# Patient Record
Sex: Female | Born: 1995 | Race: White | Hispanic: No | Marital: Single | Smoking: Never smoker
Health system: Southern US, Community
[De-identification: ages and names within clinical notes are randomized; demographics above are authoritative.]

## PROBLEM LIST (undated history)

## (undated) DIAGNOSIS — M419 Scoliosis, unspecified: Secondary | ICD-10-CM

## (undated) DIAGNOSIS — R509 Fever, unspecified: Secondary | ICD-10-CM

## (undated) DIAGNOSIS — G40909 Epilepsy, unspecified, not intractable, without status epilepticus: Secondary | ICD-10-CM

## (undated) DIAGNOSIS — Z151 Genetic susceptibility to epilepsy and neurodevelopmental disorders: Secondary | ICD-10-CM

## (undated) DIAGNOSIS — Z931 Gastrostomy status: Secondary | ICD-10-CM

## (undated) DIAGNOSIS — S42309A Unspecified fracture of shaft of humerus, unspecified arm, initial encounter for closed fracture: Secondary | ICD-10-CM

## (undated) DIAGNOSIS — R569 Unspecified convulsions: Secondary | ICD-10-CM

## (undated) DIAGNOSIS — R634 Abnormal weight loss: Secondary | ICD-10-CM

## (undated) DIAGNOSIS — J189 Pneumonia, unspecified organism: Secondary | ICD-10-CM

## (undated) DIAGNOSIS — F842 Rett's syndrome: Secondary | ICD-10-CM

## (undated) HISTORY — DX: Rett's syndrome: F84.2

## (undated) HISTORY — DX: Unspecified fracture of shaft of humerus, unspecified arm, initial encounter for closed fracture: S42.309A

## (undated) HISTORY — DX: Unspecified convulsions: R56.9

## (undated) HISTORY — PX: SPINE SURGERY: SHX786

## (undated) HISTORY — DX: Genetic susceptibility to epilepsy and neurodevelopmental disorders: Z15.1

## (undated) HISTORY — DX: Gastrostomy status: Z93.1

## (undated) HISTORY — PX: BACK SURGERY: SHX140

## (undated) HISTORY — PX: GASTROSTOMY TUBE PLACEMENT: SHX655

## (undated) HISTORY — DX: Fever, unspecified: R50.9

## (undated) HISTORY — DX: Abnormal weight loss: R63.4

## (undated) HISTORY — DX: Epilepsy, unspecified, not intractable, without status epilepticus: G40.909

## (undated) HISTORY — DX: Pneumonia, unspecified organism: J18.9

## (undated) HISTORY — DX: Scoliosis, unspecified: M41.9

## (undated) HISTORY — PX: SPINAL GROWTH RODS: SHX2427

---
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
## 2015-02-15 ENCOUNTER — Ambulatory Visit (INDEPENDENT_AMBULATORY_CARE_PROVIDER_SITE_OTHER): Payer: Medicaid Other | Admitting: Pediatrics

## 2015-02-15 ENCOUNTER — Encounter: Payer: Self-pay | Admitting: Pediatrics

## 2015-02-15 VITALS — Wt 83.0 lb

## 2015-02-15 DIAGNOSIS — F842 Rett's syndrome: Secondary | ICD-10-CM | POA: Diagnosis not present

## 2015-02-15 DIAGNOSIS — Z79899 Other long term (current) drug therapy: Secondary | ICD-10-CM | POA: Diagnosis not present

## 2015-02-15 DIAGNOSIS — G479 Sleep disorder, unspecified: Secondary | ICD-10-CM | POA: Diagnosis not present

## 2015-02-15 DIAGNOSIS — Z72820 Sleep deprivation: Secondary | ICD-10-CM | POA: Diagnosis not present

## 2015-02-15 DIAGNOSIS — R569 Unspecified convulsions: Secondary | ICD-10-CM

## 2015-02-15 NOTE — Progress Notes (Addendum)
Subjective:     Patient ID: Jo Matthews, female   DOB: 1996-09-26, 19 y.o.   MRN: [id number]  HPI Clonazepam 0.25 mg tab TID Divalproex 3 caps AM, 4 caps Lunch, 4 caps at night Lacosamide 200 mg BID Levitiracetam 1500 mg BID Diazepam 12.5 mg rectal gel as needed  Like a sleep apnea patient Oxygen order at night Seems over-medicated Seizure quality/type has changed (jerk, slump, stare, seems to exhaust her) "Like an electrical storm in her brain, but less physical movement" Seems to be having more episode of seizure activity than before medication changes  Review of Systems  Constitutional: Positive for activity change. Negative for fever.  Respiratory: Negative.   Gastrointestinal: Negative.   Neurological: Positive for seizures.     Objective:   Physical Exam Deferred Though did make observation throughout encounter that when patient nodded off to sleep (repeatedly) she would slump with her head down and to her left.  After a short period of time, her pulse Ox reading would begin to fall into the mid to upper 80's.  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    Plan:     Referral to Dr. Vickey Hugerohmeier for home sleep study, possible sleep apnea     Our office will call once initial arrangements are made Referral to other Neurologist for second opinion on medication regiment and plan     Research your preference, I will also look into the options     When you have made a decision, we can make the official referral  Need more of a focus on Palliative care (introduced idea of [facility], need to think at tertiary level as well)  Increase in Vimpat on  02/14/15 (use the following to assess response to change in dose) 1. Frequency of seizures 2. Intensity of seizures 3. Duration of seizure 4. Baseline mental status in between 5. Pattern of pulse ox measurements, use of increased O2 flow  Statement regarding patient's mental status as pertains to her feeding needs and status: Due to her baseline altered mental status secondary to Rett's, poorly controlled seizure disorder, and fact that she is made even sleepier by ant-epileptic medications, it is NOT safe for Jo Burtonmily to eat by mouth.  To feed her by mouth places her at high risk of aspiration of food (secondary to baseline dysphagia secondary to Rett's and other factors listed above) with subsequent pneumonia.  Such events are potentially fatal for such patients.  As a result, it is strongly recommended that Jo Burtonmily continue to feed by G-tube only.  Total time = 38 minutes, >50% face to face

## 2015-02-15 NOTE — Patient Instructions (Signed)
Referral to Dr. Vickey Hugerohmeier for home sleep study, possible sleep apnea     Our office will call once initial arrangements are made Referral to other Neurologist for second opinion on medication regiment and plan     Research your preference, I will also look into the options     When you have made a decision, we can make the official referral  Need more of a focus on Palliative care  Increase in Vimpat on 02/14/15: 1. Frequency of seizures 2. Intensity 3. Duration 4. Baseline in between 5. Pattern of pulse ox measurements, use of increased O2 flow

## 2015-02-16 DIAGNOSIS — Z79899 Other long term (current) drug therapy: Secondary | ICD-10-CM | POA: Insufficient documentation

## 2015-02-17 NOTE — Addendum Note (Signed)
Addended by: Saul FordyceLOWE, Kylen Ismael M on: 02/17/2015 12:17 PM   Modules accepted: Orders

## 2015-02-19 ENCOUNTER — Emergency Department (HOSPITAL_COMMUNITY)
Admission: EM | Admit: 2015-02-19 | Discharge: 2015-02-19 | Disposition: A | Discharge status: Home / Self Care | Payer: Medicaid Other | Attending: Emergency Medicine | Admitting: Emergency Medicine

## 2015-02-19 ENCOUNTER — Emergency Department (HOSPITAL_COMMUNITY): Payer: Medicaid Other

## 2015-02-19 ENCOUNTER — Encounter (HOSPITAL_COMMUNITY): Payer: Self-pay | Admitting: Emergency Medicine

## 2015-02-19 DIAGNOSIS — Z8739 Personal history of other diseases of the musculoskeletal system and connective tissue: Secondary | ICD-10-CM | POA: Diagnosis not present

## 2015-02-19 DIAGNOSIS — G40909 Epilepsy, unspecified, not intractable, without status epilepticus: Secondary | ICD-10-CM | POA: Insufficient documentation

## 2015-02-19 DIAGNOSIS — Z8659 Personal history of other mental and behavioral disorders: Secondary | ICD-10-CM | POA: Diagnosis not present

## 2015-02-19 DIAGNOSIS — Z79899 Other long term (current) drug therapy: Secondary | ICD-10-CM | POA: Diagnosis not present

## 2015-02-19 DIAGNOSIS — Z8701 Personal history of pneumonia (recurrent): Secondary | ICD-10-CM | POA: Diagnosis not present

## 2015-02-19 DIAGNOSIS — Z880 Allergy status to penicillin: Secondary | ICD-10-CM | POA: Diagnosis not present

## 2015-02-19 MED ORDER — LORAZEPAM 1 MG PO TABS: 0.5000 mg | ORAL_TABLET | Freq: Two times a day (BID) | ORAL | Status: DC

## 2015-02-19 NOTE — ED Provider Notes (Signed)
CSN: [id number]     Arrival date & time 02/19/15  1044 History   First MD Initiated Contact with Patient 02/19/15 1103     Chief Complaint  Patient presents with  . Seizures     (Consider location/radiation/quality/duration/timing/severity/associated sxs/prior Treatment) HPI The patient has a severe, chronic seizure disorder. She was admitted to [facility] for observation 4 days ago. This morning she was noted to be moving her hand in a somewhat rhythmic pattern and not as alert as her baseline. The quality of her seizures have changed since being started on Vimpat per her mother and nurse provider. She used to have generalized tonic clonic seizures however now they've become much more subtle due to the medications. She has not had a fever as of the past several days. Her mother reports however she does have periodic fever and association with her underlying neurologic dysfunction. This has been occurring sporadically since 2013. They deny history of recurrent pneumonia or UTI. She did just have diagnostic studies done during her admission and reportedly this was negative. Her mother reports her baseline function is approximately that of a 28-month-old infant. She is nonverbal and nonambulatory. Past Medical History  Diagnosis Date  . Rett's syndrome   . Seizure disorder   . G tube feedings   . Scoliosis   . Seizures   . Weight loss   . Fracture, humerus closed   . Recurrent fever of unknown cause 08/31/2012    Fever to 104-105 lasting 24 hrs, occuring once a week for the past 5 weeks (onset Sept 2013)  . Pneumonia    Past Surgical History  Procedure Laterality Date  . Gastrostomy tube placement    . Spinal growth rods    . Back surgery     No family history on file. History  Substance Use Topics  . Smoking status: Never Smoker   . Smokeless tobacco: Never Used  . Alcohol Use: Not on file   OB History    No data available     Review of Systems   Patient cannot provide  review of systems. Per the mother and the care provider there have not been significant changes in terms of coughing, vomiting, diarrhea or skin rash. Allergies  Onfi and Penicillins  Home Medications   Prior to Admission medications   Medication Sig Start Date End Date Taking? Authorizing Provider  acetaminophen (TYLENOL) 160 MG/5ML suspension Take 560 mg by mouth every 6 (six) hours as needed for moderate pain.   Yes Historical Provider, MD  aluminum-magnesium hydroxide-simethicone (MAALOX) 200-200-20 MG/5ML SUSP Take 20 mLs by mouth every other day as needed.   Yes Historical Provider, MD  cetirizine (ZYRTEC) 1 MG/ML syrup Take 10 mLs by mouth daily. 02/08/15  Yes Historical Provider, MD  clonazePAM (KLONOPIN) 0.25 MG disintegrating tablet Take 1 tablet by mouth 3 (three) times daily. 02/01/15  Yes Historical Provider, MD  diazepam (DIASAT) 20 MG GEL Place 12.5 mg rectally daily as needed (seizure). For seizures greater than 5 minutes 02/14/15  Yes Historical Provider, MD  divalproex (DEPAKOTE SPRINKLE) 125 MG capsule Take 3 caps in the morning, 3 capsules at lunch, and 4 capsules in the evening Patient taking differently: Take 375 mg by mouth every morning. Take 3 caps in the morning at 8 am. 01/09/14  Yes Preston Fleeting, MD  divalproex (DEPAKOTE) 500 MG DR tablet Take 2,000 mg by mouth 2 (two) times daily. Take 4 capsules (2000 mg) at 1 pm and Take 4 capsules (  2000 mg) at 8pm.   Yes Historical Provider, MD  guaifenesin (ROBITUSSIN) 100 MG/5ML syrup Take 300 mg by mouth 3 (three) times daily as needed for cough.   Yes Historical Provider, MD  lacosamide (VIMPAT) 200 MG TABS tablet Place 200 mg into feeding tube 2 (two) times daily. 02/14/15  Yes Historical Provider, MD  levETIRAcetam (KEPPRA) 100 MG/ML solution Place 1,500 mg into feeding tube 2 (two) times daily.  10/24/13  Yes Historical Provider, MD  mupirocin cream (BACTROBAN) 2 % Apply 1 application topically daily as needed (irritation).     Yes Historical Provider, MD  nystatin cream (MYCOSTATIN) Apply topically 2 (two) times daily. Apply thin layer each diaper change as needed for diaper rash until rash gone 02/15/14  Yes Preston Fleeting, MD  Pediatric Multivit-Minerals-C (CHEWABLES MULTIVITAMIN) CHEW Chew 1 tablet by mouth daily.   Yes Historical Provider, MD  polyethylene glycol powder (GLYCOLAX/MIRALAX) powder Take 17 g by mouth daily. Patient taking differently: Take 8.5 g by mouth daily.  02/24/14  Yes Preston Fleeting, MD  sodium chloride (OCEAN) 0.65 % SOLN nasal spray Place 1 spray into both nostrils as needed for congestion.   Yes Historical Provider, MD  LORazepam (ATIVAN) 1 MG tablet Take 0.5 tablets (0.5 mg total) by mouth 2 (two) times daily. 02/19/15   Arby Barrette, MD  Multiple Vitamins-Minerals (MULTIVITAL) CHEW Chew 1 tablet by mouth every morning. 09/09/13 12/04/14  Georgiann Hahn, MD   BP 132/88 mmHg  Pulse 100  Resp 14  SpO2 98%  LMP  Physical Exam  Constitutional:  The patient has the appearance of developmental anomaly. Her extremities are atrophic and have some extension contracture of the lower extremity and flexion contracture of the hands. She is not in any respiratory distress. Her color is good.  HENT:  Head: Normocephalic and atraumatic.  Nose: Nose normal.  Mouth/Throat: No oropharyngeal exudate.  Eyes: EOM are normal. Pupils are equal, round, and reactive to light.  Neck: Neck supple.  Cardiovascular: Normal rate, regular rhythm, normal heart sounds and intact distal pulses.   Pulmonary/Chest: Effort normal and breath sounds normal.  Abdominal: Soft. Bowel sounds are normal. She exhibits no distension. There is no tenderness.  Musculoskeletal: She exhibits no edema or tenderness.  Neurological:  The patient has a profoundly abnormal neurologic examination. She does not appear to be having an active seizure during examination. She is slightly sleepy. She occasionally makes vocalizations. He cannot  follow any commands. Extremities indicate limited purposeful control with some ability to move the upper extremities about however the hands are contracted. The lower extremities are shortened and have extension contractures of the feet.  Skin: Skin is warm and dry.    ED Course  Procedures (including critical care time) Labs Review Labs Reviewed  COMPREHENSIVE METABOLIC PANEL  CBC WITH DIFFERENTIAL/PLATELET  URINALYSIS, ROUTINE W REFLEX MICROSCOPIC  VALPROIC ACID LEVEL  LEVETIRACETAM LEVEL    Imaging Review Dg Chest 1 View  02/19/2015   CLINICAL DATA:  Hypoxia.  Rett syndrome  EXAM: CHEST  1 VIEW  COMPARISON:  December 07, 2014  FINDINGS: There is no edema or consolidation. Heart size and pulmonary vascularity are normal. No adenopathy. There is thoracolumbar dextroscoliosis with fixation rod placement present.  IMPRESSION: No edema or consolidation.   Electronically Signed   By: Bretta Bang III M.D.   On: 02/19/2015 12:27     EKG Interpretation None       Recheck 14:55 the patient is at her baseline. The  parents are feeding her and interacting. She is awake and smiles and gazes about at family members. Consultation: Patient's case was reviewed with [facility] Shelda [facility]tesPopely at [facility]. He is familiar with the patient and has been involved with her care. At this point with the patient not having any active seizure and back to baseline, he suggested addition of Ativan 0.5 mg twice daily for 3 days. The office will be in contact with the parents regarding ongoing diagnostic studies and coordination of care with a neurologist specializing in the patient's condition who is in [place]. 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 She does have extensive neurology consultation and management. [facility] was [facility]. Today the seizure activity sounds  fairly subtle by description. Due to this fact the neurologist that the family is working with in [place] suggests a continuous outpatient monitoring EEG. I have discussed this with Dr. Shelda AltesPopely and the plan will be for [facility] to establish direct contact with the [facility] specialist and moved towards this plan of continuous EEG monitoring. At this time however, per discussion with Dr. Shelda AltesPopely, the patient is appropriate for discharge as she is back at baseline and ongoing outpatient evaluation and diagnostic testing.   Arby BarretteMarcy Gionni Vaca, MD 02/19/15 1515

## 2015-02-19 NOTE — ED Notes (Addendum)
Per EMS: pt had 45 min seizure per home health nurse, post-ictal one EMS arrival. CBG 96. Gave Diastat at 0854.

## 2015-02-19 NOTE — Discharge Instructions (Signed)

## 2015-02-19 NOTE — ED Notes (Signed)
Nurse is going to get labs.

## 2015-02-19 NOTE — ED Notes (Signed)
Bed: [id number] Expected date:  Expected time:  Means of arrival:  Comments: Seizure x "45 mins"

## 2015-02-19 NOTE — ED Notes (Signed)
Unable to get labs through IV, CN made aware and ultrasound will be attempted.

## 2015-02-19 NOTE — ED Notes (Signed)
Mother states urinalysis was just done on Tuesday and was negative. Does not want child to be cath again.

## 2015-02-27 ENCOUNTER — Telehealth: Payer: Self-pay | Admitting: Pediatrics

## 2015-02-27 NOTE — Telephone Encounter (Signed)
Mom needs to talk to you about a report she is looking for.

## 2015-03-09 ENCOUNTER — Other Ambulatory Visit: Payer: Self-pay | Admitting: Pediatrics

## 2015-03-19 ENCOUNTER — Institutional Professional Consult (permissible substitution): Payer: Medicaid Other | Admitting: Neurology

## 2015-03-22 ENCOUNTER — Telehealth: Payer: Self-pay

## 2015-03-22 NOTE — Telephone Encounter (Signed)
Mother called stating that patient is having an odd eye movement. Mother denied any other symptoms. Per Home Nurse patients vitals were good. Per dr .Ane Paymenthooker informed mother , its concerning for seizures but if vitals are not dipping it may just be an odd behavior. Per dr.hooker keep an eye on patient and call neurologist to make sure theres nothing else that we have to do.

## 2015-03-22 NOTE — Telephone Encounter (Signed)
Agree with advice as given.

## 2015-04-03 ENCOUNTER — Telehealth: Payer: Self-pay | Admitting: Pediatrics

## 2015-04-03 NOTE — Telephone Encounter (Signed)
[facility] needs documents for need of supplies for G tube to continue Fax to [phone]

## 2015-04-04 NOTE — Telephone Encounter (Signed)
Crystal, can you ask Dr. Ardyth Manam or Larita FifeLynn to take care of this request for me?  I am not going to be in the office today because Valentina GuLucy will be having a bronchoscopy [facility] in [place] to see if an aspirated foreign body has been the cause of her prolonged respiratory problems.  I am also not going to be in Thursday or Friday due to [employer]her's stepfather[place].  I am really sorry to inconvenience everyone, but I don't think this request can wait until I get back in the office.  Thanks.

## 2015-04-09 ENCOUNTER — Encounter: Payer: Self-pay | Admitting: Pediatrics

## 2015-04-10 ENCOUNTER — Institutional Professional Consult (permissible substitution): Payer: Medicaid Other | Admitting: Neurology

## 2015-04-17 ENCOUNTER — Telehealth: Payer: Self-pay | Admitting: Pediatrics

## 2015-04-17 NOTE — Telephone Encounter (Signed)
Needs notes to support continue need of supplies faxed to [phone]

## 2015-04-24 ENCOUNTER — Institutional Professional Consult (permissible substitution): Payer: Self-pay | Admitting: Neurology

## 2015-04-30 ENCOUNTER — Other Ambulatory Visit: Payer: Self-pay | Admitting: Pediatrics

## 2015-05-08 ENCOUNTER — Other Ambulatory Visit: Payer: Self-pay | Admitting: Pediatrics

## 2015-05-09 ENCOUNTER — Other Ambulatory Visit: Payer: Self-pay | Admitting: Pediatrics

## 2015-05-09 MED ORDER — POLYETHYLENE GLYCOL 3350 17 GM/SCOOP PO POWD: 17.0000 g | Freq: Every day | ORAL | Status: DC

## 2015-07-11 ENCOUNTER — Telehealth: Payer: Self-pay | Admitting: Pediatrics

## 2015-07-11 NOTE — Telephone Encounter (Signed)
Form filled

## 2015-09-30 DIAGNOSIS — K5909 Other constipation: Secondary | ICD-10-CM | POA: Insufficient documentation

## 2015-09-30 DIAGNOSIS — F79 Unspecified intellectual disabilities: Secondary | ICD-10-CM | POA: Insufficient documentation

## 2015-09-30 DIAGNOSIS — K219 Gastro-esophageal reflux disease without esophagitis: Secondary | ICD-10-CM | POA: Insufficient documentation

## 2015-11-21 DIAGNOSIS — R4701 Aphasia: Secondary | ICD-10-CM | POA: Insufficient documentation

## 2015-11-27 ENCOUNTER — Other Ambulatory Visit: Payer: Self-pay | Admitting: Pediatrics

## 2015-12-17 ENCOUNTER — Ambulatory Visit (INDEPENDENT_AMBULATORY_CARE_PROVIDER_SITE_OTHER): Payer: Medicaid Other | Admitting: Pediatrics

## 2015-12-17 VITALS — BP 110/66 | Ht <= 58 in | Wt 86.0 lb

## 2015-12-17 DIAGNOSIS — F842 Rett's syndrome: Secondary | ICD-10-CM | POA: Diagnosis not present

## 2015-12-17 DIAGNOSIS — Z00129 Encounter for routine child health examination without abnormal findings: Secondary | ICD-10-CM

## 2015-12-17 NOTE — Patient Instructions (Signed)

## 2015-12-18 ENCOUNTER — Encounter: Payer: Self-pay | Admitting: Pediatrics

## 2015-12-18 DIAGNOSIS — Z00129 Encounter for routine child health examination without abnormal findings: Secondary | ICD-10-CM | POA: Insufficient documentation

## 2015-12-18 NOTE — Progress Notes (Signed)
Now being followed by a [place] specialist on Rett's syndrome. Therapy Clonidine was discontinued Depakote TID Keppra BID Diazepam PRN  Seizures ---not well controlled Pneumonia a couple years ago G tube for meds only Feeds orally  Wheelchair bound Sleeps a lot  Needs _--PT/OT. Wants to start on speech Uses diapers Needs an EKG ordered.  Subjective:    Jo Matthews is a 20 y.o. female who presents for annual check up.  She has a GMFCS level of II.  History of Present Illness Main concerns today are routine check.  Birth History  Vitals  . Birth    Weight: 7 lb 2 oz (3.232 kg)    Developmental History Head/Trunk control: delayed (poor) Speech: delayed (moderate-severe) Swallowing issues: appropriate for age Hand function: delayed (poor ) Mobility: wheelchair  Function: Mobility: manual wheelchair Pain concerns: no Hand function:  Right: reduced dexterity  Left: reduced dexterity Spine curvature: has rod in place Swallowing: normal Toileting: dependent Community activities: very little  Equipment: AFOs, bath chair, hand/wrist splint(s) and wheelchair  Review of Systems: Vision: impaired Hearing: impaired Seizures: yes - not well controlled Constipation: no GE reflux: no Fractures: no  The following portions of the patient's history were reviewed and updated as appropriate: allergies, current medications, past family history, past medical history, past social history, past surgical history and problem list.  Family/Social History Living arrangements: with family     Objective:    Physical Exam BP 110/66 mmHg  Ht 4' 7.5" (1.41 m)  Wt 86 lb (39.009 kg)  BMI 19.62 kg/m2 Cognition: non-interactive Respiratory: normal, no increased effort Upper extremity function:  Right: decreased  Left: decreased    Abdomen: soft, non tender, no masses--g tube in situ Spine scoliosis: has rods in place  Sitting Ability: assisted Gait: none     Assessment:      20 year old female with developmental delay/ Mental retardation  Retts syndrome      Plan:    1. Gross motor: continue wheelhair 2. Fine motor/ADL: continue PT/OT 3. Educational/vocational: will start on speech 4. Transition skills: none 5. Speech/swallowing: normal 6. Orthopedics/bracing: yes 7. Other equipment: special bed 8: Social: personal care

## 2016-01-01 ENCOUNTER — Telehealth: Payer: Self-pay | Admitting: Pediatrics

## 2016-01-01 NOTE — Telephone Encounter (Signed)
Mom wants to know if you can call in a RX for Diastat called in to [facility] please

## 2016-01-01 NOTE — Telephone Encounter (Signed)
Refilled 01/01/16 x 4

## 2016-03-06 ENCOUNTER — Ambulatory Visit (INDEPENDENT_AMBULATORY_CARE_PROVIDER_SITE_OTHER): Payer: Medicaid Other | Admitting: Pediatrics

## 2016-03-06 VITALS — Wt 88.5 lb

## 2016-03-06 DIAGNOSIS — L210 Seborrhea capitis: Secondary | ICD-10-CM

## 2016-03-06 DIAGNOSIS — L659 Nonscarring hair loss, unspecified: Secondary | ICD-10-CM | POA: Diagnosis not present

## 2016-03-06 DIAGNOSIS — L630 Alopecia (capitis) totalis: Secondary | ICD-10-CM | POA: Diagnosis not present

## 2016-03-06 MED ORDER — KETOCONAZOLE 2 % EX SHAM: 1.0000 "application " | MEDICATED_SHAMPOO | CUTANEOUS | Status: AC

## 2016-03-06 NOTE — Patient Instructions (Signed)
Seborrheic Dermatitis Seborrheic dermatitis involves pink or red skin with greasy, flaky scales. It usually occurs on the scalp, and it is often called dandruff. This condition may also affect the eyebrows, nose, ears, chest, and the bearded area of men's faces. It often occurs where skin has more oil (sebaceous) glands. It may come and go for no known reason, and it is often long-lasting (chronic). CAUSES The cause is not known. RISK FACTORS This condition is more like to develop in:  People who are stressed or tired.  People who have skin conditions, such as acne.  People who have certain conditions, such as:  HIV (human immunodeficiency virus).  AIDS (acquired immunodeficiency syndrome).  Parkinson disease.  An eating disorder.  Stroke.  Depression.  Epilepsy.  Alcoholism.  People who live in places that have extreme weather.  People who have a family history of seborrheic dermatitis.  People who use skin creams that are made with alcohol.  People who are 30-60 years old.  People who take certain medicines. SYMPTOMS Symptoms of this condition include:  Thick scales on the scalp.  Redness on the face or in the armpits.  Skin that is flaky. The flakes may be white or yellow.  Skin that seems oily or dry but is not helped with moisturizers.  Itching or burning in the affected areas. DIAGNOSIS This condition is diagnosed with a medical history and physical exam. A sample of your skin may be tested (skin biopsy). You may need to see a skin specialist (dermatologist). TREATMENT There is no cure for this condition, but treatment can help to manage the symptoms. Treatment may include:  Cortisone (steroid) ointments, creams, and lotions.  Over-the-counter or prescription shampoos. HOME CARE INSTRUCTIONS  Apply over-the-counter and prescription medicines only as told by your health care provider.  Keep all follow-up visits as told by your health care provider.  This is important.  Try to reduce your stress, such as with yoga or mediation. If you need help to reduce stress, ask your health care provider.  Shower or bathe as told by your health care provider.  Use any medicated shampoos as told by your health care provider. SEEK MEDICAL CARE IF:  Your symptoms do not improve with treatment.  Your symptoms get worse.  You have new symptoms.   This information is not intended to replace advice given to you by your health care provider. Make sure you discuss any questions you have with your health care provider.   Document Released: 11/03/2005 Document Revised: 07/25/2015 Document Reviewed: 03/21/2015 Elsevier Interactive Patient Education 2016 Elsevier Inc.  

## 2016-03-07 ENCOUNTER — Encounter: Payer: Self-pay | Admitting: Pediatrics

## 2016-03-07 DIAGNOSIS — L210 Seborrhea capitis: Secondary | ICD-10-CM | POA: Insufficient documentation

## 2016-03-07 NOTE — Progress Notes (Signed)
20 year old with developmental delay and Seizures presents with scaly rash to scalp for the past few weeks nowtassociated with hair loss.. Started as one to two lesions but began spreading and became multiple lesions to scalp. No fever, no discharge, no swelling and no lymph nodes swelling.  Review of Systems  Constitutional: Negative. Negative for fever, activity change and appetite change.  HENT: Negative. Negative for ear pain, congestion and rhinorrhea.  Eyes: Negative.  Respiratory: Negative. Negative for cough and wheezing.  Cardiovascular: Negative.  Gastrointestinal: Negative.  Musculoskeletal: Negative. Negative for myalgias, joint swelling and gait problem.  Neurological: Negative for numbness.  Hematological: Negative for adenopathy. Does not bruise/bleed easily.    Objective:    Physical Exam  Constitutional: Baseline mental status.  Active and in no distress.  HENT:  Right Ear: Tympanic membrane normal.  Left Ear: Tympanic membrane normal.  Nose: No nasal discharge.  Mouth/Throat: Mucous membranes are moist. No tonsillar exudate. Oropharynx is clear. Pharynx is normal.  Eyes: Pupils are equal, round, and reactive to light.  Cardiovascular: Regular rhythm. No murmur heard.  Pulmonary/Chest: Effort normal. No respiratory distress. She exhibits no retraction.  Abdominal: Soft. Bowel sounds are normal. She exhibits no distension. .  Neurological: baseline mental status Skin: Skin is warm. Scaly dry rash to scalp without any hair loss.. No swelling, no erythema and no discharge.   Assessment:    Seborrhea capitis  Plan:    Will treat with topical nizoral shampoo and follow as needed.  Follow up in 4 weeks.

## 2016-04-04 ENCOUNTER — Telehealth: Payer: Self-pay

## 2016-04-04 DIAGNOSIS — B350 Tinea barbae and tinea capitis: Secondary | ICD-10-CM

## 2016-04-04 MED ORDER — GRISEOFULVIN MICROSIZE 500 MG PO TABS: 500.0000 mg | ORAL_TABLET | Freq: Every day | ORAL | Status: DC

## 2016-04-04 MED ORDER — SELENIUM SULFIDE 2.5 % EX LOTN: 1.0000 "application " | TOPICAL_LOTION | CUTANEOUS | Status: DC

## 2016-04-04 NOTE — Telephone Encounter (Signed)
Jo Matthews's home health nurse called and stated that the medication that was prescribed for Jo Matthews's cradle cap is not working and would like to talk to you about another treatment.  The phone number is the house number and the home health nurse will answer.

## 2016-04-04 NOTE — Addendum Note (Signed)
Addended by: Saul FordyceLOWE, CRYSTAL M on: 04/04/2016 03:20 PM   Modules accepted: Orders

## 2016-04-04 NOTE — Telephone Encounter (Signed)
No response to Nizoral shampoo and seems to be getting worse---will change to selenium sulfide shampoo and also oral griseofulvin for possible tinea capitis superimposed on seborrhea.  Will also refer to dermatology for further care in case this treatment does not work.

## 2016-04-17 ENCOUNTER — Encounter: Payer: Self-pay | Admitting: Pediatrics

## 2016-04-17 ENCOUNTER — Ambulatory Visit (INDEPENDENT_AMBULATORY_CARE_PROVIDER_SITE_OTHER): Payer: Medicaid Other | Admitting: Pediatrics

## 2016-04-17 VITALS — Wt 89.0 lb

## 2016-04-17 DIAGNOSIS — L03119 Cellulitis of unspecified part of limb: Secondary | ICD-10-CM

## 2016-04-17 MED ORDER — MUPIROCIN 2 % EX OINT
1.0000 | TOPICAL_OINTMENT | Freq: Two times a day (BID) | CUTANEOUS | Status: AC
Start: 2016-04-17 — End: 2016-04-24

## 2016-04-17 MED ORDER — CLINDAMYCIN HCL 300 MG PO CAPS: 300.0000 mg | ORAL_CAPSULE | Freq: Three times a day (TID) | ORAL | Status: AC

## 2016-04-17 NOTE — Progress Notes (Signed)
Subjective:    Jo Matthews is a 20 y.o. female who presents for evaluation of a possible skin infection located in the right axilla. Symptoms include 2 firm erythematous papules without drianage in the right axilla. Patient denies chills and fever greater than 100. Precipitating event: probably shaving. Treatment to date has included none with no relief.  The following portions of the patient's history were reviewed and updated as appropriate: allergies, current medications, past family history, past medical history, past social history, past surgical history and problem list.  Review of Systems Pertinent items are noted in HPI.     Objective:    Skin: 2 erythematous, firm papules with the right axilla, no drainage/discharge     Assessment:    Cellulitis of the right axilla.    Plan:    Bactroban ointment and Clindamycin PO prescribed. Agricultural engineerducational material distributed. Follow up if no improvement after 4 days of antibiotic

## 2016-04-17 NOTE — Patient Instructions (Addendum)
Bactroban ointment- two times a day for 2 weeks Clindamycin- 1 capsul (via g-tube or PO) three times a day for 10 days Antibiotic may cause diarrhea- add probiotic to daily routine if develops diarrhea Fiber gummy daily in place of daily Miralax  Cellulitis Cellulitis is an infection of the skin and the tissue beneath it. The infected area is usually red and tender. Cellulitis occurs most often in the arms and lower legs.  CAUSES  Cellulitis is caused by bacteria that enter the skin through cracks or cuts in the skin. The most common types of bacteria that cause cellulitis are staphylococci and streptococci. SIGNS AND SYMPTOMS   Redness and warmth.  Swelling.  Tenderness or pain.  Fever. DIAGNOSIS  Your health care provider can usually determine what is wrong based on a physical exam. Blood tests may also be done. TREATMENT  Treatment usually involves taking an antibiotic medicine. HOME CARE INSTRUCTIONS   Take your antibiotic medicine as directed by your health care provider. Finish the antibiotic even if you start to feel better.  Keep the infected arm or leg elevated to reduce swelling.  Apply a warm cloth to the affected area up to 4 times per day to relieve pain.  Take medicines only as directed by your health care provider.  Keep all follow-up visits as directed by your health care provider. SEEK MEDICAL CARE IF:   You notice red streaks coming from the infected area.  Your red area gets larger or turns dark in color.  Your bone or joint underneath the infected area becomes painful after the skin has healed.  Your infection returns in the same area or another area.  You notice a swollen bump in the infected area.  You develop new symptoms.  You have a fever. SEEK IMMEDIATE MEDICAL CARE IF:   You feel very sleepy.  You develop vomiting or diarrhea.  You have a general ill feeling (malaise) with muscle aches and pains.   This information is not intended to  replace advice given to you by your health care provider. Make sure you discuss any questions you have with your health care provider.   Document Released: 08/13/2005 Document Revised: 07/25/2015 Document Reviewed: 01/19/2012 Elsevier Interactive Patient Education [employer]2016 Elsevier Inc.

## 2016-05-04 ENCOUNTER — Other Ambulatory Visit: Payer: Self-pay | Admitting: Pediatrics

## 2016-06-26 ENCOUNTER — Other Ambulatory Visit: Payer: Self-pay | Admitting: Pediatrics

## 2016-09-09 ENCOUNTER — Telehealth: Payer: Self-pay | Admitting: Pediatrics

## 2016-09-09 DIAGNOSIS — R634 Abnormal weight loss: Secondary | ICD-10-CM

## 2016-09-09 NOTE — Telephone Encounter (Signed)
Okay to refer to nutritionist?  

## 2016-09-09 NOTE — Telephone Encounter (Signed)
Mother states patient was [place] specialist and has lost some weight. Doctor is concerned and would like her seen by a nutritionist.

## 2016-09-11 ENCOUNTER — Other Ambulatory Visit: Payer: Self-pay | Admitting: Pediatrics

## 2016-10-02 ENCOUNTER — Telehealth: Payer: Self-pay | Admitting: Pediatrics

## 2016-10-02 NOTE — Telephone Encounter (Signed)
Form filled

## 2016-10-03 ENCOUNTER — Encounter: Payer: Medicaid Other | Attending: Pediatrics | Admitting: Skilled Nursing Facility1

## 2016-10-03 ENCOUNTER — Encounter: Payer: Self-pay | Admitting: Skilled Nursing Facility1

## 2016-10-03 DIAGNOSIS — Z681 Body mass index (BMI) 19 or less, adult: Secondary | ICD-10-CM | POA: Insufficient documentation

## 2016-10-03 DIAGNOSIS — Z713 Dietary counseling and surveillance: Secondary | ICD-10-CM | POA: Insufficient documentation

## 2016-10-03 DIAGNOSIS — R634 Abnormal weight loss: Secondary | ICD-10-CM | POA: Diagnosis not present

## 2016-10-03 NOTE — Progress Notes (Signed)
  Medical Nutrition Therapy:  Appt start time: 10:00 end time: 10:30   Assessment:  Primary concerns today: referred for wt loss. Pts mother states the pt has Lost 6 pounds in the last couple months. Pts mother states it is a Struggle to get her to eat now when she used to love to eat, pts mother states Medication changes may have resulted in appetite change. Pts mother and nurse states No functional issues no dysphagia, No asparation. Pts mother states they Use the G-Tube for medications and fluids not food.Pts mother states the pt was down to 40 pounds so the tube was placed. Pts mother states the pt Does not like cold stuff. room temperature for liquids, ate breakfast but inconsistant, fluids 8 ounces of water bolus 3 times a day, 120 ml for breakfast. Pts mother states they are working on getting a  SLP/OT.   Estimated energy needs: 2000 calories 225 g carbohydrates 150 g protein 56 g fat  Progress Towards Goal(s):  In progress.   Nutritional Diagnosis:  [place]-3.2 Unintentional weight loss As related to lack of appetite .  As evidenced by wt loss of 6 pounds and mothers report.    Intervention:  Nutrition counseling for wt loss. Dietitian educated the pts mother on starting the appropriate process to begin tube feeding and using oral feeds as pleasure/gut function and using the G-Tube for calories. Dietitian also educated the pt on the need for a OT and SLP.  Goals: Talk to your doctor about G-Tube formulary   Getting a Dietitian through your supply company  [facility] could work too  Kefir in the yogurt aisle or a probiotic with different cultures in it  Over the counter Vitamin D is fine  Teaching Method Utilized:  Visual Auditory Hands on  Handouts given during visit include:  Many on High calorie/high protein diets as well as proper feeding  Demonstrated degree of understanding via:  Teach Back   Monitoring/Evaluation:  Dietary intake, exercise, and body weight  prn.

## 2016-10-03 NOTE — Patient Instructions (Addendum)
Talk to your doctor about G-Tube formulary   Getting a Dietitian through your supply company  [facility] could work too  Kefir in the yogurt aisle or a probiotic with different cultures in it  Over the counter Vitamin D is fine

## 2016-12-12 ENCOUNTER — Ambulatory Visit (INDEPENDENT_AMBULATORY_CARE_PROVIDER_SITE_OTHER): Payer: Medicaid Other | Admitting: Pediatrics

## 2016-12-12 VITALS — Temp 99.0°F | Wt 80.0 lb

## 2016-12-12 DIAGNOSIS — B9789 Other viral agents as the cause of diseases classified elsewhere: Secondary | ICD-10-CM | POA: Diagnosis not present

## 2016-12-12 DIAGNOSIS — J069 Acute upper respiratory infection, unspecified: Secondary | ICD-10-CM | POA: Diagnosis not present

## 2016-12-12 NOTE — Progress Notes (Signed)
Subjective:    Jo Matthews is a 21 y.o. old female here with her mother for Fever and Nasal Congestion .    HPI: Jo Matthews presents with history of fever yesterday of 99.7.  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  Cough also started yesterday afternoon.  This morning temp 100.7.  She took motrin after and brought down temp.  Increased congestion during this time.  She seems to be sleeping more.  She is nonverbal nad she is not complaining about it.  Denies any rashes, V/D, SOB, wheezing.     Review of Systems Pertinent items are noted in HPI.   Allergies: Allergies  Allergen Reactions  . Onfi [Clobazam]     Severe side effects  . Penicillins Shortness Of Breath     Current Outpatient Prescriptions on File Prior to Visit  Medication Sig Dispense Refill  . acetaminophen (TYLENOL) 160 MG/5ML suspension Take 560 mg by mouth every 6 (six) hours as needed for moderate pain.    Marland Kitchen aluminum-magnesium hydroxide-simethicone (MAALOX) 200-200-20 MG/5ML SUSP Take 20 mLs by mouth every other day as needed.    . cetirizine (ZYRTEC) 1 MG/ML syrup TAKE BY MOUTH EVERY DAY 300 mL 5  . clonazePAM (KLONOPIN) 0.25 MG disintegrating tablet Take 1 tablet by mouth 3 (three) times daily.  5  . diazepam (DIASAT) 20 MG GEL Place 12.5 mg rectally daily as needed (seizure). For seizures greater than 5 minutes    . divalproex (DEPAKOTE SPRINKLE) 125 MG capsule Take 3 caps in the morning, 3 capsules at lunch, and 4 capsules in the evening (Patient taking differently: Take 375 mg by mouth every morning. Take 3 caps in the morning at 8 am.) 300 capsule 3  . divalproex (DEPAKOTE) 500 MG DR tablet Take 2,000 mg by mouth 2 (two) times daily. Take 4 capsules (2000 mg) at 1 pm and Take 4 capsules (2000 mg) at 8pm.    . griseofulvin (GRIFULVIN V) 500 MG tablet TAKE 1 TABLET BY MOUTH EVERY DAY 30 tablet 1  . guaifenesin (ROBITUSSIN) 100 MG/5ML syrup Take 300 mg by mouth 3 (three) times daily as needed for cough.    .  lacosamide (VIMPAT) 200 MG TABS tablet Place 200 mg into feeding tube 2 (two) times daily.    Marland Kitchen levETIRAcetam (KEPPRA) 100 MG/ML solution Place 1,500 mg into feeding tube 2 (two) times daily.     Marland Kitchen LORazepam (ATIVAN) 1 MG tablet Take 0.5 tablets (0.5 mg total) by mouth 2 (two) times daily. 3 tablet 0  . Multiple Vitamins-Minerals (MULTIVITAL) CHEW Chew 1 tablet by mouth every morning. 30 tablet 12  . mupirocin cream (BACTROBAN) 2 % Apply 1 application topically daily as needed (irritation).     . nystatin cream (MYCOSTATIN) Apply topically 2 (two) times daily. Apply thin layer each diaper change as needed for diaper rash until rash gone 30 g 10  . Pediatric Multivit-Minerals-C (CHEWABLES MULTIVITAMIN) CHEW Chew 1 tablet by mouth daily.    . polyethylene glycol powder (GLYCOLAX/MIRALAX) powder TAKE 8.5 G BY MOUTH DAILY 527 g 12  . polyethylene glycol powder (GLYCOLAX/MIRALAX) powder TAKE 17 GRAMS BY MOUTH DAILY. 527 g 4  . selenium sulfide (SELSUN) 2.5 % shampoo Apply 1 application topically 2 (two) times a week. 118 mL 12  . sodium chloride (OCEAN) 0.65 % SOLN nasal spray Place 1 spray into both nostrils as needed for congestion.     No current facility-administered medications on file prior to visit.  History and Problem List: Past Medical History:  Diagnosis Date  . Fracture, humerus closed   . G tube feedings (HCC)   . Pneumonia   . Recurrent fever of unknown cause 08/31/2012   Fever to 104-105 lasting 24 hrs, occuring once a week for the past 5 weeks (onset Sept 2013)  . Rett's syndrome   . Scoliosis   . Seizure disorder (HCC)   . Seizures (HCC)   . Weight loss     Patient Active Problem List   Diagnosis Date Noted  . Seborrhea capitis 03/07/2016  . Well child check 12/18/2015  . Polypharmacy 02/16/2015  . Required emergent intubation   . Acute respiratory failure, unspecified whether with hypoxia or hypercapnia (HCC)   . 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 Altered growth and development 02/23/2014  . Muscle spasticity 12/27/2013  . Personal history of other specified conditions presenting hazards to health 09/28/2013  . Lack of adequate sleep 09/21/2013  . Dyssomnia 09/21/2013  . Pre-menstrual syndrome 07/27/2013  . 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 Seizures (HCC) 09/19/2011        Objective:    Temp 99 F (37.2 C)   Wt 80 lb (36.3 kg)   BMI 18.26 kg/m   General: alert, active, cooperative, non toxic, wheelchair bound, non verbal ENT: oropharynx moist, no lesions, nares no discharge Eye:  PERRL, EOMI, conjunctivae clear, no discharge Ears: TM clear/intact bilateral, no discharge Neck: supple, no sig LAD Lungs: clear to auscultation, no wheeze, crackles or retractions Heart: RRR, Nl S1, S2, no murmurs Abd: soft, non tender, non distended, normal BS, no organomegaly, no masses appreciated Skin: no rashes Neuro: normal mental status, No focal deficits  No results found for this or any previous visit (from the past 2160 hour(s)).     Assessment:   [name] is a 21 y.o. old female with  1. Viral upper respiratory tract infection     Plan:   1.  Discuss supportive care for viral illness.  Viral colds can last 7-10 days, smoke exposure can exacerbate and lengthen symptoms. Monitor for worsening symptoms or continued fever.  If no imporvement or worsening return in 2-3 days.     2.  Discussed to return for worsening symptoms or further concerns.    Patient's Medications  New Prescriptions   No medications on file  Previous Medications   ACETAMINOPHEN (TYLENOL) 160 MG/5ML SUSPENSION    Take 560 mg by mouth every 6 (six) hours as needed for moderate pain.   ALUMINUM-MAGNESIUM HYDROXIDE-SIMETHICONE (MAALOX) 200-200-20 MG/5ML SUSP    Take 20 mLs by mouth every other day as needed.   CETIRIZINE (ZYRTEC) 1 MG/ML SYRUP     TAKE BY MOUTH EVERY DAY   CLONAZEPAM (KLONOPIN) 0.25 MG DISINTEGRATING TABLET    Take 1 tablet by mouth 3 (three) times daily.   DIAZEPAM (DIASAT) 20 MG GEL    Place 12.5 mg rectally daily as needed (seizure). For seizures greater than 5 minutes   DIVALPROEX (DEPAKOTE SPRINKLE) 125 MG CAPSULE    Take 3 caps in the morning, 3 capsules at lunch, and 4 capsules in the evening   DIVALPROEX (DEPAKOTE) 500 MG DR TABLET    Take 2,000 mg by mouth 2 (two) times daily. Take 4 capsules (2000 mg) at 1 pm and Take 4 capsules (2000 mg) at 8pm.   GRISEOFULVIN (GRIFULVIN V) 500  MG TABLET    TAKE 1 TABLET BY MOUTH EVERY DAY   GUAIFENESIN (ROBITUSSIN) 100 MG/5ML SYRUP    Take 300 mg by mouth 3 (three) times daily as needed for cough.   LACOSAMIDE (VIMPAT) 200 MG TABS TABLET    Place 200 mg into feeding tube 2 (two) times daily.   LEVETIRACETAM (KEPPRA) 100 MG/ML SOLUTION    Place 1,500 mg into feeding tube 2 (two) times daily.    LORAZEPAM (ATIVAN) 1 MG TABLET    Take 0.5 tablets (0.5 mg total) by mouth 2 (two) times daily.   MULTIPLE VITAMINS-MINERALS (MULTIVITAL) CHEW    Chew 1 tablet by mouth every morning.   MUPIROCIN CREAM (BACTROBAN) 2 %    Apply 1 application topically daily as needed (irritation).    NYSTATIN CREAM (MYCOSTATIN)    Apply topically 2 (two) times daily. Apply thin layer each diaper change as needed for diaper rash until rash gone   PEDIATRIC MULTIVIT-MINERALS-C (CHEWABLES MULTIVITAMIN) CHEW    Chew 1 tablet by mouth daily.   POLYETHYLENE GLYCOL POWDER (GLYCOLAX/MIRALAX) POWDER    TAKE 8.5 G BY MOUTH DAILY   POLYETHYLENE GLYCOL POWDER (GLYCOLAX/MIRALAX) POWDER    TAKE 17 GRAMS BY MOUTH DAILY.   SELENIUM SULFIDE (SELSUN) 2.5 % SHAMPOO    Apply 1 application topically 2 (two) times a week.   SODIUM CHLORIDE (OCEAN) 0.65 % SOLN NASAL SPRAY    Place 1 spray into both nostrils as needed for congestion.  Modified Medications   No medications on file  Discontinued Medications   No  medications on file     No Follow-up on file. in 2-3 days  Myles GipPerry Scott Shannen Vernon, DO

## 2016-12-12 NOTE — Patient Instructions (Signed)
Upper Respiratory Infection, Adult Most upper respiratory infections (URIs) are caused by a virus. A URI affects the nose, throat, and upper air passages. The most common type of URI is often called "the common cold." Follow these instructions at home:  Take medicines only as told by your doctor.  Gargle warm saltwater or take cough drops to comfort your throat as told by your doctor.  Use a warm mist humidifier or inhale steam from a shower to increase air moisture. This may make it easier to breathe.  Drink enough fluid to keep your pee (urine) clear or pale yellow.  Eat soups and other clear broths.  Have a healthy diet.  Rest as needed.  Go back to work when your fever is gone or your doctor says it is okay.  You may need to stay home longer to avoid giving your URI to others.  You can also wear a face mask and wash your hands often to prevent spread of the virus.  Use your inhaler more if you have asthma.  Do not use any tobacco products, including cigarettes, chewing tobacco, or electronic cigarettes. If you need help quitting, ask your doctor. Contact a doctor if:  You are getting worse, not better.  Your symptoms are not helped by medicine.  You have chills.  You are getting more short of breath.  You have brown or red mucus.  You have yellow or brown discharge from your nose.  You have pain in your face, especially when you bend forward.  You have a fever.  You have puffy (swollen) neck glands.  You have pain while swallowing.  You have white areas in the back of your throat. Get help right away if:  You have very bad or constant:  Headache.  Ear pain.  Pain in your forehead, behind your eyes, and over your cheekbones (sinus pain).  Chest pain.  You have long-lasting (chronic) lung disease and any of the following:  Wheezing.  Long-lasting cough.  Coughing up blood.  A change in your usual mucus.  You have a stiff neck.  You have  changes in your:  Vision.  Hearing.  Thinking.  Mood. This information is not intended to replace advice given to you by your health care provider. Make sure you discuss any questions you have with your health care provider. Document Released: 04/21/2008 Document Revised: 07/06/2016 Document Reviewed: 02/08/2014 Elsevier Interactive Patient Education  2017 Elsevier Inc.  

## 2016-12-15 ENCOUNTER — Encounter: Payer: Self-pay | Admitting: Pediatrics

## 2017-01-06 ENCOUNTER — Ambulatory Visit (INDEPENDENT_AMBULATORY_CARE_PROVIDER_SITE_OTHER): Payer: Medicaid Other | Admitting: Pediatrics

## 2017-01-06 DIAGNOSIS — Z0000 Encounter for general adult medical examination without abnormal findings: Secondary | ICD-10-CM | POA: Diagnosis not present

## 2017-01-06 DIAGNOSIS — Z931 Gastrostomy status: Secondary | ICD-10-CM

## 2017-01-06 DIAGNOSIS — F842 Rett's syndrome: Secondary | ICD-10-CM | POA: Diagnosis not present

## 2017-01-06 DIAGNOSIS — G40109 Localization-related (focal) (partial) symptomatic epilepsy and epileptic syndromes with simple partial seizures, not intractable, without status epilepticus: Secondary | ICD-10-CM | POA: Diagnosis not present

## 2017-01-06 NOTE — Progress Notes (Signed)
  Subjective:     Jo Matthews is a 21 y.o. female and is here for a comprehensive physical exam. The patient reports no problems.  Social History   Social History  . Marital status: Single    Spouse name: N/A  . Number of children: N/A  . Years of education: N/A   Occupational History  . Not on file.   Social History Main Topics  . Smoking status: Never Smoker  . Smokeless tobacco: Never Used  . Alcohol use Not on file  . Drug use: Unknown  . Sexual activity: Not on file   Other Topics Concern  . Not on file   Social History Narrative  . No narrative on file   Health Maintenance  Topic Date Due  . INFLUENZA VACCINE  02/14/2017 (Originally 06/17/2016)  . HIV Screening  01/09/2018 (Originally 08/10/2011)  . TETANUS/TDAP  01/14/2018 (Originally 08/31/2017)    The following portions of the patient's history were reviewed and updated as appropriate: allergies, current medications, past family history, past medical history, past social history, past surgical history and problem list.  Review of Systems Pertinent items are noted in HPI.   Objective:    There were no vitals taken for this visit. General appearance: no distress Head: Normocephalic, without obvious abnormality Eyes: negative Ears: normal TM's and external ear canals both ears Nose: no discharge Neck: no adenopathy and supple, symmetrical, trachea midline Lungs: clear to auscultation bilaterally Heart: normal apical impulse Pelvic: deferred Extremities: full cast to both legs due to fractures Pulses: 2+ and symmetric Skin: Skin color, texture, turgor normal. No rashes or lesions Lymph nodes: Cervical, supraclavicular, and axillary nodes normal. Neurologic: Baseline mental status   Assessment:     Female with significant neurological delay   Plan:     Offered MCV and VZV #2 --mom refused  Orthopedics--[facility]  [place] longer seeing doctor in [place]  Therapy--PT/OT and Speech  Medications--done by  yneurology--Dr Popoli--[facility]  G--Tubes---not followed by GI at present  [facility]ist [facility]  Nutritionist---none  Just medication through G tube and some nutritional shake  Seizures at baseline--diastat to control See After Visit Summary for Counseling Recommendations

## 2017-01-06 NOTE — Patient Instructions (Signed)
Seizure, Adult °When you have a seizure: °· Parts of your body may move. °· How aware or awake (conscious) you are may change. °· You may shake (convulse). ° °Some people have symptoms right before a seizure happens. These symptoms may include: °· Fear. °· Worry (anxiety). °· Feeling like you are going to throw up (nausea). °· Feeling like the room is spinning (vertigo). °· Feeling like you saw or heard something before (deja vu). °· Odd tastes or smells. °· Changes in vision, such as seeing flashing lights or spots. ° °Seizures usually last from 30 seconds to 2 minutes. Usually, they are not harmful unless they last a long time. °Follow these instructions at home: °Medicines °· Take over-the-counter and prescription medicines only as told by your doctor. °· Avoid anything that may keep your medicine from working, such as alcohol. °Activity °· Do not do any activities that would be dangerous if you had another seizure, like driving or swimming. Wait until your doctor approves. °· If you live in the [place] ask your local DMV (department of motor vehicles) when you can drive. °· Rest. °Teaching others °· Teach friends and family what to do when you have a seizure. They should: °? Lay you on the ground. °? Protect your head and body. °? Loosen any tight clothing around your neck. °? Turn you on your side. °? Stay with you until you are better. °? Not hold you down. °? Not put anything in your mouth. °? Know whether or not you need emergency care. °General instructions °· Contact your doctor each time you have a seizure. °· Avoid anything that gives you seizures. °· Keep a seizure diary. Write down: °? What you think caused each seizure. °? What you remember about each seizure. °· Keep all follow-up visits as told by your doctor. This is important. °Contact a doctor if: °· You have another seizure. °· You have seizures more often. °· There is any change in what happens during your seizures. °· You continue to have  seizures with treatment. °· You have symptoms of being sick or having an infection. °Get help right away if: °· You have a seizure: °? That lasts longer than 5 minutes. °? That is different than seizures you had before. °? That makes it harder to breathe. °? After you hurt your head. °· After a seizure, you cannot speak or use a part of your body. °· After a seizure, you are confused or have a bad headache. °· You have two or more seizures in a row. °· You are having seizures more often. °· You do not wake up right after a seizure. °· You get hurt during a seizure. °In an emergency: °· These symptoms may be an emergency. Do not wait to see if the symptoms will go away. Get medical help right away. Call your local emergency services (911 in the [place] Do not drive yourself to the hospital. °This information is not intended to replace advice given to you by your health care provider. Make sure you discuss any questions you have with your health care provider. °Document Released: 04/21/2008 Document Revised: 07/16/2016 Document Reviewed: 07/16/2016 °Elsevier Interactive Patient Education © 2017 Elsevier Inc. ° °

## 2017-01-08 ENCOUNTER — Encounter: Payer: Self-pay | Admitting: Pediatrics

## 2017-01-08 DIAGNOSIS — Z0000 Encounter for general adult medical examination without abnormal findings: Secondary | ICD-10-CM | POA: Insufficient documentation

## 2017-02-16 ENCOUNTER — Other Ambulatory Visit: Payer: Self-pay | Admitting: Pediatrics

## 2017-05-25 ENCOUNTER — Telehealth: Payer: Self-pay | Admitting: Pediatrics

## 2017-05-25 NOTE — Telephone Encounter (Signed)
Form from [facility] [employer] placed on desk

## 2017-06-13 ENCOUNTER — Ambulatory Visit: Payer: Medicaid Other | Admitting: Pediatrics

## 2017-07-10 ENCOUNTER — Ambulatory Visit (INDEPENDENT_AMBULATORY_CARE_PROVIDER_SITE_OTHER): Payer: Medicaid Other | Admitting: Pediatrics

## 2017-07-10 DIAGNOSIS — S60522A Blister (nonthermal) of left hand, initial encounter: Secondary | ICD-10-CM | POA: Diagnosis not present

## 2017-07-10 MED ORDER — SILVER SULFADIAZINE 1 % EX CREA: 1.0000 "application " | TOPICAL_CREAM | Freq: Every day | CUTANEOUS | 2 refills | Status: DC

## 2017-07-10 NOTE — Patient Instructions (Signed)
Bullous Pemphigoid  Bullous pemphigoid is a skin disease that causes blisters to form. It ranges in severity and can last for a long time. The disease can come back months or years after it goes away.  What are the causes?  The cause of this condition is not known. Certain medicines and conditions, such as diabetes and multiple sclerosis, may be causes. Bullous pemphigoid is an autoimmune disease. This means that the body's own defense system (immune system) attacks the body.  What increases the risk?  This condition is more likely to develop in people over the age of 50.  What are the signs or symptoms?  This condition causes blisters to form on the skin. In mild cases, only a few small blisters form. In severe cases, many large blisters form in several areas of the body. The most common places blisters form are the groin, armpits, trunk, thighs, and forearms. About one third of people with this condition develop blisters in the mouth. The blisters may break open, forming ulcers.  Other symptoms of this condition include:   Redness.   Irritation.   Itching.   Bleeding gums.   Difficulty eating.   Cough.   Pain with swallowing.   Nosebleeds.    In most people, the symptoms of bullous pemphigoid go away within 5 years.  How is this diagnosed?  This condition may be diagnosed with a physical exam and blood tests. A procedure in which a skin sample is taken for testing (skin biopsy) may be done to confirm the diagnosis.  How is this treated?  This condition may be managed with medicines, such as:   Antibiotic medicines.   Steroid medicines. These may be applied to the skin, taken by mouth, or given as injections.   Medicines that suppress the immune system.    If the mouth or lips are affected, a change in diet may also be recommended. People with severe symptoms may need to be treated at a hospital, where they may receive:   Care for their ulcers.   Medicines given through an IV tube.   Feedings given  through an IV tube. This may be done if the mouth or lips are affected.    Follow these instructions at home:   Take medicines only as directed by your health care provider.   Keep your skin clean.   Do not scratch, break, or drain your blisters. Doing so can cause them to become infected.   Follow your health care provider's instructions about bandage (dressing) changes and removal.   If your mouth or lips are affected:  ? Eat a diet made up of soft foods and liquids only.  ? Avoid drinking very hot liquids.  Contact a health care provider if:   Your itching or pain is not helped by medicine.   You develop redness, swelling, or pain that extends beyond your blisters or ulcers.   There is pus coming from a blister or ulcer.   You have a fever.   You have confusion.   You feel unusually tired or weak.  Get help right away if:   Your pain becomes severe.   You cannot eat or drink because of blisters, ulcers, or pain in your lips or mouth.   You cannot care for yourself because of blisters, ulcers, or pain in your hands or in the soles of your feet.  This information is not intended to replace advice given to you by your health care provider. Make sure   you discuss any questions you have with your health care provider.  Document Released: 08/31/2007 Document Revised: 04/10/2016 Document Reviewed: 10/30/2014  Elsevier Interactive Patient Education  2018 Elsevier Inc.

## 2017-07-11 ENCOUNTER — Encounter: Payer: Self-pay | Admitting: Pediatrics

## 2017-07-11 DIAGNOSIS — S60522D Blister (nonthermal) of left hand, subsequent encounter: Secondary | ICD-10-CM | POA: Insufficient documentation

## 2017-07-11 NOTE — Progress Notes (Signed)
Subjective:    Jo Matthews is a 21 y.o. female with Special needs--Cerebral palsy/delayed development/spastic quadriplegia who presents for evaluation of a possible blisters located left hand and fingers. Dad found her this morning with her left hand trapped under her back with her fingers in her diapers and trapped in the plastic of the diaper. Dad noticed blisters to the fingers and hand that was trapped inside the diaper.  The following portions of the patient's history were reviewed and updated as appropriate: allergies, current medications, past family history, past medical history, past social history, past surgical history and problem list.  Review of Systems Pertinent items are noted in HPI.     Objective:    There were no vitals taken for this visit. General appearance: cooperative and no distress Ears: normal TM's and external ear canals both ears Nose: Nares normal. Septum midline. Mucosa normal. No drainage or sinus tenderness. Lungs: clear to auscultation bilaterally Heart: regular rate and rhythm, S1, S2 normal, no murmur, click, rub or gallop Skin: erythema - hand(s) left and blisters to thumb/index/ring and middle fingers and palm Neurologic: Mental status: baseline     Assessment:   Blisters to left hand and fingers secondary to pressure sores   Plan:    silverdene prescribed. Agricultural engineer distributed. Wound cleansed. Wound debrided. Wound dressing applied. Blisters were drained with a sterile needle with each blister dressed with silverdene and sterile dressing    Twice daily silverdene and dry dressing Monitor for signs of infection and follow closely if any erythema or swelling.

## 2017-07-12 ENCOUNTER — Emergency Department (HOSPITAL_COMMUNITY)
Admission: EM | Admit: 2017-07-12 | Discharge: 2017-07-12 | Disposition: A | Discharge status: Home / Self Care | Payer: Medicaid Other | Attending: Emergency Medicine | Admitting: Emergency Medicine

## 2017-07-12 ENCOUNTER — Encounter (HOSPITAL_COMMUNITY): Payer: Self-pay

## 2017-07-12 ENCOUNTER — Telehealth: Payer: Self-pay | Admitting: Pediatrics

## 2017-07-12 DIAGNOSIS — X58XXXA Exposure to other specified factors, initial encounter: Secondary | ICD-10-CM | POA: Diagnosis not present

## 2017-07-12 DIAGNOSIS — S60522D Blister (nonthermal) of left hand, subsequent encounter: Secondary | ICD-10-CM

## 2017-07-12 DIAGNOSIS — G809 Cerebral palsy, unspecified: Secondary | ICD-10-CM | POA: Diagnosis not present

## 2017-07-12 DIAGNOSIS — Z79899 Other long term (current) drug therapy: Secondary | ICD-10-CM | POA: Diagnosis not present

## 2017-07-12 NOTE — ED Provider Notes (Signed)
[facility]-EMERGENCY DEPT Provider Note   CSN: [id number] Arrival date & time: 07/12/17  1919     History   Chief Complaint Chief Complaint  Patient presents with  . Blister    HPI Jo Matthews is a 21 y.o. female.  HPI  21 year old female with a history of cerebral palsy who was evaluated by her primary care provider 2 days ago for left hand blisters was referred today for reevaluation of the hand blisters. Mom reported seeing the blisters for the first time today as she was out of town when they first presented. She reports asking her husband about the blisters, and he was unsure if the blisters on the palm had been there previously. The mother called the primary care provider, who discussed the case with Dr. Onalee Hua from [facility]. She recommended having the patient be evaluated for signs of superimposed infection and need for possible debridement. Mother denies any fevers, associated redness, discharge. There have been placed in Silvadene on the hand and currently says that the patient cannot put her hand in her mouth.   Remainder of history, ROS, and physical exam limited due to patient's condition (CP). Additional information was obtained from family.   Level V Caveat.   Past Medical History:  Diagnosis Date  . Fracture, humerus closed   . G tube feedings (HCC)   . Pneumonia   . Recurrent fever of unknown cause 08/31/2012   Fever to 104-105 lasting 24 hrs, occuring once a week for the past 5 weeks (onset Sept 2013)  . Rett's syndrome   . Scoliosis   . Seizure disorder (HCC)   . Seizures (HCC)   . Weight loss     Patient Active Problem List   Diagnosis Date Noted  . Hand blister, left, initial encounter 07/11/2017  . Well adult on routine health check 01/08/2017  . Seborrhea capitis 03/07/2016  . Well child check 12/18/2015  . Polypharmacy 02/16/2015  . Required emergent intubation   . Acute respiratory failure, unspecified whether with hypoxia or hypercapnia (HCC)   . 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 Gastrostomy in place [facility]) 02/23/2014  . Altered growth and development 02/23/2014  . Muscle spasticity 12/27/2013  . Personal history of other specified conditions presenting hazards to health 09/28/2013  . Lack of adequate sleep 09/21/2013  . Dyssomnia 09/21/2013  . Pre-menstrual syndrome 07/27/2013  . 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 Seizures (HCC) 09/19/2011    Past Surgical History:  Procedure Laterality Date  . BACK SURGERY    . GASTROSTOMY TUBE PLACEMENT    . SPINAL GROWTH RODS      OB History    No data available       Home Medications    Prior to Admission medications   Medication Sig Start Date End Date Taking? Authorizing Provider  acetaminophen (TYLENOL) 160 MG/5ML suspension Take 560 mg by mouth every 6 (six) hours as needed for moderate pain.    [provider]  aluminum-magnesium hydroxide-simethicone (MAALOX) 200-200-20 MG/5ML SUSP Take 20 mLs by mouth every other day as needed.    [provider]  cetirizine (ZYRTEC) 1 MG/ML syrup TAKE BY MOUTH EVERY DAY 02/16/17   Georgiann Hahn, MD  clonazePAM (KLONOPIN) 0.25 MG disintegrating tablet Take 1 tablet by mouth 3 (three) times daily. 02/01/15   [provider]  diazepam (DIASAT) 20 MG GEL Place 12.5 mg rectally daily as needed (seizure). For seizures  greater than 5 minutes 02/14/15   [provider]  divalproex (DEPAKOTE SPRINKLE) 125 MG capsule Take 3 caps in the morning, 3 capsules at lunch, and 4 capsules in the evening Patient taking differently: Take 375 mg by mouth every morning. Take 3 caps in the morning at 8 am. 01/09/14   Preston Fleeting, MD  divalproex (DEPAKOTE) 500 MG DR tablet Take 2,000 mg by mouth 2 (two) times daily. Take 4 capsules (2000 mg) at 1 pm and Take 4 capsules (2000 mg) at 8pm.    [provider]  griseofulvin (GRIFULVIN  V) 500 MG tablet TAKE 1 TABLET BY MOUTH EVERY DAY 05/05/16   Georgiann Hahn, MD  guaifenesin (ROBITUSSIN) 100 MG/5ML syrup Take 300 mg by mouth 3 (three) times daily as needed for cough.    [provider]  lacosamide (VIMPAT) 200 MG TABS tablet Place 200 mg into feeding tube 2 (two) times daily. 02/14/15   [provider]  levETIRAcetam (KEPPRA) 100 MG/ML solution Place 1,500 mg into feeding tube 2 (two) times daily.  10/24/13   [provider]  LORazepam (ATIVAN) 1 MG tablet Take 0.5 tablets (0.5 mg total) by mouth 2 (two) times daily. 02/19/15   Arby Barrette, MD  Multiple Vitamins-Minerals (MULTIVITAL) CHEW Chew 1 tablet by mouth every morning. 09/09/13 12/04/14  Georgiann Hahn, MD  mupirocin cream (BACTROBAN) 2 % Apply 1 application topically daily as needed (irritation).     [provider]  nystatin cream (MYCOSTATIN) Apply topically 2 (two) times daily. Apply thin layer each diaper change as needed for diaper rash until rash gone 02/15/14   Preston Fleeting, MD  Pediatric Multivit-Minerals-C (CHEWABLES MULTIVITAMIN) CHEW Chew 1 tablet by mouth daily.    [provider]  polyethylene glycol powder (GLYCOLAX/MIRALAX) powder TAKE 8.5 G BY MOUTH DAILY 05/08/15   Preston Fleeting, MD  polyethylene glycol powder (GLYCOLAX/MIRALAX) powder TAKE 17 GRAMS BY MOUTH DAILY. 09/11/16   Georgiann Hahn, MD  selenium sulfide (SELSUN) 2.5 % shampoo Apply 1 application topically 2 (two) times a week. 04/04/16   Georgiann Hahn, MD  silver sulfADIAZINE (SILVADENE) 1 % cream Apply 1 application topically daily. 07/10/17   Georgiann Hahn, MD  sodium chloride (OCEAN) 0.65 % SOLN nasal spray Place 1 spray into both nostrils as needed for congestion.    [provider]    Family History Family History  Problem Relation Age of Onset  . Heart disease Other   . Hyperlipidemia Other   . Diabetes Other     Social History Social History  Substance Use  Topics  . Smoking status: Never Smoker  . Smokeless tobacco: Never Used  . Alcohol use Not on file     Allergies   Onfi [clobazam] and Penicillins   Review of Systems Review of Systems  Unable to perform ROS: Patient nonverbal     Physical Exam Updated Vital Signs BP 107/75   Pulse 79   Temp (!) 97.2 F (36.2 C) (Axillary)   Resp 16   SpO2 95%   Physical Exam  Constitutional: She appears well-developed and well-nourished. No distress.  HENT:  Head: Normocephalic and atraumatic.  Nose: Nose normal.  Eyes: Pupils are equal, round, and reactive to light. Conjunctivae and EOM are normal. Right eye exhibits no discharge. Left eye exhibits no discharge. No scleral icterus.  Neck: Normal range of motion. Neck supple.  Cardiovascular: Normal rate and regular rhythm.  Exam reveals no gallop and no friction rub.   No  murmur heard. Pulmonary/Chest: Effort normal and breath sounds normal. No stridor. No respiratory distress. She has no rales.  Abdominal: Soft. She exhibits no distension. There is no tenderness.  Musculoskeletal: She exhibits no edema or tenderness.       Hands: Multiple blisters to the left digits and palm of the hand. No surrounding erythema, purulent discharge. Do not seem to be tender to palpation.   Neurological: She is alert.  Nonverbal. Baseline contractions of bilateral hands, upper extremity, lower extremities.  Skin: Skin is warm and dry. No rash noted. She is not diaphoretic. No erythema.  Psychiatric: She has a normal mood and affect.  Vitals reviewed.    ED Treatments / Results  Labs (all labs ordered are listed, but only abnormal results are displayed) Labs Reviewed - No data to display  EKG  EKG Interpretation None       Radiology No results found.  Procedures Procedures (including critical care time)  Medications Ordered in ED Medications - No data to display   Initial Impression / Assessment and Plan / ED Course  I have  reviewed the triage vital signs and the nursing notes.  Pertinent labs & imaging results that were available during my care of the patient were reviewed by me and considered in my medical decision making (see chart for details).     Well-appearing blisters with no evidence of superimposed infection. Discussed possible debridement with the mother. We did show decision-making we chose not to pursue debridement at this time. Recommended continued Silvadene use. Close PCP follow-up in 1-3 days for reexamination. The patient is safe for discharge with strict return precautions.   Final Clinical Impressions(s) / ED Diagnoses   Final diagnoses:  Blister (nonthermal) of left hand, subsequent encounter   Disposition: Discharge  Condition: Good  I have discussed the results, Dx and Tx plan with the patient's mother who expressed understanding and agree(s) with the plan. Discharge instructions discussed at great length. The patient's mother was given strict return precautions who verbalized understanding of the instructions. No further questions at time of discharge.    New Prescriptions   No medications on file    Follow Up: Georgiann Hahn, MD [address] [place] [address] [phone]  Schedule an appointment as soon as possible for a visit  in 1-3 days for close follow up and reexamination.      Nira Conn, MD 07/12/17 2029

## 2017-07-12 NOTE — ED Notes (Signed)
ED Provider at bedside. 

## 2017-07-12 NOTE — ED Notes (Signed)
Patient needs to be seen in peds

## 2017-07-12 NOTE — Telephone Encounter (Signed)
Mom called and said that the blisters that were drained on Friday has refilled and more has popped up. She has been using the Silvadene cream as prescribed and applying sterile dressing. No fever but mom says that the hand itself is much more swollen such that she is unable to see her knuckles now.  Spoke to Peds surgery on call at [facility] who directed me to discuss the case with Dr Onalee Hua --Peds Hand surgeon and burn team on call and she advised that the lesions be de-roofed so that the fluid drains out and then apply the silvadene cream with the dressing. She said this can only wait until tomorrow if there is no signs of cellulitis or compartment syndrome.   After speaking to mom--she did not have fever but the hand is more swollen that before but still pink and she was not sure if circulation was compromised but she was not sure. Mom also said that Tynecia was much more irritable than usual.   Called the attending at the [facility] ER at [facility] and he agreed to take a look at her and debride the blisters and evaluate for cellulitis or compartment syndrome. He said although she was 21 years old he cleared it with the Charge Nurse and agreed to see her. He did state that if she needs admission he would need to admit to the adult service however.  Will follow as per ER instructions.

## 2017-07-12 NOTE — ED Triage Notes (Signed)
Pt here to have blisters on left hand debrided.  Per Dr. Barney Drain @ [facility], Peds ER agreed to see pt.

## 2017-07-13 ENCOUNTER — Encounter: Payer: Self-pay | Admitting: Pediatrics

## 2017-07-13 ENCOUNTER — Telehealth: Payer: Self-pay | Admitting: Pediatrics

## 2017-07-13 DIAGNOSIS — G8250 Quadriplegia, unspecified: Secondary | ICD-10-CM | POA: Insufficient documentation

## 2017-07-13 DIAGNOSIS — G808 Other cerebral palsy: Secondary | ICD-10-CM

## 2017-07-13 NOTE — Telephone Encounter (Signed)
Jo Matthews was seen in the ED for blisters on her hands. The care giver wants to know how she needs to take care of them please.

## 2017-07-13 NOTE — Telephone Encounter (Signed)
Spoke to nurse and advised on dry sterile dressing with silverdene twice a day. Nurse expressed understanding.

## 2017-07-16 ENCOUNTER — Ambulatory Visit (INDEPENDENT_AMBULATORY_CARE_PROVIDER_SITE_OTHER): Payer: Medicaid Other | Admitting: Pediatrics

## 2017-07-16 VITALS — HR 85

## 2017-07-16 DIAGNOSIS — R509 Fever, unspecified: Secondary | ICD-10-CM | POA: Diagnosis not present

## 2017-07-16 DIAGNOSIS — B349 Viral infection, unspecified: Secondary | ICD-10-CM | POA: Diagnosis not present

## 2017-07-16 DIAGNOSIS — S60522D Blister (nonthermal) of left hand, subsequent encounter: Secondary | ICD-10-CM | POA: Diagnosis not present

## 2017-07-16 LAB — CBC WITH DIFFERENTIAL/PLATELET
Basophils Absolute: 0 cells/uL (ref 0–200)
Basophils Relative: 0 %
EOS ABS: 258 {cells}/uL (ref 15–500)
Eosinophils Relative: 2 %
HEMATOCRIT: 35.3 % (ref 35.0–45.0)
HEMOGLOBIN: 11.6 g/dL — AB (ref 11.7–15.5)
LYMPHS ABS: 4386 {cells}/uL — AB (ref 850–3900)
Lymphocytes Relative: 34 %
MCH: 34.9 pg — ABNORMAL HIGH (ref 27.0–33.0)
MCHC: 32.9 g/dL (ref 32.0–36.0)
MCV: 106.3 fL — AB (ref 80.0–100.0)
MONO ABS: 1161 {cells}/uL — AB (ref 200–950)
MPV: 9.8 fL (ref 7.5–12.5)
Monocytes Relative: 9 %
NEUTROS ABS: 7095 {cells}/uL (ref 1500–7800)
Neutrophils Relative %: 55 %
Platelets: 165 10*3/uL (ref 140–400)
RBC: 3.32 MIL/uL — ABNORMAL LOW (ref 3.80–5.10)
RDW: 14.6 % (ref 11.0–15.0)
WBC: 12.9 10*3/uL — ABNORMAL HIGH (ref 3.8–10.8)

## 2017-07-16 MED ORDER — LORATADINE 5 MG/5ML PO SYRP: 5.0000 mg | ORAL_SOLUTION | Freq: Two times a day (BID) | ORAL | 12 refills | Status: DC

## 2017-07-16 NOTE — Patient Instructions (Signed)

## 2017-07-16 NOTE — Progress Notes (Signed)
Jo Matthews--home nurse   Subjective:     Jo Matthews is a 21 y.o. 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 Symptoms include congestion, cough described as nonproductive, low grade fever, nasal congestion and post nasal drip. Onset of symptoms was 2 days ago, and has been gradually worsening since that time. Treatment to date: decongestants.  The following portions of the patient's history were reviewed and updated as appropriate: allergies, current medications, past family history, past medical history, past social history, past surgical history and problem list.  Review of Systems Pertinent items are noted in HPI.   Objective:    Pulse 85   SpO2 96%  General appearance: cooperative and no distress Ears: normal TM's and external ear canals both ears Nose: mild congestion, turbinates pink, swollen Lungs: clear to auscultation bilaterally Heart: regular rate and rhythm, S1, S2 normal, no murmur, click, rub or gallop Extremities: left hand blistres healing well Skin: blisters to left hand healing well Neurologic: baseline mental status  Assessment:    viral upper respiratory illness   Plan:    Discussed diagnosis and treatment of URI. Discussed the importance of avoiding unnecessary antibiotic therapy. Suggested symptomatic OTC remedies. Nasal saline spray for congestion. Follow up as needed. Call in 1 days if symptoms aren't resolving. LABS--CBC, CMP and CRP now   If trouble breathing/desats tonight or worsening will order chest X ray in am Claritin 5 mg PO BID X 5 days then daily---discontinue ZYRTEC

## 2017-07-17 ENCOUNTER — Ambulatory Visit
Admission: RE | Admit: 2017-07-17 | Discharge: 2017-07-17 | Disposition: A | Discharge status: Home / Self Care | Payer: Medicaid Other | Source: Ambulatory Visit | Attending: Pediatrics | Admitting: Pediatrics

## 2017-07-17 ENCOUNTER — Telehealth: Payer: Self-pay | Admitting: Pediatrics

## 2017-07-17 ENCOUNTER — Other Ambulatory Visit: Payer: Self-pay | Admitting: Pediatrics

## 2017-07-17 ENCOUNTER — Encounter: Payer: Self-pay | Admitting: Pediatrics

## 2017-07-17 DIAGNOSIS — R059 Cough, unspecified: Secondary | ICD-10-CM

## 2017-07-17 DIAGNOSIS — R05 Cough: Secondary | ICD-10-CM

## 2017-07-17 LAB — COMPLETE METABOLIC PANEL WITH GFR
ALBUMIN: 3.3 g/dL — AB (ref 3.6–5.1)
ALT: 10 U/L (ref 6–29)
AST: 20 U/L (ref 10–30)
Alkaline Phosphatase: 57 U/L (ref 33–115)
BILIRUBIN TOTAL: 0.3 mg/dL (ref 0.2–1.2)
BUN: 11 mg/dL (ref 7–25)
CO2: 28 mmol/L (ref 20–32)
CREATININE: 0.38 mg/dL — AB (ref 0.50–1.10)
Calcium: 9 mg/dL (ref 8.6–10.2)
Chloride: 99 mmol/L (ref 98–110)
Glucose, Bld: 98 mg/dL (ref 65–99)
Potassium: 4.5 mmol/L (ref 3.5–5.3)
Sodium: 141 mmol/L (ref 135–146)
TOTAL PROTEIN: 5.8 g/dL — AB (ref 6.1–8.1)

## 2017-07-17 LAB — C-REACTIVE PROTEIN: CRP: 30 mg/L — ABNORMAL HIGH (ref <8.0)

## 2017-07-17 MED ORDER — CEFDINIR 250 MG/5ML PO SUSR: 250.0000 mg | Freq: Two times a day (BID) | ORAL | 0 refills | Status: DC

## 2017-07-17 MED ORDER — AZITHROMYCIN 200 MG/5ML PO SUSR: ORAL | 0 refills | Status: DC

## 2017-07-17 NOTE — Telephone Encounter (Signed)
Chest X ray positive for pneumonia --will call in antibiotics and follow closely. Will call and discuss with mom

## 2017-07-21 NOTE — Telephone Encounter (Signed)
Doing ok on antibiotics --will follow as needed

## 2017-07-23 ENCOUNTER — Telehealth: Payer: Self-pay | Admitting: Pediatrics

## 2017-07-23 NOTE — Telephone Encounter (Signed)
Forms filled for disability

## 2017-08-04 ENCOUNTER — Ambulatory Visit
Admission: RE | Admit: 2017-08-04 | Discharge: 2017-08-04 | Disposition: A | Discharge status: Home / Self Care | Payer: Medicaid Other | Source: Ambulatory Visit | Attending: Pediatrics | Admitting: Pediatrics

## 2017-08-04 ENCOUNTER — Telehealth: Payer: Self-pay | Admitting: Pediatrics

## 2017-08-04 DIAGNOSIS — J189 Pneumonia, unspecified organism: Secondary | ICD-10-CM

## 2017-08-04 NOTE — Telephone Encounter (Signed)
Jo Matthews mom wants to know if you can order another chest xray. She is not sure the pneumonia is gone.

## 2017-08-04 NOTE — Telephone Encounter (Signed)
Spoke to mom --ordered another chest x ray.

## 2017-08-04 NOTE — Telephone Encounter (Signed)
Called and discussed with mom--we repeated the chest X ray since she started requiring again had similar symptoms as when she had the pneumonia. X ray showed that the left lower lobe pneumonia with minimal improvement. Decision made that she would need inpatient therapy and work up. Mom to go to ER after talking with dad about helping with her to take her in. Mom expressed understanding about the need for inpatient care now that outpatient therapy--omnicef and zithromax-- failed.

## 2017-08-05 ENCOUNTER — Emergency Department (HOSPITAL_COMMUNITY)
Admission: EM | Admit: 2017-08-05 | Discharge: 2017-08-05 | Disposition: A | Discharge status: Home / Self Care | Payer: Medicaid Other | Attending: Emergency Medicine | Admitting: Emergency Medicine

## 2017-08-05 ENCOUNTER — Encounter (HOSPITAL_COMMUNITY): Payer: Self-pay

## 2017-08-05 ENCOUNTER — Emergency Department (HOSPITAL_COMMUNITY): Payer: Medicaid Other

## 2017-08-05 DIAGNOSIS — R0602 Shortness of breath: Secondary | ICD-10-CM | POA: Diagnosis present

## 2017-08-05 DIAGNOSIS — J9811 Atelectasis: Secondary | ICD-10-CM

## 2017-08-05 DIAGNOSIS — Z79899 Other long term (current) drug therapy: Secondary | ICD-10-CM | POA: Insufficient documentation

## 2017-08-05 LAB — COMPREHENSIVE METABOLIC PANEL
ALT: 14 U/L (ref 14–54)
AST: 37 U/L (ref 15–41)
Albumin: 3.2 g/dL — ABNORMAL LOW (ref 3.5–5.0)
Alkaline Phosphatase: 52 U/L (ref 38–126)
Anion gap: 13 (ref 5–15)
BUN: 8 mg/dL (ref 6–20)
CO2: 27 mmol/L (ref 22–32)
Calcium: 9.5 mg/dL (ref 8.9–10.3)
Chloride: 100 mmol/L — ABNORMAL LOW (ref 101–111)
Creatinine, Ser: 0.32 mg/dL — ABNORMAL LOW (ref 0.44–1.00)
GFR calc Af Amer: >60 mL/min (ref >60)
GFR calc non Af Amer: >60 mL/min (ref >60)
Glucose, Bld: 93 mg/dL (ref 65–99)
Potassium: 4 mmol/L (ref 3.5–5.1)
Sodium: 140 mmol/L (ref 135–145)
Total Bilirubin: 0.4 mg/dL (ref 0.3–1.2)
Total Protein: 6.3 g/dL — ABNORMAL LOW (ref 6.5–8.1)

## 2017-08-05 LAB — CBC WITH DIFFERENTIAL/PLATELET
Basophils Absolute: 0 10*3/uL (ref 0.0–0.1)
Basophils Relative: 0 %
Eosinophils Absolute: 0.1 10*3/uL (ref 0.0–0.7)
Eosinophils Relative: 1 %
HCT: 35.1 % — ABNORMAL LOW (ref 36.0–46.0)
Hemoglobin: 11.5 g/dL — ABNORMAL LOW (ref 12.0–15.0)
Lymphocytes Relative: 30 %
Lymphs Abs: 2.2 10*3/uL (ref 0.7–4.0)
MCH: 34.5 pg — ABNORMAL HIGH (ref 26.0–34.0)
MCHC: 32.8 g/dL (ref 30.0–36.0)
MCV: 105.4 fL — ABNORMAL HIGH (ref 78.0–100.0)
Monocytes Absolute: 0.5 10*3/uL (ref 0.1–1.0)
Monocytes Relative: 7 %
Neutro Abs: 4.5 10*3/uL (ref 1.7–7.7)
Neutrophils Relative %: 61 %
Platelets: 117 10*3/uL — ABNORMAL LOW (ref 150–400)
RBC: 3.33 MIL/uL — ABNORMAL LOW (ref 3.87–5.11)
RDW: 13.5 % (ref 11.5–15.5)
WBC: 7.3 10*3/uL (ref 4.0–10.5)

## 2017-08-05 LAB — D-DIMER, QUANTITATIVE: D-Dimer, Quant: 10.14 ug/mL-FEU — ABNORMAL HIGH (ref 0.00–0.50)

## 2017-08-05 MED ORDER — IOPAMIDOL (ISOVUE-370) INJECTION 76%
INTRAVENOUS | Status: AC
  Administered 2017-08-05: 60 mL
  Filled 2017-08-05: qty 100

## 2017-08-05 MED ORDER — SODIUM CHLORIDE 0.9 % IV SOLN
Freq: Once | INTRAVENOUS | Status: AC
  Administered 2017-08-05: 11:00:00 via INTRAVENOUS

## 2017-08-05 MED ORDER — SODIUM CHLORIDE 0.9 % IV BOLUS (SEPSIS)
500.0000 mL | Freq: Once | INTRAVENOUS | Status: AC
  Administered 2017-08-05: 500 mL via INTRAVENOUS

## 2017-08-05 NOTE — ED Notes (Signed)
Dr. Deis at bedside.  

## 2017-08-05 NOTE — ED Provider Notes (Signed)
[facility]ENCY DEPT Provider Note   CSN: [id number] Arrival date & time: 08/05/17  4098     History   Chief Complaint Chief Complaint  Patient presents with  . Shortness of Breath    HPI Jo Matthews is a 21 y.o. female.  Jo Matthews is a 21 year old female with a history of Rett syndrome, CP, seizure, and recent pneumonia who was referred to ED for failure of outpatient treatment of pneumonia and further work up.  Jo Matthews presented to her PCP on August 30th with URI like symptoms, chest xray was obtained and showed a left lower lobe opacity. She was diagnosed with pneumonia on Aug 31 and started on a 10 day course of omnicef and 5 days of azithromax. During that time, Jo Matthews needed 1 L oxygen on and off and seemed to need less oxygen towards the end of her course. However, for the past 3 days she has required more oxygen and yesterday she needed 1 L O2 for the entire day. She has been desatting to 87-91, mostly at night while asleep. Repeat chest xray yesterday (9/18) showed no improvement of left lower lobe pneumonia and PCP recommended she go to the ED for evaluation of inpatient therapy and further workup.  Per mom, she does not have a trach or vent. She has a G-tube and has been taking almost all of her feeds via G-tube for the past few days since she has not been wanting to eat anything orally. She has been getting Ensure. At baseline, she is nonambulatory and nonverbal and takes shallow breaths. She has had a cough but has not been able to cough with enough force to produce sputum. She has been having nonbloody diarrhea 2-3x per day. Mom notes slightly increased seizure frequency, she normally has about one seizure per day for 4-5 minutes. She is on keppra and depakote, has not missed any doses. Mom denies fever and vomiting, no rash.   Jo Matthews does not have a history of asthma or wheezing. The only medications she is currently on now are depakote, keppra, and allergy  medication.       Past Medical History:  Diagnosis Date  . Fracture, humerus closed   . G tube feedings (HCC)   . Pneumonia   . Recurrent fever of unknown cause 08/31/2012   Fever to 104-105 lasting 24 hrs, occuring once a week for the past 5 weeks (onset Sept 2013)  . Rett's syndrome   . Scoliosis   . Seizure disorder (HCC)   . Seizures (HCC)   . Weight loss     Patient Active Problem List   Diagnosis Date Noted  . 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 Hand blister, left, subsequent encounter 07/11/2017  . Well adult on routine health check 01/08/2017  . Seborrhea capitis 03/07/2016  . Well child check 12/18/2015  . Nonverbal 11/21/2015  . Chronic constipation 09/30/2015  . Gastroesophageal reflux disease without esophagitis 09/30/2015  . Mental retardation 09/30/2015  . Polypharmacy 02/16/2015  . Required emergent intubation   . Acute respiratory failure, unspecified whether with hypoxia or hypercapnia (HCC)   . 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 Gastrostomy in place [facility]) 02/23/2014  . Altered growth and development 02/23/2014  . 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 Muscle spasticity 12/27/2013  . Personal history of other specified conditions presenting hazards to health 09/28/2013  . Disturbance in sleep behavior 09/21/2013  . Dyssomnia 09/21/2013  . Acute viral syndrome 07/27/2013  . Pre-menstrual syndrome 07/27/2013  .  Fever in pediatric patient 09/01/2012  . 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 Seizures (HCC) 09/19/2011    Past Surgical History:  Procedure Laterality Date  . BACK SURGERY    . GASTROSTOMY TUBE PLACEMENT    . SPINAL GROWTH RODS      OB History    No data available       Home Medications    Prior to Admission medications   Medication Sig Start Date End Date Taking? Authorizing Provider   acetaminophen (TYLENOL) 160 MG/5ML suspension Take 560 mg by mouth every 6 (six) hours as needed for moderate pain.    [provider]  aluminum-magnesium hydroxide-simethicone (MAALOX) 200-200-20 MG/5ML SUSP Take 20 mLs by mouth every other day as needed.    [provider]  cefdinir (OMNICEF) 250 MG/5ML suspension Take 5 mLs (250 mg total) by mouth 2 (two) times daily. 07/17/17   Georgiann Hahn, MD  cetirizine (ZYRTEC) 1 MG/ML syrup TAKE BY MOUTH EVERY DAY 02/16/17   Georgiann Hahn, MD  clonazePAM (KLONOPIN) 0.25 MG disintegrating tablet Take 1 tablet by mouth 3 (three) times daily. 02/01/15   [provider]  diazepam (DIASAT) 20 MG GEL Place 12.5 mg rectally daily as needed (seizure). For seizures greater than 5 minutes 02/14/15   [provider]  divalproex (DEPAKOTE SPRINKLE) 125 MG capsule Take 3 caps in the morning, 3 capsules at lunch, and 4 capsules in the evening Patient taking differently: Take 375 mg by mouth every morning. Take 3 caps in the morning at 8 am. 01/09/14   Preston Fleeting, MD  divalproex (DEPAKOTE) 500 MG DR tablet Take 2,000 mg by mouth 2 (two) times daily. Take 4 capsules (2000 mg) at 1 pm and Take 4 capsules (2000 mg) at 8pm.    [provider]  griseofulvin (GRIFULVIN V) 500 MG tablet TAKE 1 TABLET BY MOUTH EVERY DAY 05/05/16   Georgiann Hahn, MD  guaifenesin (ROBITUSSIN) 100 MG/5ML syrup Take 300 mg by mouth 3 (three) times daily as needed for cough.    [provider]  lacosamide (VIMPAT) 200 MG TABS tablet Place 200 mg into feeding tube 2 (two) times daily. 02/14/15   [provider]  levETIRAcetam (KEPPRA) 100 MG/ML solution Place 1,500 mg into feeding tube 2 (two) times daily.  10/24/13   [provider]  loratadine (CLARITIN) 5 MG/5ML syrup Place 5 mLs (5 mg total) into feeding tube 2 (two) times daily. 07/16/17 08/16/17  Georgiann Hahn, MD  LORazepam (ATIVAN) 1 MG tablet Take 0.5  tablets (0.5 mg total) by mouth 2 (two) times daily. 02/19/15   Arby Barrette, MD  Multiple Vitamins-Minerals (MULTIVITAL) CHEW Chew 1 tablet by mouth every morning. 09/09/13 12/04/14  Georgiann Hahn, MD  mupirocin cream (BACTROBAN) 2 % Apply 1 application topically daily as needed (irritation).     [provider]  nystatin cream (MYCOSTATIN) Apply topically 2 (two) times daily. Apply thin layer each diaper change as needed for diaper rash until rash gone 02/15/14   Preston Fleeting, MD  Pediatric Multivit-Minerals-C (CHEWABLES MULTIVITAMIN) CHEW Chew 1 tablet by mouth daily.    [provider]  polyethylene glycol powder (GLYCOLAX/MIRALAX) powder TAKE 8.5 G BY MOUTH DAILY 05/08/15   Preston Fleeting, MD  polyethylene glycol powder (GLYCOLAX/MIRALAX) powder TAKE 17 GRAMS BY MOUTH DAILY. 09/11/16   Georgiann Hahn, MD  selenium sulfide (SELSUN) 2.5 % shampoo Apply 1  application topically 2 (two) times a week. 04/04/16   Georgiann Hahn, MD  silver sulfADIAZINE (SILVADENE) 1 % cream Apply 1 application topically daily. 07/10/17   Georgiann Hahn, MD  sodium chloride (OCEAN) 0.65 % SOLN nasal spray Place 1 spray into both nostrils as needed for congestion.    [provider]    Family History Family History  Problem Relation Age of Onset  . Heart disease Other   . Hyperlipidemia Other   . Diabetes Other     Social History Social History  Substance Use Topics  . Smoking status: Never Smoker  . Smokeless tobacco: Never Used  . Alcohol use Not on file     Allergies   Onfi [clobazam] and Penicillins   Review of Systems Review of Systems  Constitutional: Positive for appetite change. Negative for fever.  Respiratory: Positive for cough.   Gastrointestinal: Positive for diarrhea. Negative for vomiting.  Neurological: Positive for seizures.  All other systems reviewed and are negative.    Physical Exam Updated Vital Signs BP 124/84   Pulse 85   Temp  98.1 F (36.7 C) (Temporal)   Resp 18   SpO2 94%   Physical Exam  Constitutional: She appears well-developed and well-nourished. No distress.  Resting in bed. Nonambulatory and nonverbal with severe developmental delay. Smiled and laughed. Moving hands and spastic muscle movements throughout body  HENT:  Head: Normocephalic.  Right Ear: External ear normal.  Left Ear: External ear normal.  Nose: Nose normal.  Unable to visualize right TM due to cerumen. Left TM normal  Eyes: Conjunctivae and EOM are normal. Right eye exhibits no discharge. Left eye exhibits no discharge.  Cardiovascular: Normal rate, regular rhythm, normal heart sounds and intact distal pulses.  Exam reveals no gallop and no friction rub.   No murmur heard. Pulmonary/Chest: Effort normal. No stridor. No respiratory distress. She has no wheezes. She has no rales.  Shallow breaths at baseline, equal breath sounds throughout  Abdominal: Soft. Bowel sounds are normal. She exhibits no distension. There is no tenderness. There is no guarding.  G-tube in place  Musculoskeletal: She exhibits no edema, tenderness or deformity.  Neurological: She is alert. She exhibits abnormal muscle tone.  Skin: Skin is warm and dry. No rash noted. She is not diaphoretic. No erythema.     ED Treatments / Results  Labs (all labs ordered are listed, but only abnormal results are displayed) Labs Reviewed  CBC WITH DIFFERENTIAL/PLATELET - Abnormal; Notable for the following:       Result Value   RBC 3.33 (*)    Hemoglobin 11.5 (*)    HCT 35.1 (*)    MCV 105.4 (*)    MCH 34.5 (*)    Platelets 117 (*)    All other components within normal limits  COMPREHENSIVE METABOLIC PANEL - Abnormal; Notable for the following:    Chloride 100 (*)    Creatinine, Ser 0.32 (*)    Total Protein 6.3 (*)    Albumin 3.2 (*)    All other components within normal limits  D-DIMER, QUANTITATIVE (NOT AT [facility]) - Abnormal; Notable for the following:     D-Dimer, Quant 10.14 (*)    All other components within normal limits    EKG  EKG Interpretation None       Radiology Dg Chest 2 View  Result Date: 08/04/2017 CLINICAL DATA:  Pneumonia EXAM: CHEST  2 VIEW COMPARISON:  07/17/2017 FINDINGS: Spinal fusion hardware noted. Minimal improvement in the left  lower lobe retrocardiac opacity with central air bronchograms, compatible with resolving pneumonia. Right lung is clear. No other airspace process developing or edema. No effusion or pneumothorax. Normal bowel gas pattern. IMPRESSION: Minimal interval improvement in the left lower lobe bronchopneumonia. Electronically Signed   By: Judie Petit.  Shick M.D.   On: 08/04/2017 16:39   Ct Angio Chest Pe W/cm &/or Wo Cm  Result Date: 08/05/2017 CLINICAL DATA:  Shortness of breath. EXAM: CT ANGIOGRAPHY CHEST WITH CONTRAST TECHNIQUE: Multidetector CT imaging of the chest was performed using the standard protocol during bolus administration of intravenous contrast. Multiplanar CT image reconstructions and MIPs were obtained to evaluate the vascular anatomy. CONTRAST:  60 cc of Isovue 370 COMPARISON:  08/04/2017 FINDINGS: Cardiovascular: Normal heart size. No pericardial effusion. The main pulmonary artery is patent. No saddle embolus. No lobar or segmental pulmonary artery filling defects identified. Mediastinum/Nodes: No enlarged mediastinal lymph nodes. Borderline enlarged left hilar nodes measure up to 9 mm. Thyroid gland, trachea, and esophagus demonstrate no significant findings. Lungs/Pleura: No pleural effusions. Subsegmental atelectasis and airspace densities within the left lower lobe. Upper Abdomen: No acute abnormality. Musculoskeletal: Scoliosis rods are identified. Review of the MIP images confirms the above findings. IMPRESSION: 1. No evidence for acute pulmonary embolus. 2. The left lower lobe appears hypo ventilated and there are areas of subsegmental atelectasis and airspace consolidation which may reflect  pneumonia or resolving aspiration. 3. Borderline enlarged left hilar lymph nodes are favored to represent reactive adenopathy. Electronically Signed   By: [name] Kell M.D.   On: 08/05/2017 12:19    Procedures Procedures (including critical care time)  Medications Ordered in ED Medications  sodium chloride 0.9 % bolus 500 mL (0 mLs Intravenous Stopped 08/05/17 1211)  0.9 %  sodium chloride infusion ( Intravenous Stopped 08/05/17 1331)  iopamidol (ISOVUE-370) 76 % injection (60 mLs  Contrast Given 08/05/17 1154)     Initial Impression / Assessment and Plan / ED Course  I have reviewed the triage vital signs and the nursing notes.  Pertinent labs & imaging results that were available during my care of the patient were reviewed by me and considered in my medical decision making (see chart for details).   Kambrey is a 21 year old with severe developmental delay, Rett's syndrome, CP, and seizure who presented to the ED with pneumonia that failed outpatient treatment. She has had a cough with increased O2 requirement (1L), decreased oral intake, diarrhea, and slight increase in seizure frequency. She was diagnosed with pneumonia of left lower lobe on August 31 via chest xray and started on 10 days of omnicef and 5 days a zithromax. Repeat chest xray on 9/18 showed no improvement of left lower lobe, PCP recommended further evaluation and possible inpatient treatment. Upon arriving in ED, she was in no distress and well appearing with oxygen saturations in the high 90s. Per mom, her desats typically occur while she is asleep. Differential includes pneumonia, chronic atelectasis, lung mass, or PE. Symptoms and chest xray are consistent with a pneumonia that has failed outpatient management, however she has not had any fever. She may have chronic atelectasis since she takes shallow breaths at baseline. Due to persistent opacity, there is concern for a lung mass. She is also nonambulatory, so PE is a  possibility as well (however she has no erythema, swelling, or tenderness of calves to suggest DVT).   We will obtain CBC, BMP to evaluate for signs of infection and kidney function. CTA to evaluate for lung  mass and rule out possible PE. Will also obtain D-dimer for possible PE.  Update: Demeka remained afebrile with normal oxygen saturations, normal WBC. BMP was normal. D-dimer was elevated to 10.14. CTA showed no pulmonary embolus, and she had mucus plugging and atelectasis. 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 Mom was counseled about chest physiotherapy. No antibiotics needed at this time since she has been afebrile with normal WBC.   Kimberlynn is stable for discharge home. It was recommended she follow up with her PCP and have a repeat chest xray next week. Return precautions were discussed with mom.  Final Clinical Impressions(s) / ED Diagnoses   Final diagnoses:  Atelectasis of left lung    New Prescriptions Discharge Medication List as of 08/05/2017  1:03 PM       Aikam Vinje, Joni Reining, MD 08/05/17 1451    Ree Shay, MD 08/05/17 2202

## 2017-08-05 NOTE — ED Triage Notes (Signed)
Pt presents with mother from PCP for further evaluation of possible PNA with decreased O2 sats intermittently. Mother reports mild, dry cough. Denies fevers at home. Taking abx from PCP

## 2017-08-05 NOTE — Discharge Instructions (Signed)
CT of the chest shows areas of atelectasis in her left lower lung. See handout provided. This is called by mucous plugs and small areas of local lung collapse. It is very important that she received chest physiotherapy, chest percussion in the left lower lung at least 3 times daily. Also use her vest as well 2-3 times per day. Will hold off on further antibiotics at this time given normal blood work and absence of fever. Call your doctor in the next 1-2 days for a phone update. He plans to order a repeat chest x-ray in 1 week. Return sooner for new fever over 101, worsening condition, heavy labored breathing or new concerns.

## 2017-08-05 NOTE — ED Provider Notes (Signed)
I saw and evaluated the patient, reviewed the resident's note and I agree with the findings and plan.  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 3 weeks ago patient had new O2 requirement. Uses home oxygen only on an as-needed basis for illness. Had chest x-ray on August 31 which showed left lower lobe opacity worrisome for pneumonia. Never had fever. Cough mild and intermittent the mother reports her cough is weak and she has shallow breathing at baseline. Treated with 10 days of Omnicef and 5 days of azithromycin. Had improvement and weaned off her home oxygen but then 3 days ago again began having intermittent desaturations to the upper 80s. Mother has been using oxygen intermittently at home again. On 1 L nasal cannula since yesterday. Appetite also decreased from baseline. No vomiting. No new fever. Saw PCP again yesterday he repeated chest x-ray which showed minimal improvement in the left lower lobe opacity noted on August 31. Referred here for further evaluation and CT scan of the chest to ensure no other pulmonary process.  On exam here afebrile with normal vitals. Oxygen saturations 97% on room air on continuous pulse oximetry. TMs clear, lungs with normal work of breathing, no retractions but shallow breaths. No obvious wheezing or crackles. Abdomen benign. G-tube in place. Lower extremities without swelling or tenderness, lateral calves symmetric without swelling or tenderness.  Will place saline lock and obtain screening CBC and CMP. We'll also obtain D dimer. She has no prior history of DVT or PE but does have chronic immobility so can be considered at potential risk for this. I discussed CT chest imaging with radiology. They advise CTA of the chest which can  evaluate for PE but also give clarification on the left lower lobe process to determine if this is chronic atelectasis versus pneumonia. We'll give gentle bolus 500 ML's and continue saline infusion pending workup. No need for oxygen currently but will give oxygen by nasal cannula if she has oxygen saturations less than 92%.  D dimer elevated at 10.14. Normal WBC 7.3, normal BUN and Cr. Will proceed with CTA of the chest.  CT of the chest shows no evidence of pulmonary embolism. No evidence of pulmonary mass. She has areas of subsegmental atelectasis in the left lower lobe as well as airspace consolidation which may reflect pneumonia or resolving aspiration. She was monitored in the ED for 3 hours and has had normal oxygen saturations during that time without need for any supplemental oxygen. Work of breathing remains normal as well. Mother feels comfortable with plan for discharge as she has home health nurse at home and also pulse oximetry and supplement oxygen if needed. I spoke with patient's pediatrician, Dr. Ardyth Man, who is agreeable with this plan as well. For now, we'll focus on chest physiotherapy, chest percussion as well as use of cough last which patient already has. He will order follow-up chest x-ray in 1 week. Mother knows to bring her back sooner for any new fever over 101, labored breathing, worsening condition or new concerns.   EKG Interpretation None         Ree Shay, MD 08/05/17 1301

## 2017-08-19 ENCOUNTER — Telehealth: Payer: Self-pay | Admitting: Pediatrics

## 2017-08-28 ENCOUNTER — Encounter: Payer: Self-pay | Admitting: Pediatrics

## 2017-08-31 ENCOUNTER — Encounter: Payer: Self-pay | Admitting: Pediatrics

## 2017-09-07 NOTE — Telephone Encounter (Signed)
Letter written

## 2017-09-17 ENCOUNTER — Telehealth: Payer: Self-pay | Admitting: Pediatrics

## 2017-09-17 DIAGNOSIS — R635 Abnormal weight gain: Secondary | ICD-10-CM

## 2017-09-17 DIAGNOSIS — R1310 Dysphagia, unspecified: Secondary | ICD-10-CM

## 2017-09-17 DIAGNOSIS — R1319 Other dysphagia: Secondary | ICD-10-CM

## 2017-09-17 NOTE — Telephone Encounter (Signed)
Mom called and would like to talk to Dr Barney Drainamgoolam concerning Irving Burtonmily and her not eating.

## 2017-09-17 NOTE — Telephone Encounter (Signed)
Referrals put in epic and faxed

## 2017-09-17 NOTE — Telephone Encounter (Signed)
Mom says that she is gagging and having trouble swallowing for some time now---she was seen by the dentist who says her mouth is fine.   Will refer to ENT and Dietitian and follow as needed.

## 2017-09-21 ENCOUNTER — Ambulatory Visit
Admission: RE | Admit: 2017-09-21 | Discharge: 2017-09-21 | Disposition: A | Discharge status: Home / Self Care | Payer: Medicaid Other | Source: Ambulatory Visit | Attending: Pediatrics | Admitting: Pediatrics

## 2017-09-21 ENCOUNTER — Telehealth: Payer: Self-pay | Admitting: Pediatrics

## 2017-09-21 DIAGNOSIS — R0989 Other specified symptoms and signs involving the circulatory and respiratory systems: Secondary | ICD-10-CM

## 2017-09-21 NOTE — Telephone Encounter (Signed)
Decreased sats when doing chest vest therapy. Would order chest X ray to rule out pneumonia and follow as needed.

## 2017-09-21 NOTE — Telephone Encounter (Signed)
Per our conversation when Irving Burtonmily is having vest treatments her oxygen goes down to the 60's

## 2017-09-30 DIAGNOSIS — R1314 Dysphagia, pharyngoesophageal phase: Secondary | ICD-10-CM | POA: Insufficient documentation

## 2017-10-01 ENCOUNTER — Telehealth: Payer: Self-pay | Admitting: Pediatrics

## 2017-10-01 NOTE — Telephone Encounter (Signed)
[facility] called and [facility]. Mom said the [facility] of her feeding tube. Also Mom said she needs a GI referral.

## 2017-10-01 NOTE — Telephone Encounter (Signed)
Left message with mom to call and schedule a face to face [name]

## 2017-10-05 ENCOUNTER — Ambulatory Visit (INDEPENDENT_AMBULATORY_CARE_PROVIDER_SITE_OTHER): Payer: Medicaid Other | Admitting: Pediatrics

## 2017-10-05 VITALS — Ht <= 58 in | Wt <= 1120 oz

## 2017-10-05 DIAGNOSIS — G8250 Quadriplegia, unspecified: Secondary | ICD-10-CM | POA: Diagnosis not present

## 2017-10-05 DIAGNOSIS — J9600 Acute respiratory failure, unspecified whether with hypoxia or hypercapnia: Secondary | ICD-10-CM

## 2017-10-05 DIAGNOSIS — M62838 Other muscle spasm: Secondary | ICD-10-CM

## 2017-10-05 DIAGNOSIS — F809 Developmental disorder of speech and language, unspecified: Secondary | ICD-10-CM

## 2017-10-05 DIAGNOSIS — Z931 Gastrostomy status: Secondary | ICD-10-CM

## 2017-10-05 NOTE — Progress Notes (Signed)
History of Present Illness Main concerns today are:  1. Need to contact [name] for management of G- tube  2.Not eating all the time, nutrition shakes, need to know how much to give and when to give since when about to feed she still HAS RESIDUALS.---no GI is involved in her care--will refer to [facility] and while there will get Diettian involved with them to manage G-Tube feeds.  3. Will need to continue her physical and occupational therapy---will thus Refer to PT and OT  4. Refer to Speech ---for Evaluation for and provision of EYE GAZE equipment  5. Pulse ox dropping to 80's off and on--will send for pulmonary evaluation and apnea studies---[facility]  Discussed eye gaze equipment for home use  Developmental History Developmental delay  Function: Mobility: manual wheelchair Pain concerns: no Hand function:  Right: reduced dexterity  Left: reduced dexterity Spine curvature: mild Swallowing: normal and modified diet (pureed) Toileting: dependent   Equipment discussed and continued need agreed: AFOs, bath chair, hand/wrist splint(s), life system, G tube, Ventilator with trache, suction machine, pulse ox and oxygen, skilled nursing care,  wheelchair and RAMP for house.   Review of Systems: Vision: impaired Hearing: impaired Seizures: yes - controlled Constipation: no GE reflux: yes - to be followed by GI Fractures: no  The following portions of the patient's history were reviewed and updated as appropriate: allergies, current medications, past family history, past medical history, past social history, past surgical history and problem list.  Referrals needed-- Peds GI Peds Pulmonary PT OT Speech Dietitian   Objective:    Physical Exam Wt  70 lbs Cognition: non-interactive Respiratory: normal, no increased effort Lower extremity function:  Right: has on AFO to ankle  Left: AFO to ankle Actively wearing AFO at visit  today Abdomen: normal-- G tube present Spine scoliosis: mild  Sitting Ability: assisted Gait: wheelchair    Assessment:     Increased tone to lower extremities--needs AFO's to control foot and ankle position.     Plan:    1. Gross motor: delayed 2. Fine motor/ADL: delayd 3. Educational/vocational: home 4. Transition skills: n/a 5. Speech/swallowing: no speech, GERD 6. Orthopedics/bracing: bilateral AFO's --present today 7. Other equipment:  bath chair, hand/wrist splint(s), life system, wheelchair and RAMP for house.

## 2017-10-06 ENCOUNTER — Encounter: Payer: Self-pay | Admitting: Pediatrics

## 2017-10-06 NOTE — Patient Instructions (Signed)
Ventilator A ventilator is a machine that helps move air in and out of a person's lungs. It may be used to help a person breathe or to completely control a person's breathing. A ventilator is needed when a person is severely injured or is too weak or too tired to breathe without help. A ventilator keeps a person alive, since they would not be able breathe on their own. Ventilators have alarms that may go off, which is normal. The alarms help health care providers make sure that all connections are secure and that the ventilator is working properly. How does a ventilator work?  When a person is on a ventilator, a tube from the ventilator connects to a second tube passed into the person's mouth, nose, or throat (tracheostomy). This second tube goes down into the person's lungs. The tube delivers oxygen-rich air to the lungs and also carries away waste gases, such as carbon dioxide.  Deep in the lungs, there are groups of small air sacs (alveoli) that are covered in tiny blood vessels. This is where oxygen and carbon dioxide are exchanged with the blood stream. The oxygen and carbon dioxide are carried to and from the alveoli by the ventilator.  Ventilators are powered by electricity, and have a back-up power source in case a power outage occurs.  The air delivered by a ventilator is warm and moist. How do we know if the ventilator is delivering enough air?  Ventilators have settings that control the size of each breath and how often breaths occur. The settings are based on how a person normally breathes.  There are pressure monitors that help determine the proper size of the breath, and show that the machine is properly connected.  A display screen on the ventilator allows the health care team to frequently check that the settings are correct for the person on the ventilator.  A finger tip monitor called a pulse oximeter may be used to constantly track the person's blood oxygen level.  Blood tests  called arterial blood gases (ABGs) may be performed regularly to check the balance of gases in the person's blood. What are the risks of being on a ventilator? Depending on how long a person's breathing is assisted by a ventilator, there may be an increased risk for:  Pneumonia.  Sinus infection.  Air leakage into the space between the lungs and the chest wall (pneumothorax).  Blood clots and skin infections. These may occur due to lying in bed for a long period of time.  Damage to vocal cords.  What happens when a ventilator is no longer needed? When the person on the ventilator becomes able to breathe on their own again, they are given a breathing trial. While still attached to the ventilator, the machine is temporarily turned off. If the person can successfully breathe on their own, the breathing tube can soon be removed. This is called weaning. The decision to wean the person from the ventilator is made carefully to make sure the breathing tube is not removed too soon. When the breathing tube is removed, coughing and throat irritation may occur. The throat may be sore, and a hoarse voice may remain for a few days afterward. This information is not intended to replace advice given to you by your health care provider. Make sure you discuss any questions you have with your health care provider. Document Released: 05/10/2003 Document Revised: 04/16/2016 Document Reviewed: 09/29/2014 Elsevier Interactive Patient Education  [employer]2018 Elsevier Inc.

## 2017-10-13 NOTE — Addendum Note (Signed)
Addended by: Saul FordyceLOWE, CRYSTAL M on: 10/13/2017 01:02 PM   Modules accepted: Orders

## 2017-10-13 NOTE — Progress Notes (Signed)
Spoke with mother and is aware of referral appointment. Mother is going to contact home health that was treating patient for speech, OT and PT before and see if they would be willing to do home visits for therapy. Mother will call me back if other place is able to do therapy so I can cancel referral in epic for [facility] if needed.

## 2017-10-13 NOTE — Progress Notes (Addendum)
[facility] 01/30/2018 at 2:30 pm with Dr. Francesca OmanAmy Honor. Office phone number is [phone]. Office located at [facility] [address]th floor [address] [place]. Office fax is [phone].  [facility] [phone] will be faxing over a referral form and we will fax form back after Dr. Ardyth Manam signs form. It typically takes about a week for them to contact family for an appointment.   Put in Referral for Speech evaluation, PT and OT.

## 2017-11-20 ENCOUNTER — Encounter: Payer: Self-pay | Admitting: Pediatrics

## 2017-11-30 ENCOUNTER — Encounter: Payer: Self-pay | Admitting: Pediatrics

## 2017-12-01 ENCOUNTER — Encounter: Payer: Self-pay | Admitting: Pediatrics

## 2017-12-03 ENCOUNTER — Encounter (HOSPITAL_BASED_OUTPATIENT_CLINIC_OR_DEPARTMENT_OTHER): Payer: Self-pay | Admitting: *Deleted

## 2017-12-03 ENCOUNTER — Other Ambulatory Visit: Payer: Self-pay

## 2017-12-03 ENCOUNTER — Emergency Department (HOSPITAL_BASED_OUTPATIENT_CLINIC_OR_DEPARTMENT_OTHER): Payer: Medicaid Other

## 2017-12-03 ENCOUNTER — Emergency Department (HOSPITAL_BASED_OUTPATIENT_CLINIC_OR_DEPARTMENT_OTHER)
Admission: EM | Admit: 2017-12-03 | Discharge: 2017-12-03 | Disposition: A | Discharge status: Home / Self Care | Payer: Medicaid Other | Attending: Emergency Medicine | Admitting: Emergency Medicine

## 2017-12-03 DIAGNOSIS — Z79899 Other long term (current) drug therapy: Secondary | ICD-10-CM | POA: Insufficient documentation

## 2017-12-03 DIAGNOSIS — F842 Rett's syndrome: Secondary | ICD-10-CM | POA: Diagnosis not present

## 2017-12-03 DIAGNOSIS — R0981 Nasal congestion: Secondary | ICD-10-CM | POA: Insufficient documentation

## 2017-12-03 DIAGNOSIS — R509 Fever, unspecified: Secondary | ICD-10-CM | POA: Diagnosis present

## 2017-12-03 LAB — INFLUENZA PANEL BY PCR (TYPE A & B)
Influenza A By PCR: NEGATIVE
Influenza B By PCR: NEGATIVE

## 2017-12-03 NOTE — ED Triage Notes (Signed)
Pt to room 1 in w/c, mom reports pt with fever up to 101 yesterday, this am mom reports axillary temp of 104, motrin given at 0620.  Per mom and caregiver at bedside, Child is at her baseline nonverbal ms per mom and does not seem to be in any pain or discomfort.

## 2017-12-03 NOTE — ED Provider Notes (Signed)
[facility] EMERGENCY DEPARTMENT Provider Note   CSN: [id number] Arrival date & time: 12/03/17  0749     History   Chief Complaint Chief Complaint  Patient presents with  . Fever    HPI Dela Sweeny is a 22 y.o. female.  The history is provided by a parent. No language interpreter was used.  Fever     Ziana Heyliger is a 22 y.o. female who presents to the Emergency Department complaining of fever.  Level V caveat due to developmental delay.  Hx is provided by the mother.  Mother reports a fever to 101 since yesterday and 1 week of increased nasal congestion.  This morning her temperature was 105 axillary and Dana was given Motrin.  Over the last 3 days she has been on supplemental oxygen for oxygen sats that have been going down to 86%.  She does have as needed oxygen available at home.  Her mother also notes that when her temperature was up to 105 her heart rate was 170.  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  Mother is unaware of any pain and she has eating and drinking well.  No vomiting or diarrhea.  Her immunizations are up-to-date.  No known sick contacts. Past Medical History:  Diagnosis Date  . Fracture, humerus closed   . G tube feedings (HCC)   . Pneumonia   . Recurrent fever of unknown cause 08/31/2012   Fever to 104-105 lasting 24 hrs, occuring once a week for the past 5 weeks (onset Sept 2013)  . Rett's syndrome   . Scoliosis   . Seizure disorder (HCC)   . Seizures (HCC)   . Weight loss     Patient Active Problem List   Diagnosis Date Noted  . 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 Hand blister, left, subsequent encounter 07/11/2017  . Well adult on routine health check 01/08/2017  . Seborrhea capitis 03/07/2016  . Well child check 12/18/2015  . Nonverbal 11/21/2015  . Chronic constipation 09/30/2015  . Gastroesophageal reflux disease without esophagitis 09/30/2015  . Mental retardation 09/30/2015    . Polypharmacy 02/16/2015  . Required emergent intubation   . Acute respiratory failure, unspecified whether with hypoxia or hypercapnia (HCC)   . 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 Gastrostomy in place [facility]) 02/23/2014  . Altered growth and development 02/23/2014  . 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 Muscle spasticity 12/27/2013  . Personal history of other specified conditions presenting hazards to health 09/28/2013  . Disturbance in sleep behavior 09/21/2013  . Dyssomnia 09/21/2013  . Acute viral syndrome 07/27/2013  . Pre-menstrual syndrome 07/27/2013  . Fever in pediatric patient 09/01/2012  . 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 Seizures (HCC) 09/19/2011    Past Surgical History:  Procedure Laterality Date  . BACK SURGERY    . GASTROSTOMY TUBE PLACEMENT    . SPINAL GROWTH RODS      OB History    No data available       Home Medications    Prior to Admission medications   Medication Sig Start Date End Date Taking? Authorizing Provider  acetaminophen (TYLENOL) 160 MG/5ML suspension Take 560 mg by mouth every 6 (six) hours as needed for moderate pain.    [provider]  aluminum-magnesium hydroxide-simethicone (MAALOX) 200-200-20 MG/5ML SUSP Take 20 mLs by mouth every other day  as needed.    [provider]  cefdinir (OMNICEF) 250 MG/5ML suspension Take 5 mLs (250 mg total) by mouth 2 (two) times daily. 07/17/17   Georgiann Hahnamgoolam, Andres, MD  cetirizine (ZYRTEC) 1 MG/ML syrup TAKE 10MLS BY MOUTH EVERY DAY 02/16/17   Georgiann Hahnamgoolam, Andres, MD  clonazePAM (KLONOPIN) 0.25 MG disintegrating tablet Take 1 tablet by mouth 3 (three) times daily. 02/01/15   [provider]  diazepam (DIASAT) 20 MG GEL Place 12.5 mg rectally daily as needed (seizure). For seizures greater than 5 minutes 02/14/15   [provider]   divalproex (DEPAKOTE SPRINKLE) 125 MG capsule Take 3 caps in the morning, 3 capsules at lunch, and 4 capsules in the evening Patient taking differently: Take 375 mg by mouth every morning. Take 3 caps in the morning at 8 am. 01/09/14   Preston FleetingHooker, James B, MD  divalproex (DEPAKOTE) 500 MG DR tablet Take 2,000 mg by mouth 2 (two) times daily. Take 4 capsules (2000 mg) at 1 pm and Take 4 capsules (2000 mg) at 8pm.    [provider]  griseofulvin (GRIFULVIN V) 500 MG tablet TAKE 1 TABLET BY MOUTH EVERY DAY 05/05/16   Georgiann Hahnamgoolam, Andres, MD  guaifenesin (ROBITUSSIN) 100 MG/5ML syrup Take 300 mg by mouth 3 (three) times daily as needed for cough.    [provider]  lacosamide (VIMPAT) 200 MG TABS tablet Place 200 mg into feeding tube 2 (two) times daily. 02/14/15   [provider]  levETIRAcetam (KEPPRA) 100 MG/ML solution Place 1,500 mg into feeding tube 2 (two) times daily.  10/24/13   [provider]  loratadine (CLARITIN) 5 MG/5ML syrup Place 5 mLs (5 mg total) into feeding tube 2 (two) times daily. 07/16/17 08/16/17  Georgiann Hahnamgoolam, Andres, MD  LORazepam (ATIVAN) 1 MG tablet Take 0.5 tablets (0.5 mg total) by mouth 2 (two) times daily. 02/19/15   Arby BarrettePfeiffer, Marcy, MD  Multiple Vitamins-Minerals (MULTIVITAL) CHEW Chew 1 tablet by mouth every morning. 09/09/13 12/04/14  Georgiann Hahnamgoolam, Andres, MD  mupirocin cream (BACTROBAN) 2 % Apply 1 application topically daily as needed (irritation).     [provider]  nystatin cream (MYCOSTATIN) Apply topically 2 (two) times daily. Apply thin layer each diaper change as needed for diaper rash until rash gone 02/15/14   Preston FleetingHooker, James B, MD  Pediatric Multivit-Minerals-C (CHEWABLES MULTIVITAMIN) CHEW Chew 1 tablet by mouth daily.    [provider]  polyethylene glycol powder (GLYCOLAX/MIRALAX) powder TAKE 8.5 G BY MOUTH DAILY 05/08/15   Preston FleetingHooker, James B, MD  polyethylene glycol powder (GLYCOLAX/MIRALAX) powder TAKE 17 GRAMS BY MOUTH  DAILY. 09/11/16   Georgiann Hahnamgoolam, Andres, MD  selenium sulfide (SELSUN) 2.5 % shampoo Apply 1 application topically 2 (two) times a week. 04/04/16   Georgiann Hahnamgoolam, Andres, MD  silver sulfADIAZINE (SILVADENE) 1 % cream Apply 1 application topically daily. 07/10/17   Georgiann Hahnamgoolam, Andres, MD  sodium chloride (OCEAN) 0.65 % SOLN nasal spray Place 1 spray into both nostrils as needed for congestion.    [provider]    Family History Family History  Problem Relation Age of Onset  . Heart disease Other   . Hyperlipidemia Other   . Diabetes Other     Social History Social History   Tobacco Use  . Smoking status: Never Smoker  . Smokeless tobacco: Never Used  Substance Use Topics  . Alcohol use: Not on file  . Drug use: Not on file     Allergies   Onfi [clobazam] and Penicillins  Review of Systems Review of Systems  Constitutional: Positive for fever.  All other systems reviewed and are negative.    Physical Exam Updated Vital Signs BP (!) 101/59   Pulse 89   Temp 100.2 F (37.9 C) (Rectal)   Resp 16   Wt 31.3 kg (69 lb 0.1 oz)   SpO2 99%   BMI 16.04 kg/m   Physical Exam  Constitutional: She appears well-nourished.  HENT:  Head: Atraumatic.  TMs obscured by cerumen bilaterally.  Moist mucous membranes.  Minimal erythema in the posterior oropharynx.  Cardiovascular:  Tachycardic, no murmur.  Pulmonary/Chest: Effort normal.  Decreased air movement in all lung fields  Abdominal: Soft. There is no tenderness. There is no rebound and no guarding.  Gastrostomy tube in the upper abdomen with no surrounding erythema or tenderness  Musculoskeletal:  Contractures of bilateral lower extremities  Neurological:  Nonverbal.  Sleeping but arouses to examination.  Skin: Skin is warm and dry. Capillary refill takes less than 2 seconds.  Psychiatric:  Unable to assess  Nursing note and vitals reviewed.    ED Treatments / Results  Labs (all labs ordered are listed, but  only abnormal results are displayed) Labs Reviewed  INFLUENZA PANEL BY PCR (TYPE A & B)    EKG  EKG Interpretation None       Radiology Dg Chest 2 View  Result Date: 12/03/2017 CLINICAL DATA:  Fever and congestion EXAM: CHEST  2 VIEW COMPARISON:  September 21, 2017 FINDINGS: Lungs are clear. The heart size and pulmonary vascularity are normal. No adenopathy. There is scoliosis with multilevel thoracic and lumbar fusion. IMPRESSION: No edema or consolidation.  Stable cardiac silhouette. Electronically Signed   By: Bretta Bang III M.D.   On: 12/03/2017 09:03    Procedures Procedures (including critical care time)  Medications Ordered in ED Medications - No data to display   Initial Impression / Assessment and Plan / ED Course  I have reviewed the triage vital signs and the nursing notes.  Pertinent labs & imaging results that were available during my care of the patient were reviewed by me and considered in my medical decision making (see chart for details).     Viha presents for evaluation of fever since yesterday with congestion for the last week.  She is chronically ill on examination but in no acute distress.  She is well-hydrated with no respiratory distress.  There is no evidence of pneumonia.  Counseled mother on home care for fever as well as close return precautions for any new or worsening symptoms.  Discussed outpatient follow-up. Final Clinical Impressions(s) / ED Diagnoses   Final diagnoses:  Febrile illness, acute    ED Discharge Orders    None       Tilden Fossa, MD 12/03/17 1515

## 2017-12-24 ENCOUNTER — Other Ambulatory Visit: Payer: Self-pay | Admitting: Pediatrics

## 2017-12-24 ENCOUNTER — Ambulatory Visit (INDEPENDENT_AMBULATORY_CARE_PROVIDER_SITE_OTHER): Payer: Medicaid Other | Admitting: Pediatrics

## 2017-12-24 ENCOUNTER — Encounter: Payer: Self-pay | Admitting: Pediatrics

## 2017-12-24 VITALS — Wt <= 1120 oz

## 2017-12-24 DIAGNOSIS — J029 Acute pharyngitis, unspecified: Secondary | ICD-10-CM | POA: Diagnosis not present

## 2017-12-24 LAB — POCT INFLUENZA A: RAPID INFLUENZA A AGN: NEGATIVE

## 2017-12-24 LAB — POCT INFLUENZA B: Rapid Influenza B Ag: NEGATIVE

## 2017-12-24 MED ORDER — CLOTRIMAZOLE 1 % EX CREA: 1.0000 "application " | TOPICAL_CREAM | Freq: Two times a day (BID) | CUTANEOUS | 6 refills | Status: AC

## 2017-12-24 MED ORDER — ONDANSETRON HCL 4 MG/5ML PO SOLN: 4.0000 mg | Freq: Three times a day (TID) | ORAL | 2 refills | Status: AC | PRN

## 2017-12-24 MED ORDER — NYSTATIN 100000 UNIT/GM EX CREA: TOPICAL_CREAM | Freq: Two times a day (BID) | CUTANEOUS | 12 refills | Status: AC

## 2017-12-24 MED ORDER — POLYETHYLENE GLYCOL 3350 17 GM/SCOOP PO POWD: ORAL | 2 refills | Status: DC

## 2017-12-24 NOTE — Progress Notes (Signed)
History of Present Illness  22 year old female with history of developmental delay here for evaluation of congestion, cough, post tussive vomiting  and fever. Symptoms began 2-4 days ago, with little improvement since that time. Associated symptoms include nonproductive cough. Accompanied by NURSE and mom who denies dyspnea and productive cough.   The following portions of the patient's history were reviewed and updated as appropriate: allergies, current medications, past family history, past medical history, past social history, past surgical history and problem list.  Developmental History Developmental delay  Function: Mobility: manual wheelchair Pain concerns: no Hand function:  Right: reduced dexterity  Left: reduced dexterity Spine curvature: mild Swallowing: normal and modified diet (pureed) Toileting: dependent  1. Need to contact [name] for management of G- tube 2.Not eating all the time, nutrition shakes, need to know how much to give and when to give since when about to feed she still HAS RESIDUALS.---no GI is involved in her care--will refer to [facility] and while there will get Diettian involved with them to manage G-Tube feeds. 3. Will need to continue her physical and occupational therapy---will thus Refer to PT and OT 4. Refer to Speech ---for Evaluation for and provision of EYE GAZE equipment 5. Pulse ox dropping to 80's off and on--will send for pulmonary evaluation and apnea studies---[facility]  Discussed eye gaze equipment for home use Equipment discussed and continued need agreed: AFOs, bath chair, hand/wrist splint(s), life system, G tube, Ventilator with trache, suction machine, pulse ox and oxygen, skilled nursing care,  wheelchair and RAMP for house.  Review of Systems  Vision: impaired Hearing: impaired Seizures: yes - controlled Constipation: no GE reflux: yes - to be followed by GI Fractures: no  Objective:    Physical Exam Wt  70 lbs Cognition: non-interactive HEENT--mild nasal congestion --otherwise normal Respiratory: normal, no increased effort Lower extremity function:  Right: has on AFO to ankle  Left: AFO to ankle Actively wearing AFO at visit today Abdomen: normal-- G tube present Spine scoliosis: mild  Sitting Ability: assisted Gait: wheelchair    Assessment:    Non-specific viral syndrome.   Plan:    Normal progression of disease discussed. All questions answered. Explained the rationale for symptomatic treatment rather than use of an antibiotic. Instruction provided in the use of fluids, vaporizer, acetaminophen, and other OTC medication for symptom control. Extra fluids--zofran as needed Analgesics as needed, dose reviewed. Follow up as needed should symptoms fail to improve. FLU A and B negative

## 2017-12-24 NOTE — Patient Instructions (Signed)
Viral Illness, Adult Viruses are tiny germs that can get into a person's body and cause illness. There are many different types of viruses, and they cause many types of illness. Viral illnesses can range from mild to severe. They can affect various parts of the body. Common illnesses that are caused by a virus include colds and the flu. Viral illnesses also include serious conditions such as HIV/AIDS (human immunodeficiency virus/acquired immunodeficiency syndrome). A few viruses have been linked to certain cancers. What are the causes? Many types of viruses can cause illness. Viruses invade cells in your body, multiply, and cause the infected cells to malfunction or die. When the cell dies, it releases more of the virus. When this happens, you develop symptoms of the illness, and the virus continues to spread to other cells. If the virus takes over the function of the cell, it can cause the cell to divide and grow out of control, as is the case when a virus causes cancer. Different viruses get into the body in different ways. You can get a virus by:  Swallowing food or water that is contaminated with the virus.  Breathing in droplets that have been coughed or sneezed into the air by an infected person.  Touching a surface that has been contaminated with the virus and then touching your eyes, nose, or mouth.  Being bitten by an insect or animal that carries the virus.  Having sexual contact with a person who is infected with the virus.  Being exposed to blood or fluids that contain the virus, either through an open cut or during a transfusion.  If a virus enters your body, your body's defense system (immune system) will try to fight the virus. You may be at higher risk for a viral illness if your immune system is weak. What are the signs or symptoms? Symptoms vary depending on the type of virus and the location of the cells that it invades. Common symptoms of the main types of viral illnesses  include: Cold and flu viruses  Fever.  Headache.  Sore throat.  Muscle aches.  Nasal congestion.  Cough. Digestive system (gastrointestinal) viruses  Fever.  Abdominal pain.  Nausea.  Diarrhea. Liver viruses (hepatitis)  Loss of appetite.  Tiredness.  Yellowing of the skin (jaundice). Brain and spinal cord viruses  Fever.  Headache.  Stiff neck.  Nausea and vomiting.  Confusion or sleepiness. Skin viruses  Warts.  Itching.  Rash. Sexually transmitted viruses  Discharge.  Swelling.  Redness.  Rash. How is this treated? Viruses can be difficult to treat because they live within cells. Antibiotic medicines do not treat viruses because these drugs do not get inside cells. Treatment for a viral illness may include:  Resting and drinking plenty of fluids.  Medicines to relieve symptoms. These can include over-the-counter medicine for pain and fever, medicines for cough or congestion, and medicines to relieve diarrhea.  Antiviral medicines. These drugs are available only for certain types of viruses. They may help reduce flu symptoms if taken early. There are also many antiviral medicines for hepatitis and HIV/AIDS.  Some viral illnesses can be prevented with vaccinations. A common example is the flu shot. Follow these instructions at home: Medicines   Take over-the-counter and prescription medicines only as told by your health care provider.  If you were prescribed an antiviral medicine, take it as told by your health care provider. Do not stop taking the medicine even if you start to feel better.  Be aware   of when antibiotics are needed and when they are not needed. Antibiotics do not treat viruses. If your health care provider thinks that you may have a bacterial infection as well as a viral infection, you may get an antibiotic. ? Do not ask for an antibiotic prescription if you have been diagnosed with a viral illness. That will not make your  illness go away faster. ? Frequently taking antibiotics when they are not needed can lead to antibiotic resistance. When this develops, the medicine no longer works against the bacteria that it normally fights. General instructions  Drink enough fluids to keep your urine clear or pale yellow.  Rest as much as possible.  Return to your normal activities as told by your health care provider. Ask your health care provider what activities are safe for you.  Keep all follow-up visits as told by your health care provider. This is important. How is this prevented? Take these actions to reduce your risk of viral infection:  Eat a healthy diet and get enough rest.  Wash your hands often with soap and water. This is especially important when you are in public places. If soap and water are not available, use hand sanitizer.  Avoid close contact with friends and family who have a viral illness.  If you travel to areas where viral gastrointestinal infection is common, avoid drinking water or eating raw food.  Keep your immunizations up to date. Get a flu shot every year as told by your health care provider.  Do not share toothbrushes, nail clippers, razors, or needles with other people.  Always practice safe sex.  Contact a health care provider if:  You have symptoms of a viral illness that do not go away.  Your symptoms come back after going away.  Your symptoms get worse. Get help right away if:  You have trouble breathing.  You have a severe headache or a stiff neck.  You have severe vomiting or abdominal pain. This information is not intended to replace advice given to you by your health care provider. Make sure you discuss any questions you have with your health care provider. Document Released: 03/14/2016 Document Revised: 04/16/2016 Document Reviewed: 03/14/2016 Elsevier Interactive Patient Education  2018 [employer]  

## 2017-12-25 ENCOUNTER — Encounter: Payer: Self-pay | Admitting: Pediatrics

## 2017-12-28 ENCOUNTER — Encounter: Payer: Self-pay | Admitting: Pediatrics

## 2018-01-15 ENCOUNTER — Encounter: Payer: Self-pay | Admitting: Pediatrics

## 2018-02-09 ENCOUNTER — Encounter: Payer: Self-pay | Admitting: Pediatrics

## 2018-05-11 ENCOUNTER — Other Ambulatory Visit: Payer: Self-pay | Admitting: Pediatrics

## 2018-05-11 ENCOUNTER — Ambulatory Visit (INDEPENDENT_AMBULATORY_CARE_PROVIDER_SITE_OTHER): Payer: Medicaid Other | Admitting: Pediatrics

## 2018-05-11 ENCOUNTER — Ambulatory Visit
Admission: RE | Admit: 2018-05-11 | Discharge: 2018-05-11 | Disposition: A | Discharge status: Home / Self Care | Payer: Medicaid Other | Source: Ambulatory Visit | Attending: Pediatrics | Admitting: Pediatrics

## 2018-05-11 VITALS — Temp 97.1°F | Wt <= 1120 oz

## 2018-05-11 DIAGNOSIS — J181 Lobar pneumonia, unspecified organism: Secondary | ICD-10-CM | POA: Diagnosis not present

## 2018-05-11 DIAGNOSIS — R059 Cough, unspecified: Secondary | ICD-10-CM

## 2018-05-11 DIAGNOSIS — R05 Cough: Secondary | ICD-10-CM

## 2018-05-11 DIAGNOSIS — J189 Pneumonia, unspecified organism: Secondary | ICD-10-CM

## 2018-05-11 MED ORDER — CEFTRIAXONE SODIUM 500 MG IJ SOLR
500.0000 mg | Freq: Once | INTRAMUSCULAR | Status: AC
Start: 2018-05-11 — End: 2018-05-11
  Administered 2018-05-11: 500 mg via INTRAMUSCULAR

## 2018-05-11 MED ORDER — CEFDINIR 250 MG/5ML PO SUSR: 300.0000 mg | Freq: Two times a day (BID) | ORAL | 0 refills | Status: AC

## 2018-05-11 MED ORDER — MUPIROCIN 2 % EX OINT: TOPICAL_OINTMENT | CUTANEOUS | 2 refills | Status: AC

## 2018-05-11 NOTE — Patient Instructions (Signed)

## 2018-05-12 ENCOUNTER — Encounter: Payer: Self-pay | Admitting: Pediatrics

## 2018-05-12 DIAGNOSIS — J189 Pneumonia, unspecified organism: Secondary | ICD-10-CM | POA: Insufficient documentation

## 2018-05-12 DIAGNOSIS — J181 Lobar pneumonia, unspecified organism: Principal | ICD-10-CM

## 2018-05-12 NOTE — Progress Notes (Signed)
Subjective:    Jo Matthews is a 22 y.o. female with developmental delay and seizures/spatic quadriplegia who presents for  evaluation of cough, possible pneumonia. Symptoms include: anorexia, cough and sputum production. Symptoms began 2 days ago, and have been gradually worsening since that time. Patient denies weight loss. Treatment thus far includes: none. Past pulmonary history is significant for pneumonia.  The following portions of the patient's history were reviewed and updated as appropriate: allergies, current medications, past family history, past medical history, past social history, past surgical history and problem list.  Review of Systems Pertinent items are noted in HPI.   Objective:    Oxygen saturation 99% on 2 liters/min via Patient Spontanous Breathing Temp (!) 97.1 F (36.2 C) (Temporal)   Wt 69 lb (31.3 kg)   BMI 16.04 kg/m  General appearance: slowed mentation and global developmental delay Ears: normal TM's and external ear canals both ears Nose: clear discharge, mild congestion Lungs: diminished breath sounds base - left Heart: regular rate and rhythm, S1, S2 normal, no murmur, click, rub or gallop Skin: Skin color, texture, turgor normal. No rashes or lesions Neurologic:global developmental delay  Chest X ray---Left Lower Lobe pneumonia  Assessment:    Community acquired pneumonia   Plan:    The diagnosis was discussed along with the usual course and treatment. Started antibiotics per medication orders. Follow up with PCP in a few days.

## 2018-06-10 DIAGNOSIS — G808 Other cerebral palsy: Secondary | ICD-10-CM | POA: Insufficient documentation

## 2018-06-10 DIAGNOSIS — J206 Acute bronchitis due to rhinovirus: Secondary | ICD-10-CM | POA: Insufficient documentation

## 2018-06-11 DIAGNOSIS — K7682 Hepatic encephalopathy: Secondary | ICD-10-CM | POA: Insufficient documentation

## 2018-06-11 DIAGNOSIS — K7290 Hepatic failure, unspecified without coma: Secondary | ICD-10-CM | POA: Insufficient documentation

## 2018-06-14 DIAGNOSIS — D539 Nutritional anemia, unspecified: Secondary | ICD-10-CM | POA: Insufficient documentation

## 2018-06-17 MED ORDER — CLONAZEPAM 0.125 MG PO TBDP
0.25 | ORAL_TABLET | ORAL | Status: DC
Start: 2018-06-17 — End: 2018-06-17

## 2018-06-17 MED ORDER — DIVALPROEX SODIUM 125 MG PO CSDR
500.00 | DELAYED_RELEASE_CAPSULE | ORAL | Status: DC
Start: 2018-06-17 — End: 2018-06-17

## 2018-06-17 MED ORDER — DEXTROSE 10 % IV SOLN
125.00 | INTRAVENOUS | Status: DC
Start: ? — End: 2018-06-17

## 2018-06-17 MED ORDER — LEVETIRACETAM 100 MG/ML PO SOLN
2000.00 | ORAL | Status: DC
Start: 2018-06-18 — End: 2018-06-17

## 2018-06-17 MED ORDER — LEVETIRACETAM 100 MG/ML PO SOLN
2250.00 | ORAL | Status: DC
Start: 2018-06-17 — End: 2018-06-17

## 2018-06-17 MED ORDER — HEPARIN SODIUM (PORCINE) 5000 UNIT/ML IJ SOLN
5000.00 | INTRAMUSCULAR | Status: DC
Start: 2018-06-17 — End: 2018-06-17

## 2018-06-17 MED ORDER — ACETAMINOPHEN 500 MG PO TABS
1000.00 | ORAL_TABLET | ORAL | Status: DC
Start: ? — End: 2018-06-17

## 2018-06-17 MED ORDER — LACOSAMIDE 50 MG PO TABS
50.00 | ORAL_TABLET | ORAL | Status: DC
Start: 2018-06-17 — End: 2018-06-17

## 2018-06-17 MED ORDER — GENERIC EXTERNAL MEDICATION
Status: DC
Start: 2018-06-17 — End: 2018-06-17

## 2018-06-17 MED ORDER — DIVALPROEX SODIUM 125 MG PO CSDR
375.00 | DELAYED_RELEASE_CAPSULE | ORAL | Status: DC
Start: 2018-06-18 — End: 2018-06-17

## 2018-06-17 MED ORDER — IPRATROPIUM-ALBUTEROL 0.5-2.5 (3) MG/3ML IN SOLN
3.00 | RESPIRATORY_TRACT | Status: DC
Start: 2018-06-17 — End: 2018-06-17

## 2018-06-21 ENCOUNTER — Telehealth: Payer: Self-pay | Admitting: Pediatrics

## 2018-06-21 DIAGNOSIS — R0902 Hypoxemia: Secondary | ICD-10-CM

## 2018-06-21 NOTE — Telephone Encounter (Signed)
Mother called stating patient was [facility] for [facility] and was discharged on 06/17/2018. Follow up on CBC is needed. Spoke with mother and put order in for patient to have labs drawn at [facility]. Mother is aware of plan. Dr. Barney Drainamgoolam with contact mother with results.

## 2018-06-22 LAB — CBC WITH DIFFERENTIAL/PLATELET
Basophils Absolute: 23 cells/uL (ref 0–200)
Basophils Relative: 0.3 %
EOS ABS: 188 {cells}/uL (ref 15–500)
Eosinophils Relative: 2.5 %
HCT: 31.4 % — ABNORMAL LOW (ref 35.0–45.0)
Hemoglobin: 10.7 g/dL — ABNORMAL LOW (ref 11.7–15.5)
Lymphs Abs: 3015 cells/uL (ref 850–3900)
MCH: 34.6 pg — AB (ref 27.0–33.0)
MCHC: 34.1 g/dL (ref 32.0–36.0)
MCV: 101.6 fL — AB (ref 80.0–100.0)
MONOS PCT: 7.8 %
MPV: 9.7 fL (ref 7.5–12.5)
NEUTROS PCT: 49.2 %
Neutro Abs: 3690 cells/uL (ref 1500–7800)
PLATELETS: 292 10*3/uL (ref 140–400)
RBC: 3.09 10*6/uL — AB (ref 3.80–5.10)
RDW: 14.4 % (ref 11.0–15.0)
TOTAL LYMPHOCYTE: 40.2 %
WBC mixed population: 585 cells/uL (ref 200–950)
WBC: 7.5 10*3/uL (ref 3.8–10.8)

## 2018-06-29 ENCOUNTER — Ambulatory Visit (INDEPENDENT_AMBULATORY_CARE_PROVIDER_SITE_OTHER): Payer: Medicaid Other | Admitting: Pediatrics

## 2018-06-29 VITALS — Wt 72.1 lb

## 2018-06-29 DIAGNOSIS — Z931 Gastrostomy status: Secondary | ICD-10-CM

## 2018-06-29 DIAGNOSIS — Z9189 Other specified personal risk factors, not elsewhere classified: Secondary | ICD-10-CM | POA: Diagnosis not present

## 2018-06-29 DIAGNOSIS — G40109 Localization-related (focal) (partial) symptomatic epilepsy and epileptic syndromes with simple partial seizures, not intractable, without status epilepticus: Secondary | ICD-10-CM

## 2018-06-29 DIAGNOSIS — J159 Unspecified bacterial pneumonia: Secondary | ICD-10-CM | POA: Diagnosis not present

## 2018-06-29 DIAGNOSIS — G8250 Quadriplegia, unspecified: Secondary | ICD-10-CM

## 2018-06-29 DIAGNOSIS — J9600 Acute respiratory failure, unspecified whether with hypoxia or hypercapnia: Secondary | ICD-10-CM

## 2018-06-29 DIAGNOSIS — Z09 Encounter for follow-up examination after completed treatment for conditions other than malignant neoplasm: Secondary | ICD-10-CM | POA: Diagnosis not present

## 2018-07-01 ENCOUNTER — Encounter: Payer: Self-pay | Admitting: Pediatrics

## 2018-07-01 DIAGNOSIS — Z09 Encounter for follow-up examination after completed treatment for conditions other than malignant neoplasm: Secondary | ICD-10-CM | POA: Insufficient documentation

## 2018-07-01 DIAGNOSIS — J159 Unspecified bacterial pneumonia: Secondary | ICD-10-CM | POA: Insufficient documentation

## 2018-07-01 DIAGNOSIS — Z9189 Other specified personal risk factors, not elsewhere classified: Secondary | ICD-10-CM | POA: Insufficient documentation

## 2018-07-01 NOTE — Progress Notes (Signed)
Subjective:    Jo Matthews is a 22 y.o. 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 Symptoms include: cough and sputum production. Symptoms began a few days ago, and have been controlled since that time. She was taken to ER when symptoms developed and treated for aspiration pneumonia at the hospital for three days and now much improved. Doing well now and back to baseline.  The following portions of the patient's history were reviewed and updated as appropriate: allergies, current medications, past family history, past medical history, past social history, past surgical history and problem list.  Review of Systems Pertinent items are noted in HPI.   Objective:    Oxygen saturation 99% on room air Wt 72 lb 1.6 oz (32.7 kg)   BMI 16.76 kg/m  General appearance: no distress Ears: normal TM's and external ear canals both ears Nose: Nares normal. Septum midline. Mucosa normal. No drainage or sinus tenderness. Lungs: clear to auscultation bilaterally Heart: regular rate and rhythm, S1, S2 normal, no murmur, click, rub or gallop Neurologic: baseline mental status  Assessment:    Aspiration pneumonia.   Plan:    Educational material distributed. Follow up in a few days or as needed.

## 2018-07-01 NOTE — Patient Instructions (Signed)
Bronchospasm, Adult Bronchospasm is when airways in the lungs get smaller. When this happens, it can be hard to breathe. You may cough. You may also make a whistling sound when you breathe (wheeze). Follow these instructions at home: Medicines  Take over-the-counter and prescription medicines only as told by your doctor.  If you need to use an inhaler or nebulizer to take your medicine, ask your doctor how to use it.  If you were given a spacer, always use it with your inhaler. Lifestyle  Change your heating and air conditioning filter. Do this at least once a month.  Try not to use fireplaces and wood stoves.  Do not  smoke. Do not  allow smoking in your home.  Try not to use things that have a strong smell, like perfume.  Get rid of pests (such as roaches and mice) and their poop.  Remove any mold from your home.  Keep your house clean. Get rid of dust.  Use cleaning products that have no smell.  Replace carpet with wood, tile, or vinyl flooring.  Use allergy-proof pillows, mattress covers, and box spring covers.  Wash bed sheets and blankets every week. Use hot water. Dry them in a dryer.  Use blankets that are made of polyester or cotton.  Wash your hands often.  Keep pets out of your bedroom.  When you exercise, try not to breathe in cold air. General instructions  Have a plan for getting medical care. Know these things: ? When to call your doctor. ? When to call local emergency services (911 in the [place] ? Where to go in an emergency.  Stay up to date on your shots (immunizations).  When you have an episode: ? Stay calm. ? Relax. ? Breathe slowly. Contact a doctor if:  Your muscles ache.  Your chest hurts.  The color of the mucus you cough up (sputum) changes from clear or white to yellow, green, gray, or bloody.  The mucus you cough up gets thicker.  You have a fever. Get help right away if:  The whistling sound gets worse, even after you  take your medicines.  Your coughing gets worse.  You find it even harder to breathe.  Your chest hurts very much. Summary  Bronchospasm is when airways in the lungs get smaller.  When this happens, it can be hard to breathe. You may cough. You may also make a whistling sound when you breathe.  Stay away from things that cause you to have episodes. These include smoke or dust. This information is not intended to replace advice given to you by your health care provider. Make sure you discuss any questions you have with your health care provider. Document Released: 08/31/2009 Document Revised: 11/06/2016 Document Reviewed: 11/06/2016 Elsevier Interactive Patient Education  2017 Elsevier Inc.  

## 2018-07-14 DIAGNOSIS — G4731 Primary central sleep apnea: Secondary | ICD-10-CM | POA: Insufficient documentation

## 2018-07-14 DIAGNOSIS — G4739 Other sleep apnea: Secondary | ICD-10-CM | POA: Insufficient documentation

## 2018-07-16 DIAGNOSIS — J9612 Chronic respiratory failure with hypercapnia: Secondary | ICD-10-CM | POA: Insufficient documentation

## 2018-07-30 ENCOUNTER — Encounter: Payer: Self-pay | Admitting: Pediatrics

## 2018-08-11 ENCOUNTER — Encounter: Payer: Self-pay | Admitting: Pediatrics

## 2018-08-13 ENCOUNTER — Encounter: Payer: Self-pay | Admitting: Pediatrics

## 2018-11-29 ENCOUNTER — Encounter: Payer: Self-pay | Admitting: Pediatrics

## 2018-12-08 ENCOUNTER — Encounter: Payer: Self-pay | Admitting: Pediatrics

## 2019-01-10 ENCOUNTER — Encounter: Payer: Self-pay | Admitting: Pediatrics

## 2019-02-22 ENCOUNTER — Telehealth: Payer: Self-pay

## 2019-02-22 NOTE — Telephone Encounter (Signed)
Spoke to nurse and verbal order given for claritin 10 mg daily

## 2019-02-22 NOTE — Telephone Encounter (Signed)
Mother called stating that she needs the allergie medicine orders changed from 31ml every a.m and p.m. to 63ml every a.m. for [facility] Nurse. Per mom having a hard time taking 69ml twice a day.Would like to speak to provider regarding change.

## 2019-03-03 ENCOUNTER — Telehealth: Payer: Self-pay | Admitting: Pediatrics

## 2019-03-03 NOTE — Telephone Encounter (Signed)
Letter for quarantine of patient and mother written

## 2019-03-03 NOTE — Telephone Encounter (Signed)
Mom called and requested a letter to be written stating that Jo Matthews has underlying conditions and mom needs to stay home with her keeping her "quarantined" to prevent Makaiyah's exposure to possible coronovirus.

## 2019-03-07 ENCOUNTER — Telehealth: Payer: Self-pay | Admitting: Pediatrics

## 2019-03-07 NOTE — Telephone Encounter (Signed)
Mother called stating patient started running fever on Saturday of 99.1 with dry cough. Patietn woke up this morning with fever of 101.7 with no cough. Patient is currently on 2 liters of oxygen so mother does not if she is having a hard time breathing or not. Per Dr. Barney Drain, advised mother to continue to give tylenol as needed for fever, give plenty of fluids and if the fever or cough persistent then to take to [facility] ER. Mother agrees with plan.

## 2019-03-07 NOTE — Telephone Encounter (Signed)
Concurs with advice given by CMA  

## 2019-03-16 ENCOUNTER — Encounter: Payer: Self-pay | Admitting: Pediatrics

## 2019-05-18 ENCOUNTER — Encounter: Payer: Self-pay | Admitting: Pediatrics

## 2019-06-07 ENCOUNTER — Telehealth: Payer: Self-pay | Admitting: Pediatrics

## 2019-06-07 NOTE — Telephone Encounter (Signed)
Jo Matthews has developed right leg swelling and pain with manipulation. She has a history of CP, intellectual disability, spastic quadriplegia, epilepsy. I called [facility] and was able to get Jo Matthews scheduled to be seen today. She has an appointment with Dr. Alfonso Ramus at 3:15 today. Mom verbalized understanding and agreement.

## 2019-07-26 ENCOUNTER — Telehealth: Payer: Self-pay | Admitting: Pediatrics

## 2019-07-26 NOTE — Telephone Encounter (Signed)
DME form for 08-02-19 [facility] and face to face with encounter form on Kathy's desk

## 2019-08-02 ENCOUNTER — Other Ambulatory Visit: Payer: Self-pay

## 2019-08-02 ENCOUNTER — Ambulatory Visit (INDEPENDENT_AMBULATORY_CARE_PROVIDER_SITE_OTHER): Payer: Medicaid Other | Admitting: Pediatrics

## 2019-08-02 ENCOUNTER — Encounter: Payer: Self-pay | Admitting: Pediatrics

## 2019-08-02 VITALS — BP 106/62 | Ht <= 58 in | Wt 97.8 lb

## 2019-08-02 DIAGNOSIS — Z23 Encounter for immunization: Secondary | ICD-10-CM | POA: Diagnosis not present

## 2019-08-02 DIAGNOSIS — Z68.41 Body mass index (BMI) pediatric, less than 5th percentile for age: Secondary | ICD-10-CM | POA: Diagnosis not present

## 2019-08-02 DIAGNOSIS — Z0001 Encounter for general adult medical examination with abnormal findings: Secondary | ICD-10-CM | POA: Diagnosis not present

## 2019-08-02 DIAGNOSIS — F842 Rett's syndrome: Secondary | ICD-10-CM | POA: Diagnosis not present

## 2019-08-02 DIAGNOSIS — R0902 Hypoxemia: Secondary | ICD-10-CM

## 2019-08-02 DIAGNOSIS — N943 Premenstrual tension syndrome: Secondary | ICD-10-CM

## 2019-08-02 DIAGNOSIS — Z00121 Encounter for routine child health examination with abnormal findings: Secondary | ICD-10-CM

## 2019-08-02 DIAGNOSIS — Z0000 Encounter for general adult medical examination without abnormal findings: Secondary | ICD-10-CM

## 2019-08-02 DIAGNOSIS — Z9981 Dependence on supplemental oxygen: Secondary | ICD-10-CM

## 2019-08-02 NOTE — Progress Notes (Addendum)
CBD oil---from neurology   History of Present Illness Main concerns today are: Routine care and follow up for Rett's syndrome and developmental delay. Ventilator dependent G tube dependent ---needs tubes and extension sets.    Developmental History Developmental delay  Function: Mobility: manual wheelchair Pain concerns: no Hand function:  Right: reduced dexterity  Left: reduced dexterity Spine curvature: mild Swallowing: normal and modified diet (pureed) Toileting: dependent   Equipment:  Estate manager/land agent Chest vest Suction tubes and suction machine Nebulizer Pulse ox Oxygen tubes and tank Diapers Wipes and gloves G -tube button with Tubings   Oral feeds--regular  Therapy----none   Specialists- Neuro[facility]  Pulmonary-[facility] Cardio--referral Endocrinology--referral Dental--Needs one   Review of Systems: Vision: impaired Hearing: impaired Seizures: yes - controlled Constipation: no GE reflux: yes  Fractures: no  The following portions of the patient's history were reviewed and updated as appropriate: allergies, current medications, past family history, past medical history, past social history, past surgical history and problem list.  Referrals needed-- Cardiology and Endocrine  Objective:    Physical Exam Wt 44.4 kg Height--59 in Cognition: non-interactive Respiratory: normal, no increased effort Lower extremity function:  In wheelchair Abdomen: normal-- G tube present Spine scoliosis: mild --with rod Sitting Ability: assisted Gait: wheelchair   Needs non invasive vent--benefits form and using vent therapy   Assessment:   Developmental delay with Rett's Syndrome   Plan:    1. Gross motor: delayed 2. Fine motor/ADL: delayd 3. Educational/vocational: n/a 4. Transition skills: n/a 5. Speech/swallowing: no speech, GERD Refer to cardiology and Endocrine

## 2019-08-02 NOTE — Patient Instructions (Signed)
Epilepsy Epilepsy is when a person keeps having seizures. A seizure is a burst of abnormal activity in the brain. A seizure can change how you think or behave, and it can make it hard to be aware of what is happening. This condition can cause problems such as:  Falls, accidents, and injury.  Sadness (depression).  Poor memory.  Sudden unexplained death in epilepsy (SUDEP). This is rare. Its cause is not known. Most people with epilepsy lead normal lives. What are the causes? This condition may be caused by:  A head injury.  An injury that happens at birth.  A high fever during childhood.  A stroke.  Bleeding that goes into or around the brain.  Certain medicines and drugs.  Having too little oxygen for a long period of time.  Abnormal brain development.  Certain infections.  Brain tumors.  Conditions that are passed from parent to child (are hereditary). What are the signs or symptoms? Symptoms of a seizure vary from person to person. They may include:  Jerky movements of muscles (convulsions).  Stiffening of the body.  Movements of the arms or legs that you are not able to control.  Passing out (loss of consciousness).  Breathing problems.  Sudden falls.  Confusion.  Head nodding.  Eye blinking or twitching.  Lip smacking.  Drooling.  Fast eye movements.  Grunting.  Not being able to control when you pee or poop.  Staring.  Being hard to wake up (unresponsiveness). Some people have symptoms right before a seizure happens (aura) and right after a seizure happens. These symptoms include:  Fear or anxiety.  Feeling sick to your stomach (nauseous).  Feeling like the room is spinning (vertigo).  A feeling of having seen or heard something before (dj vu).  Odd tastes or smells.  Changes in how you see (vision), such as seeing flashing lights or spots. Symptoms that follow a seizure include:  Being confused.  Being sleepy.  Having a  headache. How is this treated? Treatment can control seizures. Treatment for this condition may involve:  Taking medicines to control seizures.  Having a device (vagus nerve stimulator) put in the chest. The device sends signals to a nerve and to the brain to prevent seizures.  Brain surgery to stop seizures from happening or to reduce how often they happen.  Having blood tests often to make sure you are getting the right amount of medicine. Once this condition has been diagnosed, it is important to start treatment as soon as possible. For some people, epilepsy goes away in time. Others will need treatment for the rest of their life. Follow these instructions at home: Medicines  Take over-the-counter and prescription medicines only as told by your doctor.  Avoid anything that may keep your medicine from working, such as alcohol. Activity  Get enough rest. Lack of sleep can make seizures more likely to occur.  Follow your doctor's advice about driving, swimming, and doing anything else that would be dangerous if you had a seizure. ? If you live in the [place] check with your local DMV (department of motor vehicles) to find out about local driving laws. Each state has rules about when you can return to driving. Teaching others Teach friends and family what to do if you have a seizure. They should:  Lay you on the ground to prevent a fall.  Cushion your head and body.  Loosen any tight clothing around your neck.  Turn you on your side.  Stay with   you until you are better.  Not hold you down.  Not put anything in your mouth.  Know whether or not you need emergency care.  General instructions  Avoid anything that causes you to have seizures.  Keep a seizure diary. Write down what you remember about each seizure. Be sure to include what might have caused it.  Keep all follow-up visits as told by your doctor. This is important. Contact a doctor if:  You have a change in how  often or when you have seizures.  You get an infection or start to feel sick. You may have more seizures when you are sick. Get help right away if:  A seizure does not stop after 5 minutes.  You have more than one seizure in a row, and you do not have enough time between the seizures to feel better.  A seizure makes it harder to breathe.  A seizure is different from other seizures you have had.  A seizure makes you unable to speak or use a part of your body.  You did not wake up right after a seizure. These symptoms may be an emergency. Do not wait to see if the symptoms will go away. Get medical help right away. Call your local emergency services (911 in the [place] Do not drive yourself to the hospital. Summary  Epilepsy is when a person keeps having seizures. A seizure is a burst of abnormal activity in the brain.  Treatment can control seizures.  Teach friends and family what to do if you have a seizure. This information is not intended to replace advice given to you by your health care provider. Make sure you discuss any questions you have with your health care provider. Document Released: 08/31/2009 Document Revised: 06/28/2018 Document Reviewed: 06/28/2018 Elsevier Patient Education  2020 Elsevier Inc.  

## 2019-08-03 ENCOUNTER — Encounter: Payer: Self-pay | Admitting: Pediatrics

## 2019-08-04 ENCOUNTER — Encounter: Payer: Self-pay | Admitting: Pediatrics

## 2019-08-04 DIAGNOSIS — Z68.41 Body mass index (BMI) pediatric, less than 5th percentile for age: Secondary | ICD-10-CM | POA: Insufficient documentation

## 2019-08-05 NOTE — Addendum Note (Signed)
Addended by: Gari Crown on: 08/05/2019 01:45 PM   Modules accepted: Orders

## 2019-09-30 ENCOUNTER — Telehealth: Payer: Self-pay | Admitting: Cardiology

## 2019-09-30 NOTE — Telephone Encounter (Signed)
I spoke to the patient's mother Mordecai Rasmussen) who called because she needs to accompany the patient to her appointment along with care giving nurse.  The patient has physical and mental handicaps.

## 2019-09-30 NOTE — Telephone Encounter (Signed)
New Message  Patient's mother, Shauna Hugh, is calling in to get herself as well as her daughter's nurse approved to attend the appointment with the patient. Diane states that patient is basically an infant, she does not talk, she has seizures, and she is unable to do anything for herself and will always need to be accompanied by herself and her nurse. Please give patient's mother (Diane) a call to confirm.

## 2019-10-04 ENCOUNTER — Ambulatory Visit: Payer: Medicaid Other | Admitting: Cardiology

## 2019-10-20 ENCOUNTER — Telehealth: Payer: Self-pay | Admitting: Pediatrics

## 2019-10-20 DIAGNOSIS — Z9981 Dependence on supplemental oxygen: Secondary | ICD-10-CM

## 2019-10-20 DIAGNOSIS — R0902 Hypoxemia: Secondary | ICD-10-CM

## 2019-10-20 NOTE — Telephone Encounter (Signed)
Referral has been made in epic.

## 2019-10-20 NOTE — Telephone Encounter (Signed)
Spoke to Dr Leana Roe Turner-cardiologist. She says that she thinks that Jo Matthews should best be seen at a tertiary center due to her complex medical history and need for significant care coordination. Will refer to adult cardiology at [facility] ---spoke to mom.

## 2019-10-20 NOTE — Addendum Note (Signed)
Addended by: Gari Crown on: 10/20/2019 04:53 PM   Modules accepted: Orders

## 2019-10-21 ENCOUNTER — Ambulatory Visit: Payer: Medicaid Other | Admitting: Cardiology

## 2019-10-26 ENCOUNTER — Encounter: Payer: Self-pay | Admitting: General Practice

## 2019-11-25 ENCOUNTER — Other Ambulatory Visit: Payer: Self-pay

## 2019-11-29 ENCOUNTER — Ambulatory Visit: Payer: Medicaid Other | Admitting: Endocrinology

## 2020-01-30 ENCOUNTER — Encounter: Payer: Self-pay | Admitting: Pediatrics

## 2020-01-31 ENCOUNTER — Encounter: Payer: Self-pay | Admitting: Pediatrics

## 2020-01-31 ENCOUNTER — Ambulatory Visit (INDEPENDENT_AMBULATORY_CARE_PROVIDER_SITE_OTHER): Payer: Medicaid Other | Admitting: Pediatrics

## 2020-01-31 ENCOUNTER — Other Ambulatory Visit: Payer: Self-pay

## 2020-01-31 DIAGNOSIS — G8250 Quadriplegia, unspecified: Secondary | ICD-10-CM | POA: Diagnosis not present

## 2020-01-31 DIAGNOSIS — G808 Other cerebral palsy: Secondary | ICD-10-CM

## 2020-01-31 DIAGNOSIS — Z931 Gastrostomy status: Secondary | ICD-10-CM | POA: Diagnosis not present

## 2020-01-31 MED ORDER — MUPIROCIN 2 % EX OINT: TOPICAL_OINTMENT | CUTANEOUS | 12 refills | Status: DC

## 2020-01-31 MED ORDER — CETIRIZINE HCL 1 MG/ML PO SOLN: 10.0000 mg | Freq: Every day | ORAL | 12 refills | Status: DC

## 2020-01-31 MED ORDER — CETIRIZINE HCL 1 MG/ML PO SOLN: 2.5000 mg | Freq: Every day | ORAL | 5 refills | Status: DC

## 2020-01-31 MED ORDER — CLONAZEPAM 0.25 MG PO TBDP: ORAL_TABLET | ORAL | 5 refills | Status: DC

## 2020-01-31 NOTE — Patient Instructions (Signed)
Gastrostomy Tube Home Guide, Adult A gastrostomy tube, or G-tube, is a tube that is inserted through the abdomen into the stomach. The tube is used to give feedings and medicines when a person is unable to eat and drink enough on his or her own. How to care for a G-tube Supplies needed  Saline solution or clean, warm water and soap.  Cotton swab or gauze.  Precut gauze bandage (dressing) and tape, if needed. Instructions 1. Wash your hands with soap and water. 2. If there is a dressing between the person's skin and the tube, remove it. 3. Check the area where the tube enters the skin. Check for problems such as: ? Redness. ? Swelling. ? Pus-like drainage. ? Extra skin growth. 4. Moisten the cotton swab with the saline solution or soap and water mixture. Gently clean around the insertion site. Remove any drainage or crusting. ? When the G-tube is first put in, a normal saline solution or water can be used to clean the skin. ? Mild soap and warm water can be used when the skin around the G-tube site has healed. 5. If there should be a dressing between the person's skin and the tube, apply it at this time. How to flush a G-tube Flush the G-tube regularly to keep it from clogging. Flush it before and after feedings and as often as told by the health care provider. Supplies needed  Purified or sterile water, warmed. If the person has a weak disease-fighting (immune) system, or if he or she has difficulty fighting off infections (is immunocompromised), use only sterile water. ? If you are unsure about the amount of chemical contaminants in purified or drinking water, use sterile water. ? To purify drinking water by boiling:  Boil water for at least 1 minute. Keep lid over water while it boils. Allow water to cool to room temperature before using.  60cc G-tube syringe. Instructions 1. Wash your hands with soap and water. 2. Draw up 30 mL of warm water in a syringe. 3. Connect the  syringe to the tube. 4. Slowly and gently push the water into the tube. G-tube problems and solutions  If the tube comes out: ? Cover the opening with a clean dressing and tape. ? Call a health care provider right away. ? A health care provider will need to put the tube back in within 4 hours.  If there is skin or scar tissue growing where the tube enters the skin: ? Keep the area clean and dry. ? Secure the tube with tape so that the tube does not move around too much. ? Call a health care provider.  If the tube gets clogged: ? Slowly push warm water into the tube with a large syringe. ? Do not force the fluid into the tube or push an object into the tube. ? If you are not able to unclog the tube, call a health care provider right away. Follow these instructions at home: Feedings  Give feedings at room temperature.  Cover and place unused feedings in the refrigerator.  If feedings are continuous: ? Do not put more than 4 hours worth of feedings in the feeding bag. ? Stop the feedings when you need to give medicine or flush the tube. Be sure to restart the feedings. ? Make sure the person's head is above his or her stomach (upright position). This will prevent choking and discomfort.  Replace feeding bags and syringes as told by the health care provider.  Make   sure the person is in the right position during and after feedings: ? During feedings, the person's position should be in the upright position. ? After a noncontinuous feeding (bolus feeding), have the person stay in the upright position for 1 hour. General instructions  Only use syringes made for G-tubes.  Do not pull or put tension on the tube.  Clamp the tube before removing the cap or disconnecting a syringe.  Measure the length of the G-tube every day from the insertion site to the end of the tube.  If the person's G-tube has a balloon, check the fluid in the balloon every week. The amount of fluid that should  be in the balloon can be found in the manufacturer's specifications.  Make sure the person takes care of his or her oral health, such as by brushing his or her teeth.  Remove excess air from the G-tube as told by the person's health care provider. This is called "venting."  Keep the area where the tube enters the skin clean and dry.  Do not push feedings, medicines, or flushes rapidly. Contact a health care provider if:  The person with the tube has any of these problems: ? Constipation. ? Fever.  There is a large amount of fluid or mucus-like liquid leaking from the tube.  Skin or scar tissue appears to be growing where the tube enters the skin.  The length of tube from the insertion site to the G-tube gets longer. Get help right away if:  The person with the tube has any of these problems: ? Severe abdominal pain. ? Severe tenderness. ? Severe bloating. ? Nausea. ? Vomiting. ? Trouble breathing. ? Shortness of breath.  Any of these problems happen in the area where the tube enters the skin: ? Redness, irritation, swelling, or soreness. ? Pus-like discharge. ? A bad smell.  The tube is clogged and cannot be flushed.  The tube comes out. Summary  A gastrostomy tube, or G-tube, is a tube that is inserted through the abdomen into the stomach. The tube is used to give feedings and medicines when a person is unable to eat and drink enough on his or her own.  Check and clean the insertion site daily as told by the person's health care provider.  Flush the G-tube regularly to keep it from clogging. Flush it before and after feedings and as often as told by the person's health care provider.  Keep the area where the tube enters the skin clean and dry. This information is not intended to replace advice given to you by your health care provider. Make sure you discuss any questions you have with your health care provider. Document Revised: 10/16/2017 Document Reviewed:  12/29/2016 Elsevier Patient Education  2020 Elsevier Inc.  

## 2020-01-31 NOTE — Progress Notes (Signed)
  History of Present Illness Main concerns today are: Routine care and follow up for Rett's syndrome and developmental delay. Ventilator dependent G tube dependent ---needs tubes and extension sets.    Developmental History Developmental delay  Function: Mobility: manual wheelchair Pain concerns: no Hand function:  Right: reduced dexterity  Left: reduced dexterity Spine curvature: mild Swallowing: normal and modified diet (pureed) Toileting: dependent   Equipment:  Data processing manager Chest vest Suction tubes and suction machine Nebulizer Pulse ox Oxygen tubes and tank Diapers Wipes and gloves G -tube button with Tubings Non invasive vent--benefits form and using vent therapy   Oral feeds--via G tube Osmolal--1.5 cals. Bolus 240 mls  Therapy----none   Specialists- [facility] Pulmonary-[facility] Cardio--[facility] Endocrinology--[facility]   Review of Systems: Vision: impaired Hearing: impaired Seizures: yes - controlled Constipation: no GE reflux: yes  Fractures: no  The following portions of the patient's history were reviewed and updated as appropriate: allergies, current medications, past family history, past medical history, past social history, past surgical history and problem list.  Referrals needed-- none  Objective:    Physical Exam Wt 45.4 kg Height--59 in Cognition: non-interactive Respiratory: normal, no increased effort Lower extremity function:  In wheelchair Abdomen: normal-- G tube present Spine scoliosis: mild --with rod Sitting Ability: assisted Gait: wheelchair    Assessment:   Developmental delay with Rett's Syndrome   Plan:    1. Gross motor: delayed 2. Fine motor/ADL: delayd 3. Educational/vocational: n/a 4. Transition skills: n/a 5. Speech/swallowing: no speech, GERD

## 2020-04-17 ENCOUNTER — Telehealth: Payer: Self-pay | Admitting: Pediatrics

## 2020-04-17 MED ORDER — CEPHALEXIN 250 MG/5ML PO SUSR: 500.0000 mg | Freq: Two times a day (BID) | ORAL | 0 refills | Status: AC

## 2020-04-17 NOTE — Telephone Encounter (Signed)
Called in keflex  

## 2020-04-17 NOTE — Telephone Encounter (Signed)
  Who's calling (NAME and relationship to patient) : Pecola Lawless Best contact number:[phone]  Provider they ESP:[id number]    Triage for Fungus on finger you saw is spreading to other finger  Symptoms:     Covid test in the last 14 days?no  Possible Covid exposure?no

## 2020-04-24 ENCOUNTER — Telehealth: Payer: Self-pay | Admitting: Pediatrics

## 2020-04-24 NOTE — Telephone Encounter (Signed)
Jo Matthews was put on an antibiotic last week and is now having seizures and the home health nurse needs to talk to you please

## 2020-04-24 NOTE — Telephone Encounter (Signed)
Spoke to nurse and discontinued keflex--follow as needed

## 2020-08-17 ENCOUNTER — Other Ambulatory Visit: Payer: Self-pay | Admitting: Pediatrics

## 2020-11-01 ENCOUNTER — Other Ambulatory Visit: Payer: Self-pay | Admitting: Pediatrics

## 2020-11-13 ENCOUNTER — Telehealth: Payer: Self-pay

## 2020-11-13 NOTE — Telephone Encounter (Signed)
Spoke to mom and she is seeing an ophthalmologist at 3:30 pm today for possible scratched cornea.

## 2020-11-13 NOTE — Telephone Encounter (Signed)
Eye is red with clear drainage ... unable to open it fully.  Her mother did not want to bring her in unless necessary and asked to speak with a provider before scheduling an appointment. Asked for a call back.

## 2020-12-26 ENCOUNTER — Encounter: Payer: Self-pay | Admitting: Pediatrics

## 2021-02-14 ENCOUNTER — Other Ambulatory Visit: Payer: Self-pay | Admitting: Pediatrics

## 2021-02-15 ENCOUNTER — Other Ambulatory Visit: Payer: Self-pay

## 2021-02-15 ENCOUNTER — Ambulatory Visit (INDEPENDENT_AMBULATORY_CARE_PROVIDER_SITE_OTHER): Payer: Medicaid Other | Admitting: Pediatrics

## 2021-02-15 ENCOUNTER — Other Ambulatory Visit: Payer: Self-pay | Admitting: Pediatrics

## 2021-02-15 VITALS — BP 118/72 | Ht 59.0 in | Wt 95.0 lb

## 2021-02-15 DIAGNOSIS — G8250 Quadriplegia, unspecified: Secondary | ICD-10-CM | POA: Diagnosis not present

## 2021-02-15 DIAGNOSIS — B351 Tinea unguium: Secondary | ICD-10-CM

## 2021-02-15 DIAGNOSIS — Z0001 Encounter for general adult medical examination with abnormal findings: Secondary | ICD-10-CM | POA: Diagnosis not present

## 2021-02-15 DIAGNOSIS — Z0000 Encounter for general adult medical examination without abnormal findings: Secondary | ICD-10-CM

## 2021-02-15 DIAGNOSIS — Z931 Gastrostomy status: Secondary | ICD-10-CM | POA: Diagnosis not present

## 2021-02-15 DIAGNOSIS — G808 Other cerebral palsy: Secondary | ICD-10-CM

## 2021-02-15 NOTE — Progress Notes (Signed)
    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    Developmental History Developmental delay--Rett's syndrome  Function: Mobility: manual wheelchair Pain concerns: no Hand function:  Right: reduced dexterity  Left: reduced dexterity Spine curvature: mild Swallowing: normal and modified diet (pureed) Toileting: dependent   Equipment:  Wheelchair   Hoyer lift Chest vest Suction tubes and suction machine Nebulizer Pulse ox Oxygen tubes and tank Diapers Wipes and gloves G -tube button with Tubings Non invasive vent--benefits form and using vent therapy   Oral feeds--via G tube Osmolite--1.5 cals. Bolus 240 mls and bite sized pieces of food with regular liquids.  Therapy----none   Specialists- [facility]--[facility]   Review of Systems: Vision: impaired Hearing: impaired Seizures: yes - controlled Constipation: no GE reflux: yes  Fractures: no  The following portions of the patient's history were reviewed and updated as appropriate: allergies, current medications, past family history, past medical history, past social history, past surgical history and problem list.  Referrals needed-- DERMATOLOGY  Objective:    Physical Exam Wt 45.4 kg Height--59 in Cognition: non-interactive Respiratory: normal, no increased effort Lower extremity function:In wheelchair Abdomen: normal-- G tube present Spine scoliosis: mild --with rod Sitting Ability: assisted Gait: wheelchair    Assessment:   Developmental delay with Rett's Syndrome   Plan:    1. Gross motor: delayed 2. Fine motor/ADL: delayed 3. Educational/vocational: n/a 4. Transition skills: n/a 5.  Speech/swallowing: no speech, GERD  Refer to dermatology for rash to both hands and nails

## 2021-02-15 NOTE — Telephone Encounter (Signed)
Prescription sent by Dr. Ardyth Man failed to transmit and sending in another.

## 2021-02-15 NOTE — Patient Instructions (Signed)
Seizure, Adult A seizure is a sudden burst of abnormal electrical and chemical activity in the brain. Seizures usually last from 30 seconds to 2 minutes.  What are the causes? Common causes of this condition include:  Fever or infection.  Problems that affect the brain. These may include: ? A brain or head injury. ? Bleeding in the brain. ? A brain tumor.  Low levels of blood sugar or salt.  Kidney problems or liver problems.  Conditions that are passed from parent to child (are inherited).  Problems with a substance, such as: ? Having a reaction to a drug or a medicine. ? Stopping the use of a substance all of a sudden (withdrawal).  A stroke.  Disorders that affect how you develop. Sometimes, the cause may not be known.  What increases the risk?  Having someone in your family who has epilepsy. In this condition, seizures happen again and again over time. They have no clear cause.  Having had a tonic-clonic seizure before. This type of seizure causes you to: ? Tighten the muscles of the whole body. ? Lose consciousness.  Having had a head injury or strokes before.  Having had a lack of oxygen at birth. What are the signs or symptoms? There are many types of seizures. The symptoms vary depending on the type of seizure you have. Symptoms during a seizure  Shaking that you cannot control (convulsions) with fast, jerky movements of muscles.  Stiffness of the body.  Breathing problems.  Feeling mixed up (confused).  Staring or not responding to sound or touch.  Head nodding.  Eyes that blink, flutter, or move fast.  Drooling, grunting, or making clicking sounds with your mouth  Losing control of when you pee or poop. Symptoms before a seizure  Feeling afraid, nervous, or worried.  Feeling like you may vomit.  Feeling like: ? You are moving when you are not. ? Things around you are moving when they are not.  Feeling like you saw or heard something before  (dj vu).  Odd tastes or smells.  Changes in how you see. You may see flashing lights or spots. Symptoms after a seizure  Feeling confused.  Feeling sleepy.  Headache.  Sore muscles. How is this treated? If your seizure stops on its own, you will not need treatment. If your seizure lasts longer than 5 minutes, you will normally need treatment. Treatment may include:  Medicines given through an IV tube.  Avoiding things, such as medicines, that are known to cause your seizures.  Medicines to prevent seizures.  A device to prevent or control seizures.  Surgery.  A diet low in carbohydrates and high in fat (ketogenic diet). Follow these instructions at home: Medicines  Take over-the-counter and prescription medicines only as told by your doctor.  Avoid foods or drinks that may keep your medicine from working, such as alcohol. Activity  Follow instructions about driving, swimming, or doing things that would be dangerous if you had another seizure. Wait until your doctor says it is safe for you to do these things.  If you live in the [place] ask your local department of motor vehicles when you can drive.  Get a lot of rest. Teaching others  Teach friends and family what to do when you have a seizure. They should: ? Help you get down to the ground. ? Protect your head and body. ? Loosen any clothing around your neck. ? Turn you on your side. ? Know whether or not   you need emergency care. ? Stay with you until you are better.  Also, tell them what not to do if you have a seizure. Tell them: ? They should not hold you down. ? They should not put anything in your mouth.   General instructions  Avoid anything that gives you seizures.  Keep a seizure diary. Write down: ? What you remember about each seizure. ? What you think caused each seizure.  Keep all follow-up visits. Contact a doctor if:  You have another seizure or seizures. Call the doctor each time you  have a seizure.  The pattern of your seizures changes.  You keep having seizures with treatment.  You have symptoms of being sick or having an infection.  You are not able to take your medicine. Get help right away if:  You have any of these problems: ? A seizure that lasts longer than 5 minutes. ? Many seizures in a row and you do not feel better between seizures. ? A seizure that makes it harder to breathe. ? A seizure and you can no longer speak or use part of your body.  You do not wake up right after a seizure.  You get hurt during a seizure.  You feel confused or have pain right after a seizure. These symptoms may be an emergency. Get help right away. Call your local emergency services (911 in the [place]  Do not wait to see if the symptoms will go away.  Do not drive yourself to the hospital. Summary  A seizure is a sudden burst of abnormal electrical and chemical activity in the brain. Seizures normally last from 30 seconds to 2 minutes.  Causes of seizures include illness, injury to the head, low levels of blood sugar or salt, and certain conditions.  Most seizures will stop on their own in less than 5 minutes. Seizures that last longer than 5 minutes are a medical emergency and need treatment right away.  Many medicines are used to treat seizures. Take over-the-counter and prescription medicines only as told by your doctor. This information is not intended to replace advice given to you by your health care provider. Make sure you discuss any questions you have with your health care provider. Document Revised: 05/11/2020 Document Reviewed: 05/11/2020 Elsevier Patient Education  2021 Elsevier Inc.  

## 2021-02-19 ENCOUNTER — Encounter: Payer: Self-pay | Admitting: Pediatrics

## 2021-02-19 DIAGNOSIS — Z0000 Encounter for general adult medical examination without abnormal findings: Secondary | ICD-10-CM | POA: Insufficient documentation

## 2021-02-19 DIAGNOSIS — B351 Tinea unguium: Secondary | ICD-10-CM | POA: Insufficient documentation

## 2021-02-21 ENCOUNTER — Telehealth: Payer: Self-pay

## 2021-02-21 NOTE — Telephone Encounter (Signed)
PA sent in to medicaid --awaiting response

## 2021-02-21 NOTE — Telephone Encounter (Signed)
Needs clonazepam called in to the [facility] in [place]. [place] Spaniel it had been called in a week ago but the pharmacy says they need "pre-authorization". She then said that the pharmacy had somehow deleted the prescription.  Asked for a call back with any questions. [phone]

## 2021-02-27 NOTE — Telephone Encounter (Signed)
Approved and mom has meds

## 2021-03-21 ENCOUNTER — Other Ambulatory Visit: Payer: Self-pay | Admitting: Pediatrics

## 2021-05-01 ENCOUNTER — Other Ambulatory Visit: Payer: Self-pay

## 2021-05-01 ENCOUNTER — Encounter (HOSPITAL_BASED_OUTPATIENT_CLINIC_OR_DEPARTMENT_OTHER): Payer: Self-pay | Admitting: Emergency Medicine

## 2021-05-01 ENCOUNTER — Inpatient Hospital Stay (HOSPITAL_BASED_OUTPATIENT_CLINIC_OR_DEPARTMENT_OTHER)
Admission: EM | Admit: 2021-05-01 | Discharge: 2021-05-04 | DRG: 871 | Disposition: A | Discharge status: Home / Self Care | Payer: Medicaid Other | Attending: Internal Medicine | Admitting: Internal Medicine

## 2021-05-01 ENCOUNTER — Emergency Department (HOSPITAL_BASED_OUTPATIENT_CLINIC_OR_DEPARTMENT_OTHER): Payer: Medicaid Other

## 2021-05-01 DIAGNOSIS — U071 COVID-19: Secondary | ICD-10-CM | POA: Diagnosis present

## 2021-05-01 DIAGNOSIS — Z833 Family history of diabetes mellitus: Secondary | ICD-10-CM | POA: Diagnosis not present

## 2021-05-01 DIAGNOSIS — G40909 Epilepsy, unspecified, not intractable, without status epilepticus: Secondary | ICD-10-CM | POA: Diagnosis present

## 2021-05-01 DIAGNOSIS — A4189 Other specified sepsis: Principal | ICD-10-CM | POA: Diagnosis present

## 2021-05-01 DIAGNOSIS — Z888 Allergy status to other drugs, medicaments and biological substances status: Secondary | ICD-10-CM | POA: Diagnosis not present

## 2021-05-01 DIAGNOSIS — R1310 Dysphagia, unspecified: Secondary | ICD-10-CM | POA: Diagnosis present

## 2021-05-01 DIAGNOSIS — J9621 Acute and chronic respiratory failure with hypoxia: Secondary | ICD-10-CM | POA: Diagnosis present

## 2021-05-01 DIAGNOSIS — R532 Functional quadriplegia: Secondary | ICD-10-CM | POA: Diagnosis present

## 2021-05-01 DIAGNOSIS — J1282 Pneumonia due to coronavirus disease 2019: Secondary | ICD-10-CM | POA: Diagnosis present

## 2021-05-01 DIAGNOSIS — J9601 Acute respiratory failure with hypoxia: Secondary | ICD-10-CM | POA: Diagnosis not present

## 2021-05-01 DIAGNOSIS — Z880 Allergy status to penicillin: Secondary | ICD-10-CM

## 2021-05-01 DIAGNOSIS — G809 Cerebral palsy, unspecified: Secondary | ICD-10-CM | POA: Diagnosis present

## 2021-05-01 DIAGNOSIS — R0902 Hypoxemia: Secondary | ICD-10-CM

## 2021-05-01 DIAGNOSIS — Z7401 Bed confinement status: Secondary | ICD-10-CM

## 2021-05-01 DIAGNOSIS — G8250 Quadriplegia, unspecified: Secondary | ICD-10-CM | POA: Diagnosis not present

## 2021-05-01 DIAGNOSIS — Z79899 Other long term (current) drug therapy: Secondary | ICD-10-CM | POA: Diagnosis not present

## 2021-05-01 DIAGNOSIS — Z931 Gastrostomy status: Secondary | ICD-10-CM

## 2021-05-01 DIAGNOSIS — A419 Sepsis, unspecified organism: Secondary | ICD-10-CM | POA: Diagnosis present

## 2021-05-01 DIAGNOSIS — F842 Rett's syndrome: Secondary | ICD-10-CM | POA: Diagnosis present

## 2021-05-01 DIAGNOSIS — R569 Unspecified convulsions: Secondary | ICD-10-CM

## 2021-05-01 DIAGNOSIS — Z20822 Contact with and (suspected) exposure to covid-19: Secondary | ICD-10-CM | POA: Diagnosis present

## 2021-05-01 DIAGNOSIS — G808 Other cerebral palsy: Secondary | ICD-10-CM | POA: Diagnosis not present

## 2021-05-01 DIAGNOSIS — J9622 Acute and chronic respiratory failure with hypercapnia: Secondary | ICD-10-CM | POA: Diagnosis present

## 2021-05-01 LAB — I-STAT VENOUS BLOOD GAS, ED
Acid-Base Excess: 16 mmol/L — ABNORMAL HIGH (ref 0.0–2.0)
Bicarbonate: 45.1 mmol/L — ABNORMAL HIGH (ref 20.0–28.0)
Calcium, Ion: 1.28 mmol/L (ref 1.15–1.40)
HCT: 35 % — ABNORMAL LOW (ref 36.0–46.0)
Hemoglobin: 11.9 g/dL — ABNORMAL LOW (ref 12.0–15.0)
O2 Saturation: 59 %
Patient temperature: 99.7
Potassium: 3.9 mmol/L (ref 3.5–5.1)
Sodium: 134 mmol/L — ABNORMAL LOW (ref 135–145)
TCO2: 47 mmol/L — ABNORMAL HIGH (ref 22–32)
pCO2, Ven: 78.1 mmHg (ref 44.0–60.0)
pH, Ven: 7.372 (ref 7.250–7.430)
pO2, Ven: 35 mmHg (ref 32.0–45.0)

## 2021-05-01 LAB — BASIC METABOLIC PANEL
Anion gap: 6 (ref 5–15)
BUN: 5 mg/dL — ABNORMAL LOW (ref 6–20)
CO2: 43 mmol/L — ABNORMAL HIGH (ref 22–32)
Calcium: 9.9 mg/dL (ref 8.9–10.3)
Chloride: 89 mmol/L — ABNORMAL LOW (ref 98–111)
Creatinine, Ser: <0.3 mg/dL — ABNORMAL LOW (ref 0.44–1.00)
Glucose, Bld: 100 mg/dL — ABNORMAL HIGH (ref 70–99)
Potassium: 4 mmol/L (ref 3.5–5.1)
Sodium: 138 mmol/L (ref 135–145)

## 2021-05-01 LAB — CBC WITH DIFFERENTIAL/PLATELET
Abs Immature Granulocytes: 0.02 10*3/uL (ref 0.00–0.07)
Basophils Absolute: 0 10*3/uL (ref 0.0–0.1)
Basophils Relative: 0 %
Eosinophils Absolute: 0 10*3/uL (ref 0.0–0.5)
Eosinophils Relative: 0 %
HCT: 36.8 % (ref 36.0–46.0)
Hemoglobin: 12 g/dL (ref 12.0–15.0)
Immature Granulocytes: 0 %
Lymphocytes Relative: 18 %
Lymphs Abs: 1.1 10*3/uL (ref 0.7–4.0)
MCH: 33.4 pg (ref 26.0–34.0)
MCHC: 32.6 g/dL (ref 30.0–36.0)
MCV: 102.5 fL — ABNORMAL HIGH (ref 80.0–100.0)
Monocytes Absolute: 0.7 10*3/uL (ref 0.1–1.0)
Monocytes Relative: 11 %
Neutro Abs: 4.3 10*3/uL (ref 1.7–7.7)
Neutrophils Relative %: 71 %
Platelets: 146 10*3/uL — ABNORMAL LOW (ref 150–400)
RBC: 3.59 MIL/uL — ABNORMAL LOW (ref 3.87–5.11)
RDW: 12.4 % (ref 11.5–15.5)
WBC: 6.1 10*3/uL (ref 4.0–10.5)
nRBC: 0 % (ref 0.0–0.2)

## 2021-05-01 LAB — LACTIC ACID, PLASMA
Lactic Acid, Venous: 1.6 mmol/L (ref 0.5–1.9)
Lactic Acid, Venous: 1.5 mmol/L (ref 0.5–1.9)

## 2021-05-01 LAB — RESP PANEL BY RT-PCR (FLU A&B, COVID) ARPGX2
Influenza A by PCR: NEGATIVE
Influenza B by PCR: NEGATIVE
SARS Coronavirus 2 by RT PCR: POSITIVE — AB

## 2021-05-01 LAB — PROCALCITONIN: Procalcitonin: <0.1 ng/mL

## 2021-05-01 MED ORDER — CLONAZEPAM 0.125 MG PO TBDP: 0.2500 mg | ORAL_TABLET | Freq: Every day | ORAL | Status: DC | PRN

## 2021-05-01 MED ORDER — CETIRIZINE HCL 5 MG/5ML PO SOLN
10.0000 mg | Freq: Every day | ORAL | Status: DC
  Administered 2021-05-02 – 2021-05-04 (×3): 10 mg
  Filled 2021-05-01 (×3): qty 10

## 2021-05-01 MED ORDER — CLONAZEPAM 0.125 MG PO TBDP
0.5000 mg | ORAL_TABLET | Freq: Every day | ORAL | Status: DC
  Administered 2021-05-01 – 2021-05-03 (×3): 0.5 mg via ORAL
  Filled 2021-05-01 (×3): qty 4

## 2021-05-01 MED ORDER — CLONAZEPAM 0.125 MG PO TBDP
0.2500 mg | ORAL_TABLET | Freq: Every day | ORAL | Status: DC
  Administered 2021-05-02 – 2021-05-04 (×3): 0.25 mg via ORAL
  Filled 2021-05-01 (×3): qty 2

## 2021-05-01 MED ORDER — SODIUM CHLORIDE 0.9 % IV SOLN
INTRAVENOUS | Status: DC | PRN
  Administered 2021-05-01: 250 mL via INTRAVENOUS

## 2021-05-01 MED ORDER — LEVETIRACETAM 100 MG/ML PO SOLN
2000.0000 mg | Freq: Every day | ORAL | Status: DC
  Administered 2021-05-02 – 2021-05-04 (×3): 2000 mg
  Filled 2021-05-01 (×3): qty 20

## 2021-05-01 MED ORDER — LEVETIRACETAM 100 MG/ML PO SOLN
2250.0000 mg | Freq: Every day | ORAL | Status: DC
  Administered 2021-05-02 – 2021-05-03 (×2): 2250 mg via ORAL
  Filled 2021-05-01 (×4): qty 22.5

## 2021-05-01 MED ORDER — SODIUM CHLORIDE 0.9 % IV SOLN
1.0000 g | Freq: Once | INTRAVENOUS | Status: AC
  Administered 2021-05-01: 1 g via INTRAVENOUS
  Filled 2021-05-01: qty 10

## 2021-05-01 MED ORDER — ONDANSETRON HCL 4 MG PO TABS
4.0000 mg | ORAL_TABLET | Freq: Four times a day (QID) | ORAL | Status: DC | PRN
Start: 2021-05-01 — End: 2021-05-04

## 2021-05-01 MED ORDER — SODIUM CHLORIDE 0.9 % IV SOLN: 100.0000 mg | INTRAVENOUS | Status: AC

## 2021-05-01 MED ORDER — CANNABIDIOL 100 MG/ML PO SOLN
350.0000 mg | Freq: Two times a day (BID) | ORAL | Status: DC
  Administered 2021-05-01 – 2021-05-04 (×6): 350 mg

## 2021-05-01 MED ORDER — MUPIROCIN CALCIUM 2 % EX CREA
1.0000 "application " | TOPICAL_CREAM | Freq: Every day | CUTANEOUS | Status: DC | PRN
  Filled 2021-05-01: qty 15

## 2021-05-01 MED ORDER — PREDNISONE 50 MG PO TABS
50.0000 mg | ORAL_TABLET | Freq: Every day | ORAL | Status: DC
  Administered 2021-05-04: 50 mg via ORAL
  Filled 2021-05-01: qty 1

## 2021-05-01 MED ORDER — ENOXAPARIN SODIUM 30 MG/0.3ML IJ SOSY
30.0000 mg | PREFILLED_SYRINGE | INTRAMUSCULAR | Status: DC
  Administered 2021-05-01 – 2021-05-03 (×3): 30 mg via SUBCUTANEOUS
  Filled 2021-05-01 (×3): qty 0.3

## 2021-05-01 MED ORDER — ONDANSETRON HCL 4 MG/2ML IJ SOLN: 4.0000 mg | Freq: Four times a day (QID) | INTRAMUSCULAR | Status: DC | PRN

## 2021-05-01 MED ORDER — CLONAZEPAM 0.125 MG PO TBDP: 0.2500 mg | ORAL_TABLET | Freq: Two times a day (BID) | ORAL | Status: DC

## 2021-05-01 MED ORDER — DIVALPROEX SODIUM 125 MG PO CSDR: 375.0000 mg | DELAYED_RELEASE_CAPSULE | Freq: Every day | ORAL | Status: DC

## 2021-05-01 MED ORDER — METHYLPREDNISOLONE SODIUM SUCC 40 MG IJ SOLR
0.5000 mg/kg | Freq: Two times a day (BID) | INTRAMUSCULAR | Status: AC
  Administered 2021-05-01 – 2021-05-03 (×6): 21.2 mg via INTRAVENOUS
  Filled 2021-05-01 (×6): qty 1

## 2021-05-01 MED ORDER — DIVALPROEX SODIUM 125 MG PO CSDR
375.0000 mg | DELAYED_RELEASE_CAPSULE | ORAL | Status: DC
  Administered 2021-05-02 – 2021-05-04 (×5): 375 mg via ORAL

## 2021-05-01 MED ORDER — DIVALPROEX SODIUM 125 MG PO CSDR: 375.0000 mg | DELAYED_RELEASE_CAPSULE | ORAL | Status: DC

## 2021-05-01 MED ORDER — SODIUM CHLORIDE 0.9 % IV SOLN
100.0000 mg | Freq: Every day | INTRAVENOUS | Status: DC
  Administered 2021-05-02 – 2021-05-04 (×3): 100 mg via INTRAVENOUS
  Filled 2021-05-01 (×3): qty 20

## 2021-05-01 MED ORDER — POLYETHYLENE GLYCOL 3350 17 G PO PACK
17.0000 g | PACK | Freq: Every day | ORAL | Status: DC | PRN
  Administered 2021-05-01: 17 g
  Filled 2021-05-01: qty 1

## 2021-05-01 MED ORDER — LEVETIRACETAM 100 MG/ML PO SOLN: 2000.0000 mg | ORAL | Status: DC

## 2021-05-01 MED ORDER — SODIUM CHLORIDE 0.9 % IV SOLN
500.0000 mg | Freq: Once | INTRAVENOUS | Status: AC
  Administered 2021-05-01: 500 mg via INTRAVENOUS
  Filled 2021-05-01: qty 500

## 2021-05-01 MED ORDER — DIVALPROEX SODIUM 125 MG PO CSDR
500.0000 mg | DELAYED_RELEASE_CAPSULE | Freq: Every day | ORAL | Status: DC
  Administered 2021-05-02 – 2021-05-03 (×2): 500 mg via ORAL

## 2021-05-01 MED ORDER — SODIUM CHLORIDE 0.9 % IV SOLN
100.0000 mg | INTRAVENOUS | Status: AC
  Administered 2021-05-01 (×2): 100 mg via INTRAVENOUS

## 2021-05-01 MED ORDER — SODIUM CHLORIDE 0.9 % IV BOLUS
1000.0000 mL | Freq: Once | INTRAVENOUS | Status: AC
  Administered 2021-05-01: 1000 mL via INTRAVENOUS

## 2021-05-01 MED ORDER — LEVETIRACETAM 100 MG/ML PO SOLN
2250.0000 mg | Freq: Once | ORAL | Status: AC
  Administered 2021-05-01: 2250 mg
  Filled 2021-05-01: qty 22.5

## 2021-05-01 MED ORDER — ACETAMINOPHEN 160 MG/5ML PO SOLN
480.0000 mg | ORAL | Status: DC | PRN
  Administered 2021-05-04: 480 mg
  Filled 2021-05-01: qty 20.3

## 2021-05-01 MED ORDER — DIVALPROEX SODIUM 125 MG PO CSDR
500.0000 mg | DELAYED_RELEASE_CAPSULE | Freq: Every day | ORAL | Status: DC
  Administered 2021-05-01: 500 mg via ORAL
  Filled 2021-05-01: qty 4

## 2021-05-01 MED ORDER — SODIUM CHLORIDE 0.9 % IV BOLUS
500.0000 mL | Freq: Once | INTRAVENOUS | Status: AC
  Administered 2021-05-01: 500 mL via INTRAVENOUS

## 2021-05-01 NOTE — ED Notes (Signed)
Report give to Floor at [facility]. RN

## 2021-05-01 NOTE — ED Notes (Signed)
Pt is non verbal and responds by opening eyes. Mother and nurse at bed side and stated that Pt is at baseline thus GCS of 15 given. Pt will open eyes and look to name.

## 2021-05-01 NOTE — ED Notes (Signed)
Pt placed on 31/2 L now at 98%

## 2021-05-01 NOTE — ED Triage Notes (Signed)
Pt  from home complains of fever, a dry cough and SOB since Monday. Pt has had a fever ranging from 99 - 100.5.Caregiver has been giving ibuprofen every 6 hours. Pt was given 16 mL of ibuprofen this morning.  Pt is non-verbal and usually on 0.5 L on a dailey basis.

## 2021-05-01 NOTE — Plan of Care (Signed)
25 year old patient with history of Rett syndrome, cerebral palsy, G-tube feeding, pneumonia and chronic hypoxic respiratory failure typically requiring 0.5 L to 1 L of oxygen was brought into the emergency department due to shortness of breath and was diagnosed with left lower lobe pneumonia and was tested positive for COVID.  Patient meets sepsis criteria.  Patient has received 1 L of IV fluid and no antibiotics so far.  ED physician was recommended to complete full fluid bolus for sepsis protocol and check lactic acid and provide antibiotics as well.  Patient was accepted as a transfer to either [facility] or [facility] for admission under hospitalist service. [facility] will assume care on arrival to accepting facility. Until arrival, care as per EDP. However, [facility] available 24/7 for questions and assistance.

## 2021-05-01 NOTE — ED Notes (Signed)
Iv Infusing well arm looks good.

## 2021-05-01 NOTE — H&P (Signed)
History and Physical    Anahis Furgeson ZSM:[id number] DOB: [birth year] DOA: 05/01/2021  PCP: Georgiann Hahn, MD  Patient coming from: Home  I have personally briefly reviewed patient's old medical records in [facility]  Chief Complaint: SOB  HPI: Jo Matthews is a 25 y.o. female with medical history significant of Rett's syndrome, seizure disorder, G-tube feeding, non-verbal, chronic hypercapnic and hypoxic resp failure uses 0.5-1L O2 at baseline at home.  Has Trelegy at home per mother but not used in recent months since she develops seizure any time they use it.  Pt required up to 5L at home O2 last night.  Pt with worsening cough and SOB since yesterday.  Low-grade fevers of 100.5 degrees.  Ibuprofen every 6 hours.  Pt less active and more fatigued.  Pt non-verbal at baseline: unable to contribute to history.   ED Course: Initially treated as bacterial PNA / sepsis: given rocephin/azithro / IVF bolus.  WBC nl.  Tachy to 110s.  Lactate nl  Found to have COVID-19.  VBG demonstrates compensated hypercapnea with nl pH, bicarb 45   Review of Systems: Unable to perform, pt non-verbal at baseline.  Past Medical History:  Diagnosis Date   Fracture, humerus closed    G tube feedings (HCC)    Pneumonia    Recurrent fever of unknown cause 08/31/2012   Fever to 104-105 lasting 24 hrs, occuring once a week for the past 5 weeks (onset Sept 2013)   Rett's syndrome    Scoliosis    Seizure disorder (HCC)    Seizures (HCC)    Weight loss     Past Surgical History:  Procedure Laterality Date   BACK SURGERY     GASTROSTOMY TUBE PLACEMENT     SPINAL GROWTH RODS     SPINE SURGERY N/A    Phreesia 02/14/2021     reports that she has never smoked. She has never used smokeless tobacco. No history on file for alcohol use and drug use.  Allergies  Allergen Reactions   Cephalexin Other (See Comments)    seizure   Onfi [Clobazam]     Severe side effects   Other     Penicillins Shortness Of Breath    Family History  Problem Relation Age of Onset   Heart disease Other    Hyperlipidemia Other    Diabetes Other      Prior to Admission medications   Medication Sig Start Date End Date Taking? Authorizing Provider  acetaminophen (TYLENOL) 160 MG/5ML liquid Place 480 mg into feeding tube every 4 (four) hours as needed for fever.   Yes [provider]  cannabidiol (EPIDIOLEX) 100 MG/ML solution Place 350 mg into feeding tube in the morning and at bedtime. 03/11/21  Yes [provider]  cetirizine HCl (ZYRTEC) 1 MG/ML solution TAKE 10 MLS (10 MG TOTAL) BY MOUTH DAILY. Patient taking differently: Place 10 mg into feeding tube daily. 03/21/21 05/01/21 Yes Ramgoolam, Emeline Gins, MD  clonazePAM (KLONOPIN) 0.25 MG disintegrating tablet DISSOLVE 1 TABLET IN MOUTH IN THE AM, AND 2 TAB AT NIGHT. ALSO CAN TAKE ONE TAB AS NEEDED FOR BREAKTHROUGH SEIZURES. Patient taking differently: Place 0.25 mg into feeding tube 2 (two) times daily. 02/15/21  Yes Myles Gip, DO  diazepam (DIASTAT ACUDIAL) 10 MG GEL Place 12.5 mg rectally daily as needed for seizure (seizure lasting more than 5 minutes).   Yes [provider]  divalproex (DEPAKOTE SPRINKLE) 125 MG capsule Take 375-500 mg by mouth See admin instructions. Per  tube. Takes 3 capsules in the morning and afternoon and 4 capsules at night   Yes [provider]  guaiFENesin (ROBITUSSIN CHEST CONGESTION PO) Place 10 mLs into feeding tube daily as needed (cough).   Yes [provider]  ibuprofen (ADVIL) 100 MG/5ML suspension Place 300 mg into feeding tube every 4 (four) hours as needed for mild pain.   Yes [provider]  levETIRAcetam (KEPPRA) 100 MG/ML solution Place 2,000-2,250 mg into feeding tube See admin instructions. Takes 20 ml in the morning and 22.5 ml in the afternoon 01/08/18  Yes [provider]  mupirocin cream (BACTROBAN) 2 % Apply 1 application topically  daily as needed (irritation).    Yes [provider]  polyethylene glycol powder (GLYCOLAX/MIRALAX) powder TAKE 8.5 G BY MOUTH DAILY Patient taking differently: Place 1 Container into feeding tube daily as needed for mild constipation. 05/08/15  Yes Preston FleetingHooker, James B, MD  sodium chloride 0.9 % nebulizer solution Inhale 3 mLs into the lungs daily as needed for wheezing.   Yes [provider]  loratadine (CLARITIN) 5 MG/5ML syrup Place 5 mLs (5 mg total) into feeding tube 2 (two) times daily. Patient not taking: Reported on 05/01/2021 07/16/17 08/16/17  Georgiann Hahnamgoolam, Andres, MD  Multiple Vitamins-Minerals (MULTIVITAL) CHEW Chew 1 tablet by mouth every morning. Patient not taking: Reported on 05/01/2021 09/09/13 12/04/14  Georgiann Hahnamgoolam, Andres, MD  lacosamide (VIMPAT) 50 MG TABS tablet Take by mouth. Patient not taking: No sig reported 06/17/18 05/01/21  [provider]    Physical Exam: Vitals:   05/01/21 1530 05/01/21 1535 05/01/21 1545 05/01/21 1819  BP: (!) 87/52  (!) 92/54 107/77  Pulse: (!) 108  (!) 108 (!) 111  Resp: (!) 26  (!) 22 20  Temp:  99.1 F (37.3 C)  98.4 F (36.9 C)  TempSrc:  Oral  Oral  SpO2: 100%  97% 99%  Weight:        Constitutional: Ill appearing Eyes: PERRL, lids and conjunctivae normal ENMT: Mucous membranes are moist. Posterior pharynx clear of any exudate or lesions.Normal dentition.  Neck: normal, supple, no masses, no thyromegaly Respiratory: Decreased BS in LLL Cardiovascular: Tachycardic Abdomen: no tenderness, no masses palpated. No hepatosplenomegaly. Bowel sounds positive.  Musculoskeletal: Atrophy in all 4 limbs Skin: no rashes, lesions, ulcers. No induration Neurologic: Non-verbal, spastic quadriplegia. Psychiatric: Non-verbal   Labs on Admission: I have personally reviewed following labs and imaging studies  CBC: Recent Labs  Lab 05/01/21 0827 05/01/21 0920  WBC 6.1  --   NEUTROABS 4.3  --   HGB 12.0 11.9*  HCT 36.8 35.0*   MCV 102.5*  --   PLT 146*  --    Basic Metabolic Panel: Recent Labs  Lab 05/01/21 0827 05/01/21 0920  NA 138 134*  K 4.0 3.9  CL 89*  --   CO2 43*  --   GLUCOSE 100*  --   BUN 5*  --   CREATININE <0.30*  --   CALCIUM 9.9  --    GFR: CrCl cannot be calculated (This lab value cannot be used to calculate CrCl because it is not a number: <0.30). Liver Function Tests: No results for input(s): AST, ALT, ALKPHOS, BILITOT, PROT, ALBUMIN in the last 168 hours. No results for input(s): LIPASE, AMYLASE in the last 168 hours. No results for input(s): AMMONIA in the last 168 hours. Coagulation Profile: No results for input(s): INR, PROTIME in the last 168 hours. Cardiac Enzymes: No results for input(s): CKTOTAL, CKMB, CKMBINDEX,  TROPONINI in the last 168 hours. BNP (last 3 results) No results for input(s): PROBNP in the last 8760 hours. HbA1C: No results for input(s): HGBA1C in the last 72 hours. CBG: No results for input(s): GLUCAP in the last 168 hours. Lipid Profile: No results for input(s): CHOL, HDL, LDLCALC, TRIG, CHOLHDL, LDLDIRECT in the last 72 hours. Thyroid Function Tests: No results for input(s): TSH, T4TOTAL, FREET4, T3FREE, THYROIDAB in the last 72 hours. Anemia Panel: No results for input(s): VITAMINB12, FOLATE, FERRITIN, TIBC, IRON, RETICCTPCT in the last 72 hours. Urine analysis:    Component Value Date/Time   COLORURINE YELLOW 12/05/2014 0017   APPEARANCEUR CLEAR 12/05/2014 0017   LABSPEC 1.019 12/05/2014 0017   PHURINE 6.5 12/05/2014 0017   GLUCOSEU 500 (A) 12/05/2014 0017   HGBUR NEGATIVE 12/05/2014 0017   BILIRUBINUR NEGATIVE 12/05/2014 0017   BILIRUBINUR neg 07/27/2013 1517   KETONESUR 15 (A) 12/05/2014 0017   PROTEINUR NEGATIVE 12/05/2014 0017   UROBILINOGEN 0.2 12/05/2014 0017   NITRITE NEGATIVE 12/05/2014 0017   LEUKOCYTESUR NEGATIVE 12/05/2014 0017    Radiological Exams on Admission: DG Chest Portable 1 View  Result Date: 05/01/2021 CLINICAL  DATA:  Cough and shortness breath. EXAM: PORTABLE CHEST 1 VIEW COMPARISON:  Chest CT August 05, 2017 FINDINGS: The cardiac silhouette and left hemidiaphragm are obscured. Streaky peribronchial airspace opacities in the right lower lung. No acute osseous abnormality.  Spinal fusion rods as before. IMPRESSION: Left lower lobe airspace consolidation versus atelectasis. Left pleural effusion cannot be excluded. Streaky peribronchial opacities in the right lower lobe. Electronically Signed   By: Ted Mcalpine M.D.   On: 05/01/2021 09:01    EKG: Independently reviewed.  Assessment/Plan Principal Problem:   Acute hypoxemic respiratory failure due to COVID-19 [facility]) Active Problems:   Rett syndrome   G tube feedings (HCC)   Spastic quadriplegia (HCC)   Cerebral palsy, diplegic, infantile (HCC)   Sepsis (HCC)   Acute on chronic respiratory failure with hypoxia and hypercapnia (HCC)    Acute on chronic hypoxic resp failure due to COVID-19 - COVID pathway Remdesivir Steroids Only 2L requirement at the moment, holding off on immunomodulator for now Daily labs Pro-calcitonin pending Ordered CRP and D.Dimer on admission, but lab tech unable to get (pt very hard stick).  Hopefully can get in a few hours when its time for AM labs. Tylenol PRN fever ? Of sepsis - Sepsis seems less likely: probably has SIRS secondary to COVID-19 without superimposed bacterial sepsis. Got sepsis fluid bolus Got rocephin / azithro Cultures pending Checking procalcitonin, if procalcitonin positive: re-order rocephin Vito Backers.  Otherwise superimposed bacterial infection / sepsis in this setting becomes very unlikely. Acute on chronic resp failure - specifically Hypoxia component is acute on chronic with increased O2 requirement from baseline Hypercapnic component is chronic, compensated with nl pH on VBG today CP, Rett syndrome, spastic quadriplegia - Pt G tube dependent at baseline Dietary consult for tube  feedings Mother at bedside providing history Seizure disorder - Cont home seizure meds  DVT prophylaxis: Lovenox Code Status: Full code per mother Family Communication: Mother at bedside Disposition Plan: Home after recovery from COVID-19 Consults called: None Admission status: Admit to inpatient  Severity of Illness: The appropriate patient status for this patient is INPATIENT. Inpatient status is judged to be reasonable and necessary in order to provide the required intensity of service to ensure the patient's safety. The patient's presenting symptoms, physical exam findings, and initial radiographic and laboratory data in the context of their  chronic comorbidities is felt to place them at high risk for further clinical deterioration. Furthermore, it is not anticipated that the patient will be medically stable for discharge from the hospital within 2 midnights of admission. The following factors support the patient status of inpatient.   IP status due to increased O2 requirement from COVID-19 PNA.  Patient has acute respiratory failure with hypoxia due to having a new oxygen requirement.  That is the patient has a PaO2 < 60 (pulse Ox < 90%) on room air.   Pt also special needs as documented above.  Patient has functional quadriplegia.  Despite not having a cord injury, this patient is bedridden and requires total care and assistance with mobility, feeding, grooming and other ADLs.   * I certify that at the point of admission it is my clinical judgment that the patient will require inpatient hospital care spanning beyond 2 midnights from the point of admission due to high intensity of service, high risk for further deterioration and high frequency of surveillance required.*   Avy Barlett M. DO [facility]  How to contact the [facility] Attending or Consulting provider 7A - 7P or covering provider during after hours 7P -7A, for this patient?  Check the care team in [facility] and look for  a) attending/consulting [facility] provider listed and b) the [facility] team listed Log into [email or web address]  Amion Physician Scheduling and messaging for groups and whole hospitals  On call and physician scheduling software for group practices, residents, hospitalists and other medical providers for call, clinic, rotation and shift schedules. OnCall [employer] is a hospital-wide system for scheduling doctors and paging doctors on call. EasyPlot is for scientific plotting and data analysis.  [email or web address]  and use [facility] universal password to access. If you do not have the password, please contact the hospital operator.  Locate the [facility] provider you are looking for under [facility] and page to a number that you can be directly reached. If you still have difficulty reaching the provider, please page the [facility] (Director on Call) for the Hospitalists listed on amion for assistance.  05/01/2021, 8:31 PM

## 2021-05-01 NOTE — ED Notes (Addendum)
Pt experienced an seizure for approximately 2 minutes, witnessed by Mother and care giver RN . IV also infiltrated and approximately  500 mL. Pulse good, extremity elevated and compress applied. Dr. Jodi Mourning notified. Suction had previously set up at bedside and seizure pads placed.

## 2021-05-01 NOTE — ED Notes (Signed)
CRITICAL VALUE STICKER  CRITICAL VALUE:COVID +  RECEIVER (on-site recipient of call):Carmon Ginsberg, RN  DATE & TIME NOTIFIED: 05-01-2021 0920  MESSENGER (representative from lab):  MD NOTIFIED: Dr. Gardiner Rhyme  TIME OF NOTIFICATION:0922  RESPONSE:

## 2021-05-01 NOTE — ED Notes (Signed)
Report called to [facility] °

## 2021-05-01 NOTE — ED Provider Notes (Signed)
[facility] EMERGENCY DEPT Provider Note   CSN: [id number] Arrival date & time: 05/01/21  0735     History Chief Complaint  Patient presents with   Shortness of Breath    Jo Matthews is a 25 y.o. female.  Patient with history of Rett syndrome, cerebral palsy, G-tube feeding, pneumonia presents with increased O2 requirement.  Patient normally is on 0.5 L however required up to 5 L last night.  Patient's had worsening cough and shortness of breath since yesterday with low-grade fevers 100.5 degrees.  Ibuprofen has been given every 6 hours.  Patient less active more fatigued.  G-tube feeds less amount.      Past Medical History:  Diagnosis Date   Fracture, humerus closed    G tube feedings (HCC)    Pneumonia    Recurrent fever of unknown cause 08/31/2012   Fever to 104-105 lasting 24 hrs, occuring once a week for the past 5 weeks (onset Sept 2013)   Rett's syndrome    Scoliosis    Seizure disorder (HCC)    Seizures (HCC)    Weight loss     Patient Active Problem List   Diagnosis Date Noted   Sepsis (HCC) 05/01/2021   Acute on chronic respiratory failure with hypoxia and hypercapnia (HCC) 05/01/2021   Acute hypoxemic respiratory failure due to COVID-19 (HCC) 05/01/2021   Tinea unguium 02/19/2021   Annual physical exam 02/19/2021   BMI (body mass index), pediatric, less than 5th percentile for age 81/17/2020   Cerebral palsy, diplegic, 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     Family History  Problem  Relation Age of Onset   Heart disease Other    Hyperlipidemia Other    Diabetes Other     Social History   Tobacco Use   Smoking status: Never   Smokeless tobacco: Never    Home Medications Prior to Admission medications   Medication Sig Start Date End Date Taking? Authorizing Provider  acetaminophen (TYLENOL) 160 MG/5ML liquid Place 480 mg into feeding tube every 4 (four) hours as needed for fever.   Yes [provider]  cannabidiol (EPIDIOLEX) 100 MG/ML solution Place 350 mg into feeding tube in the morning and at bedtime. 03/11/21  Yes [provider]  clonazePAM (KLONOPIN) 0.25 MG disintegrating tablet DISSOLVE 1 TABLET IN MOUTH IN THE AM, AND 2 TAB AT NIGHT. ALSO CAN TAKE ONE TAB AS NEEDED FOR BREAKTHROUGH SEIZURES. Patient taking differently: Place 0.25 mg into feeding tube 2 (two) times daily. 02/15/21  Yes Myles Gip, DO  diazepam (DIASTAT ACUDIAL) 10 MG GEL Place 12.5 mg rectally daily as needed for seizure (seizure lasting more than 5 minutes).   Yes [provider]  divalproex (DEPAKOTE SPRINKLE) 125 MG capsule Take 375-500 mg by mouth See admin instructions. Per tube. Takes 3 capsules in the morning and afternoon and 4 capsules at night   Yes [provider]  guaiFENesin (ROBITUSSIN CHEST CONGESTION PO) Place 10 mLs into feeding  tube daily as needed (cough).   Yes [provider]  ibuprofen (ADVIL) 100 MG/5ML suspension Place 300 mg into feeding tube every 4 (four) hours as needed for mild pain.   Yes [provider]  levETIRAcetam (KEPPRA) 100 MG/ML solution Place 2,000-2,250 mg into feeding tube See admin instructions. Takes 20 ml in the morning and 22.5 ml in the afternoon 01/08/18  Yes [provider]  mupirocin cream (BACTROBAN) 2 % Apply 1 application topically daily as needed (irritation).    Yes [provider]  predniSONE 5 MG/5ML solution Take 30 mLs (30 mg total) by mouth daily with breakfast  for 7 days. 05/04/21 05/11/21 Yes Glade Lloyd, MD  sodium chloride 0.9 % nebulizer solution Inhale 3 mLs into the lungs daily as needed for wheezing.   Yes [provider]  Calcium Carbonate Antacid (CALCIUM CARBONATE, DOSED IN MG ELEMENTAL CALCIUM,) 1250 MG/5ML SUSP Place 2 mLs (200 mg of elemental calcium total) into feeding tube 3 (three) times daily as needed for indigestion. 05/04/21   Glade Lloyd, MD  cetirizine HCl (ZYRTEC) 1 MG/ML solution Place 10 mLs (10 mg total) into feeding tube daily. 05/04/21 06/04/21  Glade Lloyd, MD  polyethylene glycol powder (GLYCOLAX/MIRALAX) 17 GM/SCOOP powder Place 255 g into feeding tube daily as needed for mild constipation. 05/04/21   Glade Lloyd, MD  lacosamide (VIMPAT) 50 MG TABS tablet Take by mouth. Patient not taking: No sig reported 06/17/18 05/01/21  [provider]    Allergies    Cephalexin, Onfi [clobazam], Other, and Penicillins  Review of Systems   Review of Systems  Unable to perform ROS: Patient nonverbal   Physical Exam Updated Vital Signs BP 106/89 (BP Location: Right Arm)   Pulse 81   Temp 97.8 F (36.6 C) (Axillary)   Resp 18   Ht 4\' 11"  (1.499 m)   Wt 42.2 kg   LMP 03/31/2021   SpO2 98%   BMI 18.79 kg/m   Physical Exam Vitals and nursing note reviewed.  Constitutional:      General: She is not in acute distress.    Appearance: She is well-developed.  HENT:     Head: Normocephalic and atraumatic.     Mouth/Throat:     Mouth: Mucous membranes are dry.  Eyes:     General:        Right eye: No discharge.        Left eye: No discharge.     Conjunctiva/sclera: Conjunctivae normal.  Neck:     Trachea: No tracheal deviation.  Cardiovascular:     Rate and Rhythm: Regular rhythm. Tachycardia present.     Heart sounds: No murmur heard. Pulmonary:     Effort: Pulmonary effort is normal. No respiratory distress.     Breath sounds: Examination of the right-lower field reveals decreased breath  sounds. Examination of the left-lower field reveals decreased breath sounds. Decreased breath sounds present.  Abdominal:     General: There is no distension.     Palpations: Abdomen is soft.     Tenderness: There is no abdominal tenderness. There is no guarding.  Musculoskeletal:     Cervical back: Neck supple.     Right lower leg: No tenderness.     Left lower leg: No tenderness.  Skin:    General: Skin is warm.     Capillary Refill: Capillary refill takes less than 2 seconds.     Findings: No rash.  Neurological:     Mental Status: She is  alert.     Comments: General weakness, retts syndrome, non verbal  Psychiatric:     Comments: Non verbal Retts syndrome    ED Results / Procedures / Treatments   Labs (all labs ordered are listed, but only abnormal results are displayed) Labs Reviewed  RESP PANEL BY RT-PCR (FLU A&B, COVID) ARPGX2 - Abnormal; Notable for the following components:      Result Value   SARS Coronavirus 2 by RT PCR POSITIVE (*)    All other components within normal limits  CBC WITH DIFFERENTIAL/PLATELET - Abnormal; Notable for the following components:   RBC 3.59 (*)    MCV 102.5 (*)    Platelets 146 (*)    All other components within normal limits  BASIC METABOLIC PANEL - Abnormal; Notable for the following components:   Chloride 89 (*)    CO2 43 (*)    Glucose, Bld 100 (*)    BUN 5 (*)    Creatinine, Ser <0.30 (*)    All other components within normal limits  CBC WITH DIFFERENTIAL/PLATELET - Abnormal; Notable for the following components:   WBC 13.7 (*)    RBC 2.85 (*)    Hemoglobin 9.3 (*)    HCT 30.5 (*)    MCV 107.0 (*)    Neutro Abs 12.9 (*)    Lymphs Abs 0.4 (*)    All other components within normal limits  COMPREHENSIVE METABOLIC PANEL - Abnormal; Notable for the following components:   Chloride 96 (*)    CO2 38 (*)    Glucose, Bld 154 (*)    BUN 5 (*)    Creatinine, Ser <0.30 (*)    Calcium 8.2 (*)    Total Protein 5.9 (*)    Albumin  2.7 (*)    AST 14 (*)    Alkaline Phosphatase 37 (*)    All other components within normal limits  C-REACTIVE PROTEIN - Abnormal; Notable for the following components:   CRP 13.9 (*)    All other components within normal limits  COMPREHENSIVE METABOLIC PANEL - Abnormal; Notable for the following components:   Chloride 97 (*)    CO2 39 (*)    Glucose, Bld 168 (*)    Creatinine, Ser <0.30 (*)    Total Protein 5.6 (*)    Albumin 2.4 (*)    All other components within normal limits  C-REACTIVE PROTEIN - Abnormal; Notable for the following components:   CRP 4.0 (*)    All other components within normal limits  GLUCOSE, CAPILLARY - Abnormal; Notable for the following components:   Glucose-Capillary 159 (*)    All other components within normal limits  GLUCOSE, CAPILLARY - Abnormal; Notable for the following components:   Glucose-Capillary 153 (*)    All other components within normal limits  CBC WITH DIFFERENTIAL/PLATELET - Abnormal; Notable for the following components:   RBC 3.15 (*)    Hemoglobin 10.5 (*)    HCT 33.6 (*)    MCV 106.7 (*)    Neutro Abs 8.0 (*)    All other components within normal limits  GLUCOSE, CAPILLARY - Abnormal; Notable for the following components:   Glucose-Capillary 171 (*)    All other components within normal limits  BLOOD GAS, VENOUS - Abnormal; Notable for the following components:   pCO2, Ven 82.8 (*)    pO2, Ven 53.2 (*)    Bicarbonate 40.1 (*)    Acid-Base Excess 11.5 (*)    All other components within normal limits  GLUCOSE, CAPILLARY - Abnormal; Notable for the following components:   Glucose-Capillary 179 (*)    All other components within normal limits  GLUCOSE, CAPILLARY - Abnormal; Notable for the following components:   Glucose-Capillary 213 (*)    All other components within normal limits  GLUCOSE, CAPILLARY - Abnormal; Notable for the following components:   Glucose-Capillary 219 (*)    All other components within normal limits   GLUCOSE, CAPILLARY - Abnormal; Notable for the following components:   Glucose-Capillary 224 (*)    All other components within normal limits  GLUCOSE, CAPILLARY - Abnormal; Notable for the following components:   Glucose-Capillary 233 (*)    All other components within normal limits  GLUCOSE, CAPILLARY - Abnormal; Notable for the following components:   Glucose-Capillary 144 (*)    All other components within normal limits  GLUCOSE, CAPILLARY - Abnormal; Notable for the following components:   Glucose-Capillary 290 (*)    All other components within normal limits  I-STAT VENOUS BLOOD GAS, ED - Abnormal; Notable for the following components:   pCO2, Ven 78.1 (*)    Bicarbonate 45.1 (*)    TCO2 47 (*)    Acid-Base Excess 16.0 (*)    Sodium 134 (*)    HCT 35.0 (*)    Hemoglobin 11.9 (*)    All other components within normal limits  CULTURE, BLOOD (ROUTINE X 2)  CULTURE, BLOOD (ROUTINE X 2) W REFLEX TO ID PANEL  LACTIC ACID, PLASMA  D-DIMER, QUANTITATIVE  PROCALCITONIN  LACTIC ACID, PLASMA  D-DIMER, QUANTITATIVE  MAGNESIUM  FERRITIN  GLUCOSE, CAPILLARY  CBC WITH DIFFERENTIAL/PLATELET    EKG None  Radiology No results found.  Procedures Ultrasound ED Peripheral IV (Provider)  Date/Time: 05/01/2021 10:01 AM Performed by: Blane Ohara, MD Authorized by: Blane Ohara, MD   Procedure details:    Indications: hydration and multiple failed IV attempts     Location:  Right AC   Angiocath:  20 G   Bedside Ultrasound Guided: Yes     Images: archived     Patient tolerated procedure without complications: Yes     Dressing applied: Yes   .Critical Care  Date/Time: 05/01/2021 10:02 AM Performed by: Blane Ohara, MD Authorized by: Blane Ohara, MD   Critical care provider statement:    Critical care time (minutes):  40   Critical care start time:  05/01/2021 9:00 AM   Critical care end time:  05/01/2021 9:40 AM   Critical care time was exclusive of:  Separately  billable procedures and treating other patients and teaching time   Critical care was necessary to treat or prevent imminent or life-threatening deterioration of the following conditions:  Respiratory failure   Critical care was time spent personally by me on the following activities:  Evaluation of patient's response to treatment, examination of patient, ordering and performing treatments and interventions, ordering and review of laboratory studies, ordering and review of radiographic studies, pulse oximetry, re-evaluation of patient's condition, obtaining history from patient or surrogate and review of old charts   Medications Ordered in ED Medications  remdesivir 100 mg in sodium chloride 0.9 % 100 mL IVPB (100 mg Intravenous Not Given 05/01/21 1533)  sodium chloride 0.9 % bolus 1,000 mL (0 mLs Intravenous Stopped 05/01/21 1117)  cefTRIAXone (ROCEPHIN) 1 g in sodium chloride 0.9 % 100 mL IVPB (0 g Intravenous Stopped 05/01/21 1232)  azithromycin (ZITHROMAX) 500 mg in sodium chloride 0.9 % 250 mL IVPB (0 mg Intravenous Stopped 05/01/21 1532)  sodium  chloride 0.9 % bolus 500 mL (0 mLs Intravenous Stopped 05/01/21 1332)  methylPREDNISolone sodium succinate (SOLU-MEDROL) 40 mg/mL injection 21.2 mg (21.2 mg Intravenous Given 05/03/21 2122)  remdesivir 100 mg in sodium chloride 0.9 % 100 mL IVPB (0 mg Intravenous Stopped 05/01/21 1533)  levETIRAcetam (KEPPRA) 100 MG/ML solution 2,250 mg (2,250 mg Per Tube Given 05/01/21 2312)  aerochamber plus with mask device 1 each (1 each Other Given 05/03/21 0041)    ED Course  I have reviewed the triage vital signs and the nursing notes.  Pertinent labs & imaging results that were available during my care of the patient were reviewed by me and considered in my medical decision making (see chart for details).    MDM Rules/Calculators/A&P                          Patient with Rett syndrome on home oxygen presents with worsening shortness of breath and cough.   Concern clinically for pneumonia Jo Matthews versus bacterial source.  Plan for blood culture, blood work, chest x-ray.  Difficult IV plan to assist with ultrasound-guided IV. Ultrasound IV obtained.  Blood work ordered and reviewed showing sodium 134, hemoglobin 11.9.  IV fluid bolus given for clinical signs of dehydration, decreased GI feedings recently and secondary losses from infection.  COVID test returned positive.  Patient stable on 4 L nasal cannula, mild tachycardia and mild tachypnea.  Chest x-ray reviewed showing possible infiltrate on the left.  Paged hospitalist to discuss transfer/admission for further monitoring, oxygen and consideration of antibiotics.  Patient had brief seizure and resolved, suction at bedside.  IV infiltrated plan for repeat ultrasound-guided IV.  Jaiyah Beining was evaluated in Emergency Department on 05/07/2021 for the symptoms described in the history of present illness. She was evaluated in the context of the global COVID-19 pandemic, which necessitated consideration that the patient might be at risk for infection with the SARS-CoV-2 virus that causes COVID-19. Institutional protocols and algorithms that pertain to the evaluation of patients at risk for COVID-19 are in a state of rapid change based on information released by regulatory bodies including the CDC and federal and state organizations. These policies and algorithms were followed during the patient's care in the ED.  Final Clinical Impression(s) / ED Diagnoses Final diagnoses:  Pneumonia due to COVID-19 virus  Hypoxia  Seizure (HCC)    Rx / DC Orders ED Discharge Orders          Ordered    cetirizine HCl (ZYRTEC) 1 MG/ML solution  Daily        05/04/21 1107    polyethylene glycol powder (GLYCOLAX/MIRALAX) 17 GM/SCOOP powder  Daily PRN        05/04/21 1107    Calcium Carbonate Antacid (CALCIUM CARBONATE, DOSED IN MG ELEMENTAL CALCIUM,) 1250 MG/5ML SUSP  3 times daily PRN        05/04/21  1107    predniSONE 5 MG/5ML solution  Daily with breakfast        05/04/21 1107    Increase activity slowly        05/04/21 1107    Diet - low sodium heart healthy        05/04/21 1107             Blane Ohara, MD 05/07/21 0000

## 2021-05-01 NOTE — ED Notes (Signed)
Sweeling on right arm is much better and continues to improve. Pulse good , cap refill wdl

## 2021-05-01 NOTE — ED Notes (Signed)
Still unable to get IV access

## 2021-05-02 DIAGNOSIS — F842 Rett's syndrome: Secondary | ICD-10-CM

## 2021-05-02 DIAGNOSIS — J9621 Acute and chronic respiratory failure with hypoxia: Secondary | ICD-10-CM

## 2021-05-02 DIAGNOSIS — J9622 Acute and chronic respiratory failure with hypercapnia: Secondary | ICD-10-CM

## 2021-05-02 DIAGNOSIS — U071 COVID-19: Secondary | ICD-10-CM

## 2021-05-02 DIAGNOSIS — Z931 Gastrostomy status: Secondary | ICD-10-CM

## 2021-05-02 DIAGNOSIS — G808 Other cerebral palsy: Secondary | ICD-10-CM

## 2021-05-02 DIAGNOSIS — J9601 Acute respiratory failure with hypoxia: Secondary | ICD-10-CM

## 2021-05-02 DIAGNOSIS — A419 Sepsis, unspecified organism: Secondary | ICD-10-CM

## 2021-05-02 LAB — CBC WITH DIFFERENTIAL/PLATELET
Abs Immature Granulocytes: 0 10*3/uL (ref 0.00–0.07)
Band Neutrophils: 8 %
Basophils Absolute: 0 10*3/uL (ref 0.0–0.1)
Basophils Relative: 0 %
Eosinophils Absolute: 0 10*3/uL (ref 0.0–0.5)
Eosinophils Relative: 0 %
HCT: 30.5 % — ABNORMAL LOW (ref 36.0–46.0)
Hemoglobin: 9.3 g/dL — ABNORMAL LOW (ref 12.0–15.0)
Lymphocytes Relative: 3 %
Lymphs Abs: 0.4 10*3/uL — ABNORMAL LOW (ref 0.7–4.0)
MCH: 32.6 pg (ref 26.0–34.0)
MCHC: 30.5 g/dL (ref 30.0–36.0)
MCV: 107 fL — ABNORMAL HIGH (ref 80.0–100.0)
Monocytes Absolute: 0.4 10*3/uL (ref 0.1–1.0)
Monocytes Relative: 3 %
Neutro Abs: 12.9 10*3/uL — ABNORMAL HIGH (ref 1.7–7.7)
Neutrophils Relative %: 86 %
Platelets: 160 10*3/uL (ref 150–400)
RBC: 2.85 MIL/uL — ABNORMAL LOW (ref 3.87–5.11)
RDW: 12.6 % (ref 11.5–15.5)
WBC: 13.7 10*3/uL — ABNORMAL HIGH (ref 4.0–10.5)
nRBC: 0 % (ref 0.0–0.2)

## 2021-05-02 LAB — COMPREHENSIVE METABOLIC PANEL
ALT: 10 U/L (ref 0–44)
AST: 14 U/L — ABNORMAL LOW (ref 15–41)
Albumin: 2.7 g/dL — ABNORMAL LOW (ref 3.5–5.0)
Alkaline Phosphatase: 37 U/L — ABNORMAL LOW (ref 38–126)
Anion gap: 7 (ref 5–15)
BUN: 5 mg/dL — ABNORMAL LOW (ref 6–20)
CO2: 38 mmol/L — ABNORMAL HIGH (ref 22–32)
Calcium: 8.2 mg/dL — ABNORMAL LOW (ref 8.9–10.3)
Chloride: 96 mmol/L — ABNORMAL LOW (ref 98–111)
Creatinine, Ser: <0.3 mg/dL — ABNORMAL LOW (ref 0.44–1.00)
Glucose, Bld: 154 mg/dL — ABNORMAL HIGH (ref 70–99)
Potassium: 4 mmol/L (ref 3.5–5.1)
Sodium: 141 mmol/L (ref 135–145)
Total Bilirubin: 0.3 mg/dL (ref 0.3–1.2)
Total Protein: 5.9 g/dL — ABNORMAL LOW (ref 6.5–8.1)

## 2021-05-02 LAB — BLOOD GAS, VENOUS
Acid-Base Excess: 11.5 mmol/L — ABNORMAL HIGH (ref 0.0–2.0)
Bicarbonate: 40.1 mmol/L — ABNORMAL HIGH (ref 20.0–28.0)
O2 Saturation: 82.1 %
Patient temperature: 98.6
pCO2, Ven: 82.8 mmHg (ref 44.0–60.0)
pH, Ven: 7.306 (ref 7.250–7.430)
pO2, Ven: 53.2 mmHg — ABNORMAL HIGH (ref 32.0–45.0)

## 2021-05-02 LAB — GLUCOSE, CAPILLARY: Glucose-Capillary: 159 mg/dL — ABNORMAL HIGH (ref 70–99)

## 2021-05-02 LAB — C-REACTIVE PROTEIN: CRP: 13.9 mg/dL — ABNORMAL HIGH (ref <1.0)

## 2021-05-02 LAB — D-DIMER, QUANTITATIVE: D-Dimer, Quant: 0.37 ug/mL-FEU (ref 0.00–0.50)

## 2021-05-02 MED ORDER — OSMOLITE 1.2 CAL PO LIQD: 1000.0000 mL | ORAL | Status: DC

## 2021-05-02 MED ORDER — GUAIFENESIN-DM 100-10 MG/5ML PO SYRP
5.0000 mL | ORAL_SOLUTION | ORAL | Status: DC | PRN
  Administered 2021-05-02 – 2021-05-03 (×3): 5 mL via ORAL
  Filled 2021-05-02 (×4): qty 10

## 2021-05-02 MED ORDER — IPRATROPIUM-ALBUTEROL 20-100 MCG/ACT IN AERS
1.0000 | INHALATION_SPRAY | Freq: Four times a day (QID) | RESPIRATORY_TRACT | Status: DC | PRN
  Administered 2021-05-03: 1 via RESPIRATORY_TRACT
  Filled 2021-05-02: qty 4

## 2021-05-02 MED ORDER — FREE WATER
180.0000 mL | Freq: Four times a day (QID) | Status: DC
  Administered 2021-05-02 – 2021-05-04 (×7): 180 mL

## 2021-05-02 MED ORDER — OSMOLITE 1.5 CAL PO LIQD
237.0000 mL | Freq: Four times a day (QID) | ORAL | Status: DC
  Administered 2021-05-02 – 2021-05-04 (×5): 237 mL
  Filled 2021-05-02 (×10): qty 237

## 2021-05-02 MED ORDER — AEROCHAMBER PLUS FLO-VU MISC
1.0000 | Freq: Once | Status: AC
  Administered 2021-05-03: 1
  Filled 2021-05-02 (×3): qty 1

## 2021-05-02 MED ORDER — PROSOURCE TF PO LIQD
45.0000 mL | Freq: Every day | ORAL | Status: DC
  Administered 2021-05-02 – 2021-05-03 (×2): 45 mL
  Filled 2021-05-02 (×3): qty 45

## 2021-05-02 NOTE — Progress Notes (Signed)
Initial Nutrition Assessment RD working remotely.  DOCUMENTATION CODES:   Not applicable  INTERVENTION:  - recommend diet advancement vs SLP evaluation to determine ability to advance diet. - will order TF regimen and adjust if needed dependent on diet advancement and PO intakes.  - 1 carton (237 ml) Osmolite 1.5 QID with 45 ml Prosource TF once/day and 180 ml free water every 4 hours.  - this regimen will provide 1460 kcal (94% kcal need), 71 grams protein (89% protein need), and 1804 ml free water.    NUTRITION DIAGNOSIS:   Increased nutrient needs related to acute illness, catabolic illness (COVID-19 infection) as evidenced by estimated needs.  GOAL:   Patient will meet greater than or equal to 90% of their needs  MONITOR:   TF tolerance, Diet advancement, Labs, Weight trends  REASON FOR ASSESSMENT:   Consult Enteral/tube feeding initiation and management  ASSESSMENT:   25 y.o. female with medical history of Rett's syndrome, seizure disorder, PEG for TF, non-verbal, and 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 She presented to the ED due to worsening SOB and hypoxia with low-grade fevers. At the time of presentation she was started on IV abx d/t possible sepsis and bacterial PNA. In the ED she was found to be COVID-19 positive and was started on remdesivir and steroids.  She is noted to be non-verbal at baseline. RD working off campus today and able to talk to mom, who is currently at bedside, on the room phone.   At home Chicquita eats 3 large meals/day. Her last meal was 50% of breakfast yesterday. A typical breakfast for her is 2 waffles, 2 pieces of sausage, and 1/2 cup of milk. She does not have any chewing or swallowing difficulties that family has been able to detect and eats a regular diet with thin liquids at baseline. Foods  are cut into small bites for her.   She prefers consuming foods over drinking liquids. Mom is concerned about her being NPO during hospitalization and possibility of this leading to her losing the functional ability to eat.   She has a PEG which was initially placed ~15 years ago. TF via PEG is supplemental to meal intakes. She receives 1 carton (237 ml) of Osmolite 1.5 TID if she does not eat well at meals and receives 1/2 carton if she eats >/= 50% at a meal. She also receives 180 ml of water with each TF bolus in addition to two additional water boluses of 240-360 ml each/day.   Patient has a chronic cough which is never productive and which has not changed recently (not becoming more frequent, etc). She is always sitting up when eating and receiving TF boluses. Mom noted that she sometimes coughs when receiving TF but this does not happen every time and is no different than her typical cough.   Weight today is 93 lb and weight has been stable since 08/02/19 when she weighed 98 lb.  Generalized mild pitting edema documented in the edema section of flow sheet.     Labs reviewed; Cl: 96 mmol/l, BUN: 5 mg/dl, creatinine: 0.3 mg/dl, Ca: 8.2 mg/dl. Medications reviewed; 21.2 mg solu-medrol BID 6/15-6/17, 100 mg IV remdesivir x2 doses 6/15 and x1 dose/day 6/16-6/19.    NUTRITION - FOCUSED PHYSICAL EXAM:  Unable to complete at this time.  Diet Order:   Diet  Order             Diet NPO time specified  Diet effective now                   EDUCATION NEEDS:   No education needs have been identified at this time  Skin:  Skin Assessment: Reviewed RN Assessment  Last BM:  PTA/unknown  Height:   Ht Readings from Last 1 Encounters:  05/02/21 4\' 11"  (1.499 m)    Weight:   Wt Readings from Last 1 Encounters:  05/02/21 42.2 kg      Estimated Nutritional Needs:  Kcal:  1550-1800 kcal Protein:  80-95 grams Fluid:  >/= 1.8 L/day      05/04/21, MS, RD, LDN,  [facility] Inpatient Clinical Dietitian RD pager # available in [facility]  After hours/weekend pager # available in [facility]

## 2021-05-02 NOTE — Progress Notes (Signed)
Patient ID: Jo Matthews, female   DOB: [birth year], 25 y.o.   MRN: [id number]  PROGRESS NOTE    Jo Matthews  [id number]:[id number] DOB: [birth year] DOA: 05/01/2021 PCP: Georgiann Hahn, MD   Brief Narrative:  25 y.o. female with medical history significant of Rett's syndrome, seizure disorder, G-tube feeding, non-verbal, chronic hypercapnic and hypoxic respiratory failure on 0.5-1L O2 at baseline at home (has Trelegy at home per mother but not used in recent months since she develops seizure any time they use it) presented with worsening shortness of breath and hypoxia with low-grade fevers.  On presentation, she was initially started on IV antibiotics for possible sepsis and bacterial pneumonia.  Subsequently, she was tested positive for COVID-19 and started on remdesivir and steroids.  Assessment & Plan:    Acute on chronic hypoxic respiratory failure COVID-19 pneumonia -Chest x-ray on presentation showed left lower lobe airspace consolidation versus atelectasis.  Tested positive for COVID-19.  Procalcitonin less than 0.1 on presentation.  Currently on Rocephin and Zithromax.  Doubt that patient has bacterial pneumonia.  DC antibiotics.  Continue remdesivir and steroids for now -COVID-19 Labs  Recent Labs    05/02/21 0422  DDIMER 0.37  CRP 13.9*    Lab Results  Component Value Date   SARSCOV2NAA POSITIVE (A) 05/01/2021   -Follow daily inflammatory markers. -Required 5 L oxygen on presentation.  Normally uses 0.5 to 1 L of oxygen at home.  Currently on 2 L oxygen.  Sepsis: Present on admission Leukocytosis -Presented with fever, tachypnea, tachycardia, leukocytosis with possible pneumonia on x-ray.  Possibly from COVID-19 pneumonia.  Hemodynamically improving. -DC IV fluids.  Start tube feedings.  History of cerebral palsy/Rett syndrome/spastic quadriplegia/dysphagia status post G-tube feeding -Dietary consult to resume G-tube feedings.  Seizure disorder -Continue home antiseizure  medications  DVT prophylaxis: Lovenox Code Status: Full Family Communication: Mother at bedside Disposition Plan: Status is: Inpatient  Remains inpatient appropriate because:Inpatient level of care appropriate due to severity of illness  Dispo: The patient is from: Home              Anticipated d/c is to: Home              Patient currently is not medically stable to d/c.   Difficult to place patient No  Consultants: None  Procedures: None  Antimicrobials: Rocephin and Zithromax from 05/01/2021 onwards   Subjective: Patient seen and examined at bedside.  Nonverbal and does not respond to questions.  Mother present at bedside.  No seizures, vomiting reported by nursing staff.  Objective: Vitals:   05/01/21 2239 05/02/21 0000 05/02/21 0305 05/02/21 0654  BP: (!) 96/53  106/76 108/65  Pulse: 99  (!) 116 (!) 105  Resp: 18  18 18   Temp: 98.1 F (36.7 C)  98.2 F (36.8 C) 97.7 F (36.5 C)  TempSrc: Oral  Oral Oral  SpO2: 100%  96% 100%  Weight:  42.2 kg    Height:  4\' 11"  (1.499 m)      Intake/Output Summary (Last 24 hours) at 05/02/2021 1113 Last data filed at 05/02/2021 0700 Gross per 24 hour  Intake 1438.17 ml  Output 400 ml  Net 1038.17 ml   Filed Weights   05/01/21 0815 05/02/21 0000  Weight: 42.2 kg 42.2 kg    Examination:  General exam: Appears calm and comfortable.  Looks chronically ill.  Older than stated age.  Currently on 2 L oxygen via nasal cannula. Respiratory system: Bilateral decreased breath sounds at bases with scattered  crackles Cardiovascular system: S1 & S2 heard, tachycardic gastrointestinal system: Abdomen is nondistended, soft and nontender. Normal bowel sounds heard.  G-tube present. Extremities: No cyanosis, clubbing, edema  Central nervous system: Does not follow commands.  Nonverbal, spastic quadriplegia present.  Atrophic limbs present.   Skin: No obvious ecchymosis/lesions Psychiatry: Cannot be assessed because of mental  status.   Data Reviewed: I have personally reviewed following labs and imaging studies  CBC: Recent Labs  Lab 05/01/21 0827 05/01/21 0920 05/02/21 0422  WBC 6.1  --  13.7*  NEUTROABS 4.3  --  12.9*  HGB 12.0 11.9* 9.3*  HCT 36.8 35.0* 30.5*  MCV 102.5*  --  107.0*  PLT 146*  --  160   Basic Metabolic Panel: Recent Labs  Lab 05/01/21 0827 05/01/21 0920 05/02/21 0422  NA 138 134* 141  K 4.0 3.9 4.0  CL 89*  --  96*  CO2 43*  --  38*  GLUCOSE 100*  --  154*  BUN 5*  --  5*  CREATININE <0.30*  --  <0.30*  CALCIUM 9.9  --  8.2*   GFR: CrCl cannot be calculated (This lab value cannot be used to calculate CrCl because it is not a number: <0.30). Liver Function Tests: Recent Labs  Lab 05/02/21 0422  AST 14*  ALT 10  ALKPHOS 37*  BILITOT 0.3  PROT 5.9*  ALBUMIN 2.7*   No results for input(s): LIPASE, AMYLASE in the last 168 hours. No results for input(s): AMMONIA in the last 168 hours. Coagulation Profile: No results for input(s): INR, PROTIME in the last 168 hours. Cardiac Enzymes: No results for input(s): CKTOTAL, CKMB, CKMBINDEX, TROPONINI in the last 168 hours. BNP (last 3 results) No results for input(s): PROBNP in the last 8760 hours. HbA1C: No results for input(s): HGBA1C in the last 72 hours. CBG: No results for input(s): GLUCAP in the last 168 hours. Lipid Profile: No results for input(s): CHOL, HDL, LDLCALC, TRIG, CHOLHDL, LDLDIRECT in the last 72 hours. Thyroid Function Tests: No results for input(s): TSH, T4TOTAL, FREET4, T3FREE, THYROIDAB in the last 72 hours. Anemia Panel: No results for input(s): VITAMINB12, FOLATE, FERRITIN, TIBC, IRON, RETICCTPCT in the last 72 hours. Sepsis Labs: Recent Labs  Lab 05/01/21 1156 05/01/21 2000  PROCALCITON  --  <0.10  LATICACIDVEN 1.6  --     Recent Results (from the past 240 hour(s))  Blood culture (routine x 2)     Status: None (Preliminary result)   Collection Time: 05/01/21  8:27 AM   Specimen:  BLOOD RIGHT ARM  Result Value Ref Range Status   Specimen Description   Final    BLOOD RIGHT ARM Performed at [facility], [address] [place], [place] [address]    Special Requests   Final    Blood Culture adequate volume Performed at [facility], [address] [place], [place] [address]    Culture   Final    NO GROWTH < 24 HOURS Performed at [facility], [address], [place], [place] [address]    Report Status PENDING  Incomplete  Resp Panel by RT-PCR (Flu A&B, Covid) Nasopharyngeal Swab     Status: Abnormal   Collection Time: 05/01/21  8:28 AM   Specimen: Nasopharyngeal Swab; Nasopharyngeal(NP) swabs in vial transport medium  Result Value Ref Range Status   SARS Coronavirus 2 by RT PCR POSITIVE (A) NEGATIVE Final    Comment: RESULT CALLED TO, READ BACK BY AND VERIFIED WITH: CT  Electa Sniff @ [phone]. 05/01/21. KLJ. (NOTE) SARS-CoV-2 target nucleic acids are DETECTED.  The SARS-CoV-2 RNA is generally detectable in upper respiratory specimens during the acute phase of infection. Positive results are indicative of the presence of the identified virus, but do not rule out bacterial infection or co-infection with other pathogens not detected by the test. Clinical correlation with patient history and other diagnostic information is necessary to determine patient infection status. The expected result is Negative.  Fact Sheet for Patients: BloggerCourse.com  Fact Sheet for Healthcare Providers: SeriousBroker.it  This test is not yet approved or cleared by the [place] FDA and  has been authorized for detection and/or diagnosis of SARS-CoV-2 by FDA under an Emergency Use Authorization (EUA).  This EUA will remain in effect (meaning this test can  be used) for the duration of  the COVID-19 declaration under Section 564(b)(1) of the Act, 21 U.S.C. section 360bbb-3(b)(1),  unless the authorization is terminated or revoked sooner.     Influenza A by PCR NEGATIVE NEGATIVE Final   Influenza B by PCR NEGATIVE NEGATIVE Final    Comment: (NOTE) The Xpert Xpress SARS-CoV-2/FLU/RSV plus assay is intended as an aid in the diagnosis of influenza from Nasopharyngeal swab specimens and should not be used as a sole basis for treatment. Nasal washings and aspirates are unacceptable for Xpert Xpress SARS-CoV-2/FLU/RSV testing.  Fact Sheet for Patients: BloggerCourse.com  Fact Sheet for Healthcare Providers: SeriousBroker.it  This test is not yet approved or cleared by the [place] FDA and has been authorized for detection and/or diagnosis of SARS-CoV-2 by FDA under an Emergency Use Authorization (EUA). This EUA will remain in effect (meaning this test can be used) for the duration of the COVID-19 declaration under Section 564(b)(1) of the Act, 21 U.S.C. section 360bbb-3(b)(1), unless the authorization is terminated or revoked.  Performed at [facility], [address], [place], [place] [address]          Radiology Studies: DG Chest Portable 1 View  Result Date: 05/01/2021 CLINICAL DATA:  Cough and shortness breath. EXAM: PORTABLE CHEST 1 VIEW COMPARISON:  Chest CT August 05, 2017 FINDINGS: The cardiac silhouette and left hemidiaphragm are obscured. Streaky peribronchial airspace opacities in the right lower lung. No acute osseous abnormality.  Spinal fusion rods as before. IMPRESSION: Left lower lobe airspace consolidation versus atelectasis. Left pleural effusion cannot be excluded. Streaky peribronchial opacities in the right lower lobe. Electronically Signed   By: Ted Mcalpine M.D.   On: 05/01/2021 09:01        Scheduled Meds:  cannabidiol  350 mg Per Tube BID   cetirizine HCl  10 mg Per Tube Daily   clonazepam  0.25 mg Oral Daily   And   clonazepam  0.5 mg Oral  QHS   divalproex  375 mg Oral 2 times per day   And   divalproex  500 mg Oral QHS   enoxaparin (LOVENOX) injection  30 mg Subcutaneous Q24H   levETIRAcetam  2,000 mg Per Tube QAC breakfast   And   levETIRAcetam  2,250 mg Oral q1600   methylPREDNISolone (SOLU-MEDROL) injection  0.5 mg/kg Intravenous BID   Followed by   Melene Muller ON 05/04/2021] predniSONE  50 mg Oral Daily   Continuous Infusions:  sodium chloride Stopped (05/01/21 1417)   remdesivir 100 mg in NS 100 mL 100 mg (05/02/21 0840)          Glade Lloyd, MD [facility] 05/02/2021, 11:13 AM

## 2021-05-02 NOTE — Plan of Care (Signed)
  Problem: Education: Goal: Knowledge of risk factors and measures for prevention of condition will improve Outcome: Progressing   Problem: Coping: Goal: Psychosocial and spiritual needs will be supported Outcome: Progressing   Problem: Respiratory: Goal: Will maintain a patent airway Outcome: Progressing Goal: Complications related to the disease process, condition or treatment will be avoided or minimized Outcome: Progressing   

## 2021-05-02 NOTE — Plan of Care (Signed)
Initiated care plan for COVID 19

## 2021-05-02 NOTE — Progress Notes (Signed)
Date and time results received: 05/02/21 0448 (use smartphrase ".now" to insert current time)  Test: Venous Blood Gas Critical Value: pCO2 82.8 (previous prior to admission 78.1)  Name of Provider Notified: M. Katherina Right [facility] Floor coverage  No New Orders Received  Actions Taken: will continue to [employer]

## 2021-05-02 NOTE — TOC Progression Note (Addendum)
Transition of Care [facility]) - Progression Note    Patient Details  Name: Jo Matthews MRN: [id number] Date of Birth: 1996-10-18  Transition of [facility]) CM/SW Contact  Geni Bers, RN Phone Number: 05/02/2021, 12:13 PM  Clinical Narrative:     Pt is active with [facility] and RN. Plans are to return home with parents. [facility] will need 24HRS notice before discharge, in order to have staff in place.   Expected Discharge Plan: Home w Home Health Services Barriers to Discharge: No Barriers Identified  Expected Discharge Plan and Services Expected Discharge Plan: Home w Home Health Services     Post Acute Care Choice: Home Health Living arrangements for the past 2 months: Single Family Home                                       Social Determinants of Health (SDOH) Interventions    Readmission Risk Interventions No flowsheet data found.

## 2021-05-02 NOTE — Progress Notes (Addendum)
Assumed care from Day RN, informed that pt arrived via [facility] around 1830.  Mother at bedside. Paged [facility] admissions to get orders for home meds.  Dr Julian Reil came to see the patient and place orders.  Pt to use home supply of Depakote (pt cannot have generic) and Cannabidol, will need to get pharmacy stickers for scanning.  Pt take Depakote at 0700, 1300 and 1900 and Cannabidol at 0700 and 1900 Keppra at 0700 and 1900  This RN attempted to get times for Keppra and Depakote adjusted to how Pt takes them at home with some success but Will need to address with day team timing of PM Keppra (from 1600  changed to 1900) Depakote ( from 2200 changed to 1900) and Cannabidol (from 1000 to 0700 and from 2200 to 1900) to get them closer to the times she takes them at home to avoid any adverse effects

## 2021-05-03 DIAGNOSIS — G8250 Quadriplegia, unspecified: Secondary | ICD-10-CM

## 2021-05-03 LAB — GLUCOSE, CAPILLARY
Glucose-Capillary: 153 mg/dL — ABNORMAL HIGH (ref 70–99)
Glucose-Capillary: 171 mg/dL — ABNORMAL HIGH (ref 70–99)
Glucose-Capillary: 179 mg/dL — ABNORMAL HIGH (ref 70–99)
Glucose-Capillary: 213 mg/dL — ABNORMAL HIGH (ref 70–99)
Glucose-Capillary: 219 mg/dL — ABNORMAL HIGH (ref 70–99)
Glucose-Capillary: 224 mg/dL — ABNORMAL HIGH (ref 70–99)
Glucose-Capillary: 233 mg/dL — ABNORMAL HIGH (ref 70–99)

## 2021-05-03 LAB — COMPREHENSIVE METABOLIC PANEL
ALT: 10 U/L (ref 0–44)
AST: 16 U/L (ref 15–41)
Albumin: 2.4 g/dL — ABNORMAL LOW (ref 3.5–5.0)
Alkaline Phosphatase: 43 U/L (ref 38–126)
Anion gap: 7 (ref 5–15)
BUN: 8 mg/dL (ref 6–20)
CO2: 39 mmol/L — ABNORMAL HIGH (ref 22–32)
Calcium: 9 mg/dL (ref 8.9–10.3)
Chloride: 97 mmol/L — ABNORMAL LOW (ref 98–111)
Creatinine, Ser: <0.3 mg/dL — ABNORMAL LOW (ref 0.44–1.00)
Glucose, Bld: 168 mg/dL — ABNORMAL HIGH (ref 70–99)
Potassium: 4.6 mmol/L (ref 3.5–5.1)
Sodium: 143 mmol/L (ref 135–145)
Total Bilirubin: 0.4 mg/dL (ref 0.3–1.2)
Total Protein: 5.6 g/dL — ABNORMAL LOW (ref 6.5–8.1)

## 2021-05-03 LAB — D-DIMER, QUANTITATIVE: D-Dimer, Quant: 0.31 ug/mL-FEU (ref 0.00–0.50)

## 2021-05-03 LAB — CBC WITH DIFFERENTIAL/PLATELET
Abs Immature Granulocytes: 0.04 10*3/uL (ref 0.00–0.07)
Basophils Absolute: 0 10*3/uL (ref 0.0–0.1)
Basophils Relative: 0 %
Eosinophils Absolute: 0 10*3/uL (ref 0.0–0.5)
Eosinophils Relative: 0 %
HCT: 33.6 % — ABNORMAL LOW (ref 36.0–46.0)
Hemoglobin: 10.5 g/dL — ABNORMAL LOW (ref 12.0–15.0)
Immature Granulocytes: 0 %
Lymphocytes Relative: 15 %
Lymphs Abs: 1.5 10*3/uL (ref 0.7–4.0)
MCH: 33.3 pg (ref 26.0–34.0)
MCHC: 31.3 g/dL (ref 30.0–36.0)
MCV: 106.7 fL — ABNORMAL HIGH (ref 80.0–100.0)
Monocytes Absolute: 0.4 10*3/uL (ref 0.1–1.0)
Monocytes Relative: 4 %
Neutro Abs: 8 10*3/uL — ABNORMAL HIGH (ref 1.7–7.7)
Neutrophils Relative %: 81 %
Platelets: 173 10*3/uL (ref 150–400)
RBC: 3.15 MIL/uL — ABNORMAL LOW (ref 3.87–5.11)
RDW: 12.3 % (ref 11.5–15.5)
WBC: 9.9 10*3/uL (ref 4.0–10.5)
nRBC: 0 % (ref 0.0–0.2)

## 2021-05-03 LAB — C-REACTIVE PROTEIN: CRP: 4 mg/dL — ABNORMAL HIGH (ref <1.0)

## 2021-05-03 LAB — FERRITIN: Ferritin: 194 ng/mL (ref 11–307)

## 2021-05-03 LAB — MAGNESIUM: Magnesium: 2.3 mg/dL (ref 1.7–2.4)

## 2021-05-03 MED ORDER — DIAZEPAM 10 MG RE GEL: 12.5000 mg | Freq: Every day | RECTAL | Status: DC | PRN

## 2021-05-03 MED ORDER — INSULIN ASPART 100 UNIT/ML IJ SOLN
0.0000 [IU] | INTRAMUSCULAR | Status: DC
  Administered 2021-05-03 (×3): 3 [IU] via SUBCUTANEOUS
  Administered 2021-05-04: 1 [IU] via SUBCUTANEOUS

## 2021-05-03 MED ORDER — CALCIUM CARBONATE ANTACID 500 MG PO CHEW: 1.0000 | CHEWABLE_TABLET | Freq: Three times a day (TID) | ORAL | Status: DC | PRN

## 2021-05-03 MED ORDER — CALCIUM CARBONATE ANTACID 1250 MG/5ML PO SUSP
200.0000 mg | Freq: Three times a day (TID) | ORAL | Status: DC | PRN
  Filled 2021-05-03: qty 5

## 2021-05-03 NOTE — Progress Notes (Signed)
Patient ID: Jo Matthews, female   DOB: [birth year], 25 y.o.   MRN: [id number]  PROGRESS NOTE    Jo Matthews  PFX:[id number] DOB: [birth year] DOA: 05/01/2021 PCP: Georgiann Hahn, MD   Brief Narrative:  25 y.o. female with medical history significant of Rett's syndrome, seizure disorder, G-tube feeding, non-verbal, chronic hypercapnic and hypoxic respiratory failure on 0.5-1L O2 at baseline at home (has Trelegy at home per mother but not used in recent months since she develops seizure any time they use it) presented with worsening shortness of breath and hypoxia with low-grade fevers.  On presentation, she was initially started on IV antibiotics for possible sepsis and bacterial pneumonia.  Subsequently, she was tested positive for COVID-19 and started on remdesivir and steroids.  Assessment & Plan:    Acute on chronic hypoxic respiratory failure COVID-19 pneumonia -Chest x-ray on presentation showed left lower lobe airspace consolidation versus atelectasis.  Tested positive for COVID-19.  Procalcitonin less than 0.1 on presentation. Dc'd antibiotics on 05/02/2021.  Continue remdesivir and steroids for now -COVID-19 Labs  Recent Labs    05/02/21 0422 05/03/21 0425  DDIMER 0.37 0.31  FERRITIN  --  194  CRP 13.9* 4.0*     Lab Results  Component Value Date   SARSCOV2NAA POSITIVE (A) 05/01/2021   -Follow daily inflammatory markers: Improving -Required 5 L oxygen on presentation.  Normally uses 0.5 to 1 L of oxygen at home.  Currently on 2 L oxygen. -No temperature spikes overnight 24 hours.  Sepsis: Present on admission Leukocytosis -Presented with fever, tachypnea, tachycardia, leukocytosis with possible pneumonia on x-ray.  Possibly from COVID-19 pneumonia.  Hemodynamically improving. -Leukocytosis is resolved.  Tube feedings have been started.  IV fluids have been discontinued.  History of cerebral palsy/Rett syndrome/spastic quadriplegia/dysphagia status post G-tube  feeding -Dietary following for G-tube feedings. -Oral feedings has also been started with  Seizure disorder -Continue home antiseizure medications.  Mother states that patient normally has multiple episodes of seizures at home every day.  DVT prophylaxis: Lovenox Code Status: Full Family Communication: Mother at bedside Disposition Plan: Status is: Inpatient  Remains inpatient appropriate because:Inpatient level of care appropriate due to severity of illness  Dispo: The patient is from: Home              Anticipated d/c is to: Home in 1 to 2 days if remains stable              Patient currently is not medically stable to d/c.   Difficult to place patient No  Consultants: None  Procedures: None  Antimicrobials: Rocephin and Zithromax from 05/01/2021 -05/02/2021   Subjective: Patient seen and examined at bedside.  Nonverbal and does not respond to questions.  No overnight fever, or vomiting reported by nursing staff.  Had seizure earlier this morning as per mother at bedside. Objective: Vitals:   05/02/21 0654 05/02/21 1205 05/02/21 2102 05/03/21 0628  BP: 108/65 102/61 101/63 117/88  Pulse: (!) 105 95 100 89  Resp: 18 16 16 20   Temp: 97.7 F (36.5 C) 97.8 F (36.6 C) 97.9 F (36.6 C) 97.8 F (36.6 C)  TempSrc: Oral Axillary Oral Oral  SpO2: 100% 100% 96%   Weight:      Height:        Intake/Output Summary (Last 24 hours) at 05/03/2021 0804 Last data filed at 05/03/2021 0630 Gross per 24 hour  Intake 220 ml  Output 700 ml  Net -480 ml    Filed Weights   05/01/21  0815 05/02/21 0000  Weight: 42.2 kg 42.2 kg    Examination:  General exam: No acute distress.  Still on 2 L oxygen via nasal cannula.  Looks chronically ill.  Older than stated age.   Respiratory system: Decreased breath sounds at bases bilaterally with some crackles  cardiovascular system: Rate controlled, S1-S2 heard gastrointestinal system: Abdomen is distended slightly, soft and nontender.  Bowel  sounds are heard.  G-tube present. Extremities: Trace lower extremity edema; no clubbing Central nervous system: Does not follow commands.  Nonverbal, spastic quadriplegia present.  Atrophic limbs present.  Poor historian. Skin: No obvious petechiae/rashes  Psychiatry: Could not be assessed because of mental status  Data Reviewed: I have personally reviewed following labs and imaging studies  CBC: Recent Labs  Lab 05/01/21 0827 05/01/21 0920 05/02/21 0422 05/03/21 0623  WBC 6.1  --  13.7* 9.9  NEUTROABS 4.3  --  12.9* 8.0*  HGB 12.0 11.9* 9.3* 10.5*  HCT 36.8 35.0* 30.5* 33.6*  MCV 102.5*  --  107.0* 106.7*  PLT 146*  --  160 173    Basic Metabolic Panel: Recent Labs  Lab 05/01/21 0827 05/01/21 0920 05/02/21 0422 05/03/21 0425  NA 138 134* 141 143  K 4.0 3.9 4.0 4.6  CL 89*  --  96* 97*  CO2 43*  --  38* 39*  GLUCOSE 100*  --  154* 168*  BUN 5*  --  5* 8  CREATININE <0.30*  --  <0.30* <0.30*  CALCIUM 9.9  --  8.2* 9.0  MG  --   --   --  2.3    GFR: CrCl cannot be calculated (This lab value cannot be used to calculate CrCl because it is not a number: <0.30). Liver Function Tests: Recent Labs  Lab 05/02/21 0422 05/03/21 0425  AST 14* 16  ALT 10 10  ALKPHOS 37* 43  BILITOT 0.3 0.4  PROT 5.9* 5.6*  ALBUMIN 2.7* 2.4*    No results for input(s): LIPASE, AMYLASE in the last 168 hours. No results for input(s): AMMONIA in the last 168 hours. Coagulation Profile: No results for input(s): INR, PROTIME in the last 168 hours. Cardiac Enzymes: No results for input(s): CKTOTAL, CKMB, CKMBINDEX, TROPONINI in the last 168 hours. BNP (last 3 results) No results for input(s): PROBNP in the last 8760 hours. HbA1C: No results for input(s): HGBA1C in the last 72 hours. CBG: Recent Labs  Lab 05/02/21 2058 05/03/21 0017 05/03/21 0536  GLUCAP 159* 153* 171*   Lipid Profile: No results for input(s): CHOL, HDL, LDLCALC, TRIG, CHOLHDL, LDLDIRECT in the last 72  hours. Thyroid Function Tests: No results for input(s): TSH, T4TOTAL, FREET4, T3FREE, THYROIDAB in the last 72 hours. Anemia Panel: Recent Labs    05/03/21 0425  FERRITIN 194   Sepsis Labs: Recent Labs  Lab 05/01/21 1156 05/01/21 2000 05/01/21 2019  PROCALCITON  --  <0.10  --   LATICACIDVEN 1.6  --  1.5     Recent Results (from the past 240 hour(s))  Blood culture (routine x 2)     Status: None (Preliminary result)   Collection Time: 05/01/21  8:27 AM   Specimen: BLOOD RIGHT ARM  Result Value Ref Range Status   Specimen Description   Final    BLOOD RIGHT ARM Performed at [facility], [address] [place], [place] [address]    Special Requests   Final    Blood Culture adequate volume Performed at [facility], [address]  [place], [place] [address]    Culture   Final    NO GROWTH 2 DAYS Performed at [facility], [address] [place], [place] [address]    Report Status PENDING  Incomplete  Resp Panel by RT-PCR (Flu A&B, Covid) Nasopharyngeal Swab     Status: Abnormal   Collection Time: 05/01/21  8:28 AM   Specimen: Nasopharyngeal Swab; Nasopharyngeal(NP) swabs in vial transport medium  Result Value Ref Range Status   SARS Coronavirus 2 by RT PCR POSITIVE (A) NEGATIVE Final    Comment: RESULT CALLED TO, READ BACK BY AND VERIFIED WITH: CT Electa Sniff @ 5681. 05/01/21. KLJ. (NOTE) SARS-CoV-2 target nucleic acids are DETECTED.  The SARS-CoV-2 RNA is generally detectable in upper respiratory specimens during the acute phase of infection. Positive results are indicative of the presence of the identified virus, but do not rule out bacterial infection or co-infection with other pathogens not detected by the test. Clinical correlation with patient history and other diagnostic information is necessary to determine patient infection status. The expected result is Negative.  Fact Sheet for  Patients: BloggerCourse.com  Fact Sheet for Healthcare Providers: SeriousBroker.it  This test is not yet approved or cleared by the [place] FDA and  has been authorized for detection and/or diagnosis of SARS-CoV-2 by FDA under an Emergency Use Authorization (EUA).  This EUA will remain in effect (meaning this test can  be used) for the duration of  the COVID-19 declaration under Section 564(b)(1) of the Act, 21 U.S.C. section 360bbb-3(b)(1), unless the authorization is terminated or revoked sooner.     Influenza A by PCR NEGATIVE NEGATIVE Final   Influenza B by PCR NEGATIVE NEGATIVE Final    Comment: (NOTE) The Xpert Xpress SARS-CoV-2/FLU/RSV plus assay is intended as an aid in the diagnosis of influenza from Nasopharyngeal swab specimens and should not be used as a sole basis for treatment. Nasal washings and aspirates are unacceptable for Xpert Xpress SARS-CoV-2/FLU/RSV testing.  Fact Sheet for Patients: BloggerCourse.com  Fact Sheet for Healthcare Providers: SeriousBroker.it  This test is not yet approved or cleared by the [place] FDA and has been authorized for detection and/or diagnosis of SARS-CoV-2 by FDA under an Emergency Use Authorization (EUA). This EUA will remain in effect (meaning this test can be used) for the duration of the COVID-19 declaration under Section 564(b)(1) of the Act, 21 U.S.C. section 360bbb-3(b)(1), unless the authorization is terminated or revoked.  Performed at [facility], [address] [place], [place] [address]           Radiology Studies: DG Chest Portable 1 View  Result Date: 05/01/2021 CLINICAL DATA:  Cough and shortness breath. EXAM: PORTABLE CHEST 1 VIEW COMPARISON:  Chest CT August 05, 2017 FINDINGS: The cardiac silhouette and left hemidiaphragm are obscured. Streaky peribronchial airspace  opacities in the right lower lung. No acute osseous abnormality.  Spinal fusion rods as before. IMPRESSION: Left lower lobe airspace consolidation versus atelectasis. Left pleural effusion cannot be excluded. Streaky peribronchial opacities in the right lower lobe. Electronically Signed   By: Ted Mcalpine M.D.   On: 05/01/2021 09:01        Scheduled Meds:  cannabidiol  350 mg Per Tube BID   cetirizine HCl  10 mg Per Tube Daily   clonazepam  0.25 mg Oral Daily   And   clonazepam  0.5 mg Oral QHS   divalproex  375 mg Oral 2 times per day   And   divalproex  500 mg Oral QHS   enoxaparin (LOVENOX) injection  30 mg Subcutaneous Q24H   feeding supplement (OSMOLITE 1.5 CAL)  237 mL Per Tube QID   feeding supplement (PROSource TF)  45 mL Per Tube Daily   free water  180 mL Per Tube QID   levETIRAcetam  2,000 mg Per Tube QAC breakfast   And   levETIRAcetam  2,250 mg Oral q1600   methylPREDNISolone (SOLU-MEDROL) injection  0.5 mg/kg Intravenous BID   Followed by   Melene Muller[START ON 05/04/2021] predniSONE  50 mg Oral Daily   Continuous Infusions:  sodium chloride Stopped (05/01/21 1417)   remdesivir 100 mg in NS 100 mL 100 mg (05/02/21 0840)          Glade LloydKshitiz Darold Miley, MD [facility] 05/03/2021, 8:04 AM

## 2021-05-03 NOTE — Plan of Care (Signed)
  Problem: Clinical Measurements: Goal: Diagnostic test results will improve Outcome: Progressing Goal: Respiratory complications will improve Outcome: Progressing   Problem: Safety: Goal: Ability to remain free from injury will improve Outcome: Progressing   

## 2021-05-03 NOTE — Progress Notes (Signed)
NUTRITION NOTE  Secure chat message received from SLP that mom is concerned about patient possibly experiencing reflux and suggestions of ways to minimize with TF.  Able to talk with mom. Reviewed conversation from 6/16 pertaining to chronic cough and position when receiving TF boluses.   Mom confirms patient has a chronic cough and that it has not changed (non-productive, not wet-sounding, no change in frequency of occurrence). She confirms that patient is sitting upright when receiving TF boluses; either sitting in wheelchair or in home hospital bed with HOB up the entire way.   Patient has never been noted to cough when consuming PO items. Chronic cough happens at random when patient is relaxing or occurs during or shortly after bolus TF administration. Confirmed that TF boluses are administered via gravity and not via syringe.   Mom reports that she herself experiences severe reflux and is concerned that patient is experiencing the same. She has not tried anything at home, such as antacid, d/t concern of medication interaction.   Discussed trial of antacid vs trial of continuous TF for a certain number of hours/day rather than bolus TF. Mom indicates preference for trial of antacid. She reports having TF pump at home should antacid not help and there is a need to transition to continuous TF after d/c home. Mom reports possible d/c home tomorrow.       Trenton Gammon, MS, RD, LDN, CNSC Inpatient Clinical Dietitian RD pager # available in [facility]  After hours/weekend pager # available in [facility]

## 2021-05-03 NOTE — Plan of Care (Signed)
  Problem: Education: Goal: Knowledge of risk factors and measures for prevention of condition will improve Outcome: Progressing   Problem: Coping: Goal: Psychosocial and spiritual needs will be supported Outcome: Progressing   Problem: Respiratory: Goal: Will maintain a patent airway Outcome: Progressing Goal: Complications related to the disease process, condition or treatment will be avoided or minimized Outcome: Progressing   Problem: Clinical Measurements: Goal: Diagnostic test results will improve Outcome: Progressing Goal: Respiratory complications will improve Outcome: Progressing   Problem: Safety: Goal: Ability to remain free from injury will improve Outcome: Progressing

## 2021-05-03 NOTE — Evaluation (Signed)
Clinical/Bedside Swallow Evaluation Patient Details  Name: Jo Matthews MRN: [id number] Date of Birth: [birth year]  Today's Date: 05/03/2021 Time: SLP Start Time (ACUTE ONLY): 1059 SLP Stop Time (ACUTE ONLY): 1140 SLP Time Calculation (min) (ACUTE ONLY): 41 min  Past Medical History:  Past Medical History:  Diagnosis Date   Fracture, humerus closed    G tube feedings (HCC)    Pneumonia    Recurrent fever of unknown cause 08/31/2012   Fever to 104-105 lasting 24 hrs, occuring once a week for the past 5 weeks (onset Sept 2013)   Rett's syndrome    Scoliosis    Seizure disorder (HCC)    Seizures (HCC)    Weight loss    Past Surgical History:  Past Surgical History:  Procedure Laterality Date   BACK SURGERY     GASTROSTOMY TUBE PLACEMENT     SPINAL GROWTH RODS     SPINE SURGERY N/A    [employer] 02/14/2021   HPI:  25 yo female with h/o Rett's syndrome, CP adm to [facility] Rett's syndrome, seizure disorder, PEG for TF, non-verbal, and 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 She presented to the ED due to worsening SOB and hypoxia with low-grade fevers. At the time of presentation she was started on IV abx d/t possible sepsis and bacterial PNA. In the ED she was found to be COVID-19 positive.  Pt has h/o dysphagia - with variable intake.  Mom reports she used to enjoy eating but now they have to encourage her to eat.  She underwent MBS at [facility] in 2019 when admitted for pna - and MBS reportedly was normal *could not locate SLP notes/results.  Pt's mom states pt takes medications crushed via PEG, conducts water flushes and gives pt tube feeding *modified based on pt's po intake.  Mother mentions concerns for potential reflux.  Mom also reports pt has had decreased left lung function since her rods were placed in her back. Pt does not consistently allow  oral care/dental brushing per mother.  When she sees the dentist, she must be sedated.   Swallow eval ordered.   Assessment / Plan / Recommendation Clinical Impression  Pt's mother present with pt during session and was observed feeding her.  Pt was slid down in bed due to slick sheets and thus SLP repositioned her to reverse trendelenburg for optimal position.  No indication of aspiraiton with thin liquid via straw- swallow appeared timely by clinical observation of laryngeal elevation.  She also consumed dry cheerios - with suspected delay in oral transit, premature spillage of bolus into pharynx - as strong coughing episode noted immediately x1 after approximately 8 cheerios consumed.  Advised mom to provide more cohesive and moist foods to mitigate aspiration risk in lieu of dry crumbly foods.  Mom feeds pt uses caution - waiting until pt opens her mouth to accept more intake.  While pt acutely ill, advised she consume water and pureed foods that are easier to manage.  Mom reports pt with chronic cough during the day - and is having diffiuclty differentiating if is related to aspiration of food/drink, reflux or just dry cough.  Advised follow up with dietician to discuss options if reflux is a concern.  Reviewed option to conduct MBS during this hospital stay OR use compensation/aspiration mitigation strategies and monitor pt closely documenting when coughing and  if consuming po or getting TF at that time.   Mom reports pt has maintained nutrition without pnas at this time and thus desires to continue po with suggested modifications and close monitoring.  She will follow up with PCP if MBS is needed in the future.  All education completed with teach back. Thanks for this consult. SLP Visit Diagnosis: Dysphagia, unspecified (R13.10);Dysphagia, oropharyngeal phase (R13.12)    Aspiration Risk  Mild aspiration risk    Diet Recommendation Thin liquid;Dysphagia 3 (Mech soft) (modify if pt acutely ill)    Liquid Administration via: Straw Medication Administration: Via alternative means Supervision: Full supervision/cueing for compensatory strategies (mom to supervise) Postural Changes: Seated upright at 90 degrees;Remain upright for at least 30 minutes after po intake;Other (Comment) (reverse trendelenburg)    Other  Recommendations Oral Care Recommendations: Oral care BID   Follow up Recommendations None      Frequency and Duration   N/a         Prognosis   N/a     Swallow Study   General Date of Onset: 05/03/21 HPI: 25 yo female with h/o Rett's syndrome, CP adm to [facility] Rett's syndrome, seizure disorder, PEG for TF, non-verbal, and 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 She presented to the ED due to worsening SOB and hypoxia with low-grade fevers. At the time of presentation she was started on IV abx d/t possible sepsis and bacterial PNA. In the ED she was found to be COVID-19 positive.  Pt has h/o dysphagia - with variable intake.  Mom reports she used to enjoy eating but now they have to encourage her to eat.  She underwent MBS at [facility] in 2019 when admitted for pna - and MBS reportedly was normal *could not locate SLP notes/results.  Pt's mom states pt takes medications crushed via PEG, conducts water flushes and gives pt tube feeding *modified based on pt's po intake.  Mother mentions concerns for potential reflux.  Mom also reports pt has had decreased left lung function since her rods were placed in her back. Pt does not consistently allow oral care/dental brushing per mother.  When she sees the dentist, she must be sedated.   Swallow eval ordered. Type of Study: Bedside Swallow Evaluation Previous Swallow Assessment: MBS 2007 via cone *no results available; MBS 2019 [facility]- radiologist report says normal swallow Diet Prior to this  Study: Regular;Thin liquids Temperature Spikes Noted: No Respiratory Status: Nasal cannula History of Recent Intubation: No Behavior/Cognition: Alert;Cooperative;Pleasant mood Oral Cavity Assessment: Other (comment) (limited view- but appears clear with minimal opening pt conducted) Oral Care Completed by SLP: No Oral Cavity - Dentition: Adequate natural dentition Vision: Impaired for self-feeding Self-Feeding Abilities: Total assist;Other (Comment) (mom feeds pt) Patient Positioning: Partially reclined (used reverse trendelenburg for optimal position) Baseline Vocal Quality: Other (comment) Volitional Cough: Cognitively unable to elicit Volitional Swallow: Unable to elicit    Oral/Motor/Sensory Function Overall Oral Motor/Sensory Function: Other (comment) (pt did not follow directions, able to seal lips on cheerio and straw, weak reflexive cough and no volitional cough)   Ice Chips Ice chips: Not tested   Thin Liquid Thin Liquid: Within functional limits Presentation: Straw    Nectar Thick Nectar Thick Liquid: Not tested   Honey Thick Honey Thick Liquid: Not tested   Puree Puree: Not tested   Solid  Solid: Impaired Oral Phase Impairments: Other (comment) Oral Phase Functional Implications: Other (comment) Pharyngeal Phase Impairments: Cough - Delayed Other Comments: Suspect pt with decreased mastication ability --with potential premature spillage of dry cheerio into pharynx/larynx - advised mom to moisten with milk and provide pt with more cohesive foods if decreases cough with intake      Chales Abrahams 05/03/2021,12:31 PM  Rolena Infante, MS [facility] SLP Acute Rehab Services Office [phone] Pager [phone]

## 2021-05-04 LAB — GLUCOSE, CAPILLARY
Glucose-Capillary: 144 mg/dL — ABNORMAL HIGH (ref 70–99)
Glucose-Capillary: 80 mg/dL (ref 70–99)
Glucose-Capillary: 290 mg/dL — ABNORMAL HIGH (ref 70–99)

## 2021-05-04 MED ORDER — CETIRIZINE HCL 1 MG/ML PO SOLN: 10.0000 mg | Freq: Every day | ORAL | Status: DC

## 2021-05-04 MED ORDER — POLYETHYLENE GLYCOL 3350 17 GM/SCOOP PO POWD: 1.0000 | Freq: Every day | ORAL | Status: DC | PRN

## 2021-05-04 MED ORDER — CALCIUM CARBONATE ANTACID 1250 MG/5ML PO SUSP: 200.0000 mg | Freq: Three times a day (TID) | ORAL | 0 refills | Status: DC | PRN

## 2021-05-04 MED ORDER — PREDNISONE 5 MG/5ML PO SOLN: 30.0000 mg | Freq: Every day | ORAL | 0 refills | Status: AC

## 2021-05-04 NOTE — Plan of Care (Signed)
  Problem: Respiratory: Goal: Will maintain a patent airway Outcome: Progressing   Problem: Clinical Measurements: Goal: Diagnostic test results will improve Outcome: Progressing Goal: Respiratory complications will improve Outcome: Progressing   Problem: Nutrition: Goal: Adequate nutrition will be maintained Outcome: Progressing   Problem: Safety: Goal: Ability to remain free from injury will improve Outcome: Progressing

## 2021-05-04 NOTE — Plan of Care (Signed)
  Problem: Education: Goal: Knowledge of risk factors and measures for prevention of condition will improve Outcome: Progressing   Problem: Respiratory: Goal: Will maintain a patent airway Outcome: Progressing Goal: Complications related to the disease process, condition or treatment will be avoided or minimized Outcome: Progressing   Problem: Safety: Goal: Ability to remain free from injury will improve Outcome: Progressing   

## 2021-05-04 NOTE — Discharge Planning (Signed)
Reviewed discharge paperwork with patient's mother Norwood Levo. All questions answered. Obtained home medications from the pharmacy and returned to the patient's mother. IV removed.

## 2021-05-04 NOTE — Discharge Summary (Signed)
Physician Discharge Summary  Jo Matthews ZOX:[id number] DOB: 1996-01-06 DOA: 05/01/2021  PCP: Georgiann Hahn, MD  Admit date: 05/01/2021 Discharge date: 05/04/2021  Admitted From: Home Disposition: Home  Recommendations for Outpatient Follow-up:  Follow up with PCP in 1 week  Follow up in ED if symptoms worsen or new appear   Home Health: No Equipment/Devices: None  Discharge Condition: Stable CODE STATUS: Full Diet recommendation: Heart healthy  Brief/Interim Summary: 25 y.o. female with medical history significant of Rett's syndrome, seizure disorder, G-tube feeding, non-verbal, chronic hypercapnic and hypoxic respiratory failure on 0.5-1L O2 at baseline at home (has Trelegy at home per mother but not used in recent months since she develops seizure any time they use it) presented with worsening shortness of breath and hypoxia with low-grade fevers.  On presentation, she was initially started on IV antibiotics for possible sepsis and bacterial pneumonia.  Subsequently, she was tested positive for COVID-19 and started on remdesivir and steroids. During hospitalization, her condition has improved.  She is currently hemodynamically stable and afebrile.  Tolerating tube feedings.  Oxygen requirement is back to baseline.  She will be discharged home on prednisone via tube for 7 days.  Discharge Diagnoses:   Acute on chronic hypoxic respiratory failure COVID-19 pneumonia -Chest x-ray on presentation showed left lower lobe airspace consolidation versus atelectasis.  Tested positive for COVID-19.  Procalcitonin less than 0.1 on presentation. Dc'd antibiotics on 05/02/2021.  Currently on remdesivir and steroids for now COVID-19 Labs  Recent Labs    05/02/21 0422 05/03/21 0425  DDIMER 0.37 0.31  FERRITIN  --  194  CRP 13.9* 4.0*    Lab Results  Component Value Date   SARSCOV2NAA POSITIVE (A) 05/01/2021    -Daily inflammatory markers were improving till 05/03/2021. -Required 5 L  oxygen on presentation.  Normally uses 0.5 to 1 L of oxygen at home.  Currently on 1 L oxygen. -She is currently hemodynamically stable and afebrile.  Tolerating tube feedings.  Oxygen requirement is back to baseline.  She will be discharged home on prednisone via tube for 7 days.  Outpatient follow-up.   Sepsis: Present on admission.  Sepsis is resolved. Leukocytosis -Presented with fever, tachypnea, tachycardia, leukocytosis with possible pneumonia on x-ray.  Possibly from COVID-19 pneumonia.  Hemodynamically improving. -Leukocytosis is resolved.  Tube feedings have been started.  IV fluids have been discontinued.   History of cerebral palsy/Rett syndrome/spastic quadriplegia/dysphagia status post G-tube feeding -Tolerating G-tube feedings.  Continue on discharge. -Oral feedings has also been started: Although oral intake is very poor.   Seizure disorder -Continue home antiseizure medications.  Mother states that patient normally has multiple episodes of seizures at home every day.   Discharge Instructions  Discharge Instructions     Diet - low sodium heart healthy   Complete by: As directed    Increase activity slowly   Complete by: As directed       Allergies as of 05/04/2021       Reactions   Cephalexin Other (See Comments)   seizure   Onfi [clobazam]    Severe side effects   Other    Penicillins Shortness Of Breath        Medication List     STOP taking these medications    loratadine 5 MG/5ML syrup Commonly known as: Claritin   Multivital Chew       TAKE these medications    acetaminophen 160 MG/5ML liquid Commonly known as: TYLENOL Place 480 mg into feeding tube every 4 (  four) hours as needed for fever.   calcium carbonate (dosed in mg elemental calcium) 1250 MG/5ML Susp Place 2 mLs (200 mg of elemental calcium total) into feeding tube 3 (three) times daily as needed for indigestion.   cetirizine HCl 1 MG/ML solution Commonly known as:  ZYRTEC Place 10 mLs (10 mg total) into feeding tube daily.   diazepam 10 MG Gel Commonly known as: DIASTAT ACUDIAL Place 12.5 mg rectally daily as needed for seizure (seizure lasting more than 5 minutes).   divalproex 125 MG capsule Commonly known as: DEPAKOTE SPRINKLE Take 375-500 mg by mouth See admin instructions. Per tube. Takes 3 capsules in the morning and afternoon and 4 capsules at night   Epidiolex 100 MG/ML solution Generic drug: cannabidiol Place 350 mg into feeding tube in the morning and at bedtime.   ibuprofen 100 MG/5ML suspension Commonly known as: ADVIL Place 300 mg into feeding tube every 4 (four) hours as needed for mild pain.   levETIRAcetam 100 MG/ML solution Commonly known as: KEPPRA Place 2,000-2,250 mg into feeding tube See admin instructions. Takes 20 ml in the morning and 22.5 ml in the afternoon   mupirocin cream 2 % Commonly known as: BACTROBAN Apply 1 application topically daily as needed (irritation).   polyethylene glycol powder 17 GM/SCOOP powder Commonly known as: GLYCOLAX/MIRALAX Place 255 g into feeding tube daily as needed for mild constipation. What changed: See the new instructions.   predniSONE 5 MG/5ML solution Take 30 mLs (30 mg total) by mouth daily with breakfast for 7 days.   ROBITUSSIN CHEST CONGESTION PO Place 10 mLs into feeding tube daily as needed (cough).   sodium chloride 0.9 % nebulizer solution Inhale 3 mLs into the lungs daily as needed for wheezing.       ASK your doctor about these medications    clonazePAM 0.25 MG disintegrating tablet Commonly known as: KLONOPIN DISSOLVE 1 TABLET IN MOUTH IN THE AM, AND 2 TAB AT NIGHT. ALSO CAN TAKE ONE TAB AS NEEDED FOR BREAKTHROUGH SEIZURES.        Follow-up Information     Georgiann Hahn, MD. Schedule an appointment as soon as possible for a visit in 1 week(s).   Specialty: Pediatrics Contact information: [address] [place]  [address] [phone]                Allergies  Allergen Reactions   Cephalexin Other (See Comments)    seizure   Onfi [Clobazam]     Severe side effects   Other    Penicillins Shortness Of Breath    Consultations: None   Procedures/Studies: DG Chest Portable 1 View  Result Date: 05/01/2021 CLINICAL DATA:  Cough and shortness breath. EXAM: PORTABLE CHEST 1 VIEW COMPARISON:  Chest CT August 05, 2017 FINDINGS: The cardiac silhouette and left hemidiaphragm are obscured. Streaky peribronchial airspace opacities in the right lower lung. No acute osseous abnormality.  Spinal fusion rods as before. IMPRESSION: Left lower lobe airspace consolidation versus atelectasis. Left pleural effusion cannot be excluded. Streaky peribronchial opacities in the right lower lobe. Electronically Signed   By: Ted Mcalpine M.D.   On: 05/01/2021 09:01      Subjective: Patient seen and examined at bedside.  Parents present at bedside.  No overnight fever or vomiting reported.  Tolerating tube feeding diet.  Parents are okay for patient to come home today.  Discharge Exam: Vitals:   05/03/21 2211 05/04/21 0451  BP: 129/85 106/89  Pulse: 93 81  Resp: 18 18  Temp: 97.8 F (36.6 C) 97.8 F (36.6 C)  SpO2: 97% 98%    General: Looks chronically ill.  Older than stated age.  Nonverbal.  On 1 L oxygen via nasal cannula.  Nonverbal, spastic quadriplegia present.  Atrophic limbs present. Cardiovascular: rate controlled, S1/S2 + Respiratory: bilateral decreased breath sounds at bases with some scattered crackles Abdominal: Soft, NT, ND, bowel sounds +; G-tube present Extremities: no edema, no cyanosis    The results of significant diagnostics from this hospitalization (including imaging, microbiology, ancillary and laboratory) are listed below for reference.     Microbiology: Recent Results (from the past 240 hour(s))  Blood culture (routine x 2)     Status: None (Preliminary result)    Collection Time: 05/01/21  8:27 AM   Specimen: BLOOD RIGHT ARM  Result Value Ref Range Status   Specimen Description   Final    BLOOD RIGHT ARM Performed at [facility], [address] [place], [place] [address]    Special Requests   Final    Blood Culture adequate volume Performed at [facility], [address] [place], [place] [address]    Culture   Final    NO GROWTH 2 DAYS Performed at [facility], [address] [place], [place] [address]    Report Status PENDING  Incomplete  Resp Panel by RT-PCR (Flu A&B, Covid) Nasopharyngeal Swab     Status: Abnormal   Collection Time: 05/01/21  8:28 AM   Specimen: Nasopharyngeal Swab; Nasopharyngeal(NP) swabs in vial transport medium  Result Value Ref Range Status   SARS Coronavirus 2 by RT PCR POSITIVE (A) NEGATIVE Final    Comment: RESULT CALLED TO, READ BACK BY AND VERIFIED WITH: CT Electa Sniff @ 2130. 05/01/21. KLJ. (NOTE) SARS-CoV-2 target nucleic acids are DETECTED.  The SARS-CoV-2 RNA is generally detectable in upper respiratory specimens during the acute phase of infection. Positive results are indicative of the presence of the identified virus, but do not rule out bacterial infection or co-infection with other pathogens not detected by the test. Clinical correlation with patient history and other diagnostic information is necessary to determine patient infection status. The expected result is Negative.  Fact Sheet for Patients: BloggerCourse.com  Fact Sheet for Healthcare Providers: SeriousBroker.it  This test is not yet approved or cleared by the [place] FDA and  has been authorized for detection and/or diagnosis of SARS-CoV-2 by FDA under an Emergency Use Authorization (EUA).  This EUA will remain in effect (meaning this test can  be used) for the duration of  the COVID-19 declaration under Section 564(b)(1) of the  Act, 21 U.S.C. section 360bbb-3(b)(1), unless the authorization is terminated or revoked sooner.     Influenza A by PCR NEGATIVE NEGATIVE Final   Influenza B by PCR NEGATIVE NEGATIVE Final    Comment: (NOTE) The Xpert Xpress SARS-CoV-2/FLU/RSV plus assay is intended as an aid in the diagnosis of influenza from Nasopharyngeal swab specimens and should not be used as a sole basis for treatment. Nasal washings and aspirates are unacceptable for Xpert Xpress SARS-CoV-2/FLU/RSV testing.  Fact Sheet for Patients: BloggerCourse.com  Fact Sheet for Healthcare Providers: SeriousBroker.it  This test is not yet approved or cleared by the [place] FDA and has been authorized for detection and/or diagnosis of SARS-CoV-2 by FDA under an Emergency Use Authorization (EUA). This EUA will remain in effect (meaning this test can be used) for the duration of the COVID-19 declaration under Section  564(b)(1) of the Act, 21 U.S.C. section 360bbb-3(b)(1), unless the authorization is terminated or revoked.  Performed at [facility], [address], [place], [place] [address]   Culture, blood (Routine X 2) w Reflex to ID Panel     Status: None (Preliminary result)   Collection Time: 05/01/21  8:19 PM   Specimen: BLOOD  Result Value Ref Range Status   Specimen Description   Final    BLOOD LEFT HAND Performed at [facility], [address] [place], [place] [address]    Special Requests   Final    BOTTLES DRAWN AEROBIC ONLY Blood Culture adequate volume Performed at [facility], [address] [place], [place] [address]    Culture   Final    NO GROWTH 2 DAYS Performed at [facility], [address] [place], [place] [address]    Report Status PENDING  Incomplete     Labs: BNP (last 3 results) No results for input(s): BNP in the last 8760 hours. Basic Metabolic  Panel: Recent Labs  Lab 05/01/21 0827 05/01/21 0920 05/02/21 0422 05/03/21 0425  NA 138 134* 141 143  K 4.0 3.9 4.0 4.6  CL 89*  --  96* 97*  CO2 43*  --  38* 39*  GLUCOSE 100*  --  154* 168*  BUN 5*  --  5* 8  CREATININE <0.30*  --  <0.30* <0.30*  CALCIUM 9.9  --  8.2* 9.0  MG  --   --   --  2.3   Liver Function Tests: Recent Labs  Lab 05/02/21 0422 05/03/21 0425  AST 14* 16  ALT 10 10  ALKPHOS 37* 43  BILITOT 0.3 0.4  PROT 5.9* 5.6*  ALBUMIN 2.7* 2.4*   No results for input(s): LIPASE, AMYLASE in the last 168 hours. No results for input(s): AMMONIA in the last 168 hours. CBC: Recent Labs  Lab 05/01/21 0827 05/01/21 0920 05/02/21 0422 05/03/21 0623  WBC 6.1  --  13.7* 9.9  NEUTROABS 4.3  --  12.9* 8.0*  HGB 12.0 11.9* 9.3* 10.5*  HCT 36.8 35.0* 30.5* 33.6*  MCV 102.5*  --  107.0* 106.7*  PLT 146*  --  160 173   Cardiac Enzymes: No results for input(s): CKTOTAL, CKMB, CKMBINDEX, TROPONINI in the last 168 hours. BNP: Invalid input(s): POCBNP CBG: Recent Labs  Lab 05/03/21 1558 05/03/21 2034 05/03/21 2349 05/04/21 0436 05/04/21 0820  GLUCAP 219* 224* 233* 144* 80   D-Dimer Recent Labs    05/02/21 0422 05/03/21 0425  DDIMER 0.37 0.31   Hgb A1c No results for input(s): HGBA1C in the last 72 hours. Lipid Profile No results for input(s): CHOL, HDL, LDLCALC, TRIG, CHOLHDL, LDLDIRECT in the last 72 hours. Thyroid function studies No results for input(s): TSH, T4TOTAL, T3FREE, THYROIDAB in the last 72 hours.  Invalid input(s): FREET3 Anemia work up Recent Labs    05/03/21 0425  FERRITIN 194   Urinalysis    Component Value Date/Time   COLORURINE YELLOW 12/05/2014 0017   APPEARANCEUR CLEAR 12/05/2014 0017   LABSPEC 1.019 12/05/2014 0017   PHURINE 6.5 12/05/2014 0017   GLUCOSEU 500 (A) 12/05/2014 0017   HGBUR NEGATIVE 12/05/2014 0017   BILIRUBINUR NEGATIVE 12/05/2014 0017   BILIRUBINUR neg 07/27/2013 1517   KETONESUR 15 (A) 12/05/2014  0017   PROTEINUR NEGATIVE 12/05/2014 0017   UROBILINOGEN 0.2 12/05/2014 0017   NITRITE NEGATIVE 12/05/2014 0017   LEUKOCYTESUR NEGATIVE 12/05/2014 0017   Sepsis Labs  Invalid input(s): PROCALCITONIN,  WBC,  LACTICIDVEN Microbiology Recent Results (from the past 240 hour(s))  Blood culture (routine x 2)     Status: None (Preliminary result)   Collection Time: 05/01/21  8:27 AM   Specimen: BLOOD RIGHT ARM  Result Value Ref Range Status   Specimen Description   Final    BLOOD RIGHT ARM Performed at [facility], [address] [place], [place] [address]    Special Requests   Final    Blood Culture adequate volume Performed at [facility], [address] [place], [place] [address]    Culture   Final    NO GROWTH 2 DAYS Performed at [facility], [address] [place], [place] [address]    Report Status PENDING  Incomplete  Resp Panel by RT-PCR (Flu A&B, Covid) Nasopharyngeal Swab     Status: Abnormal   Collection Time: 05/01/21  8:28 AM   Specimen: Nasopharyngeal Swab; Nasopharyngeal(NP) swabs in vial transport medium  Result Value Ref Range Status   SARS Coronavirus 2 by RT PCR POSITIVE (A) NEGATIVE Final    Comment: RESULT CALLED TO, READ BACK BY AND VERIFIED WITH: CT Electa Sniff @ 1017. 05/01/21. KLJ. (NOTE) SARS-CoV-2 target nucleic acids are DETECTED.  The SARS-CoV-2 RNA is generally detectable in upper respiratory specimens during the acute phase of infection. Positive results are indicative of the presence of the identified virus, but do not rule out bacterial infection or co-infection with other pathogens not detected by the test. Clinical correlation with patient history and other diagnostic information is necessary to determine patient infection status. The expected result is Negative.  Fact Sheet for Patients: BloggerCourse.com  Fact Sheet for Healthcare  Providers: SeriousBroker.it  This test is not yet approved or cleared by the [place] FDA and  has been authorized for detection and/or diagnosis of SARS-CoV-2 by FDA under an Emergency Use Authorization (EUA).  This EUA will remain in effect (meaning this test can  be used) for the duration of  the COVID-19 declaration under Section 564(b)(1) of the Act, 21 U.S.C. section 360bbb-3(b)(1), unless the authorization is terminated or revoked sooner.     Influenza A by PCR NEGATIVE NEGATIVE Final   Influenza B by PCR NEGATIVE NEGATIVE Final    Comment: (NOTE) The Xpert Xpress SARS-CoV-2/FLU/RSV plus assay is intended as an aid in the diagnosis of influenza from Nasopharyngeal swab specimens and should not be used as a sole basis for treatment. Nasal washings and aspirates are unacceptable for Xpert Xpress SARS-CoV-2/FLU/RSV testing.  Fact Sheet for Patients: BloggerCourse.com  Fact Sheet for Healthcare Providers: SeriousBroker.it  This test is not yet approved or cleared by the [place] FDA and has been authorized for detection and/or diagnosis of SARS-CoV-2 by FDA under an Emergency Use Authorization (EUA). This EUA will remain in effect (meaning this test can be used) for the duration of the COVID-19 declaration under Section 564(b)(1) of the Act, 21 U.S.C. section 360bbb-3(b)(1), unless the authorization is terminated or revoked.  Performed at [facility], [address], [place], [place] [address]   Culture, blood (Routine X 2) w Reflex to ID Panel     Status: None (Preliminary result)   Collection Time: 05/01/21  8:19 PM   Specimen: BLOOD  Result Value Ref Range Status   Specimen Description   Final    BLOOD LEFT HAND Performed at [facility], [address] [place], [place] [address]    Special Requests  Final    BOTTLES DRAWN AEROBIC ONLY  Blood Culture adequate volume Performed at [facility], [address], [place], [place] [address]    Culture   Final    NO GROWTH 2 DAYS Performed at [facility], [address], [place], [place] [address]    Report Status PENDING  Incomplete     Time coordinating discharge: 35 minutes  SIGNED:   Glade Lloyd, MD  [facility] 05/04/2021, 11:09 AM

## 2021-05-06 LAB — CULTURE, BLOOD (ROUTINE X 2)
Culture: NO GROWTH
Culture: NO GROWTH
Special Requests: ADEQUATE
Special Requests: ADEQUATE

## 2021-05-14 ENCOUNTER — Ambulatory Visit (INDEPENDENT_AMBULATORY_CARE_PROVIDER_SITE_OTHER): Payer: Medicaid Other | Admitting: Pediatrics

## 2021-05-14 ENCOUNTER — Other Ambulatory Visit: Payer: Self-pay

## 2021-05-14 VITALS — Wt 93.0 lb

## 2021-05-14 DIAGNOSIS — Z09 Encounter for follow-up examination after completed treatment for conditions other than malignant neoplasm: Secondary | ICD-10-CM | POA: Diagnosis not present

## 2021-05-14 DIAGNOSIS — J1282 Pneumonia due to coronavirus disease 2019: Secondary | ICD-10-CM | POA: Diagnosis not present

## 2021-05-14 DIAGNOSIS — U071 COVID-19: Secondary | ICD-10-CM | POA: Diagnosis not present

## 2021-05-14 DIAGNOSIS — F842 Rett's syndrome: Secondary | ICD-10-CM

## 2021-05-14 NOTE — Progress Notes (Signed)
Subjective:    Trameka is a 25 y.o. old female here with her mother and home nurse  for Follow-up [facility], Covid, pneumonia )   HPI: [place] presents with history of recent covid infection and pneumonia hospitalized for 4 days.  History of Rett syndrome, Gtube dependent, spastic quadraplegia.  She is on depakote, clonazapam, keppa.  Does follow up with Neurology in July but did not have to alter her medications. Seizures started last Friday and think it was caused by steroids.  She has seizures as a baseline but it does seem she is returning back to her normal.  Mom feels she is mostly back to her normal now and not requiring extra O2 and sats running 98-97%.      The following portions of the patient's history were reviewed and updated as appropriate: allergies, current medications, past family history, past medical history, past social history, past surgical history and problem list.  Review of Systems Pertinent items are noted in HPI.   Allergies: Allergies  Allergen Reactions   Cephalexin Other (See Comments)    seizure   Onfi [Clobazam]     Severe side effects   Other    Penicillins Shortness Of Breath     Current Outpatient Medications on File Prior to Visit  Medication Sig Dispense Refill   acetaminophen (TYLENOL) 160 MG/5ML liquid Place 480 mg into feeding tube every 4 (four) hours as needed for fever.     Calcium Carbonate Antacid (CALCIUM CARBONATE, DOSED IN MG ELEMENTAL CALCIUM,) 1250 MG/5ML SUSP Place 2 mLs (200 mg of elemental calcium total) into feeding tube 3 (three) times daily as needed for indigestion. 437 mL 0   cannabidiol (EPIDIOLEX) 100 MG/ML solution Place 350 mg into feeding tube in the morning and at bedtime.     cetirizine HCl (ZYRTEC) 1 MG/ML solution Place 10 mLs (10 mg total) into feeding tube daily.     clonazePAM (KLONOPIN) 0.25 MG disintegrating tablet DISSOLVE 1 TABLET IN MOUTH IN THE AM, AND 2 TAB AT NIGHT. ALSO CAN TAKE ONE TAB AS NEEDED FOR  BREAKTHROUGH SEIZURES. (Patient taking differently: Place 0.25 mg into feeding tube 2 (two) times daily.) 110 tablet 3   diazepam (DIASTAT ACUDIAL) 10 MG GEL Place 12.5 mg rectally daily as needed for seizure (seizure lasting more than 5 minutes).     divalproex (DEPAKOTE SPRINKLE) 125 MG capsule Take 375-500 mg by mouth See admin instructions. Per tube. Takes 3 capsules in the morning and afternoon and 4 capsules at night     guaiFENesin (ROBITUSSIN CHEST CONGESTION PO) Place 10 mLs into feeding tube daily as needed (cough).     ibuprofen (ADVIL) 100 MG/5ML suspension Place 300 mg into feeding tube every 4 (four) hours as needed for mild pain.     levETIRAcetam (KEPPRA) 100 MG/ML solution Place 2,000-2,250 mg into feeding tube See admin instructions. Takes 20 ml in the morning and 22.5 ml in the afternoon     mupirocin cream (BACTROBAN) 2 % Apply 1 application topically daily as needed (irritation).      polyethylene glycol powder (GLYCOLAX/MIRALAX) 17 GM/SCOOP powder Place 255 g into feeding tube daily as needed for mild constipation.     sodium chloride 0.9 % nebulizer solution Inhale 3 mLs into the lungs daily as needed for wheezing.     [DISCONTINUED] lacosamide (VIMPAT) 50 MG TABS tablet Take by mouth. (Patient not taking: No sig reported)     No current facility-administered medications on file prior to visit.  History and Problem List: Past Medical History:  Diagnosis Date   Fracture, humerus closed    G tube feedings (HCC)    Pneumonia    Recurrent fever of unknown cause 08/31/2012   Fever to 104-105 lasting 24 hrs, occuring once a week for the past 5 weeks (onset Sept 2013)   Rett's syndrome    Scoliosis    Seizure disorder (HCC)    Seizures (HCC)    Weight loss         Objective:    Wt 93 lb (42.2 kg) Comment: told by guardian  BMI 18.78 kg/m   General: alert, non toxic, non verbal, wheel chair bound ENT: oropharynx moist, no lesions, nares no discharge Eye:   PERRL, EOMI, conjunctivae clear, no discharge Ears: TM clear/intact bilateral, no discharge Neck: supple, no sig LAD Lungs: clear to auscultation, no wheeze, crackles or retractions, unlabored breathing Heart: RRR, Nl S1, S2, no murmurs Abd: soft, non tender, non distended, normal BS, no organomegaly, no masses appreciated, gtube Skin: no rashes Neuro: normal mental status, No focal deficits  No results found for this or any previous visit (from the past 72 hour(s)).     Assessment:   Zahara is a 25 y.o. old female with  1. Pneumonia due to COVID-19 virus   2. Hospital discharge follow-up   3. Rett syndrome     Plan:   1.  Back to baseline post covid pneumonia and recent increase in seizure activity.  Home nursing and mother feel she is much better and back to her normal self.  O2 sats have been stable 97-98% and on normal slow flow O2.  Discuss if concerns arise or symptoms return to have evaluated.      No orders of the defined types were placed in this encounter.    Return if symptoms worsen or fail to improve. in 2-3 days or prior for concerns  Myles Gip, DO

## 2021-05-16 ENCOUNTER — Encounter: Payer: Self-pay | Admitting: Pediatrics

## 2021-05-16 NOTE — Patient Instructions (Signed)
???????? ?????? ??????? ?? ???????? ??? ???????? Community-Acquired Pneumonia, Adult ???????? ?????? ???? ????? ????? ????? ???????? ?????? ?????? ???????? ????. ??? ???? ??? ??? ?????? ?????? ??????. ???? ???????? ?????? ???????? ?? ??????? ?????? ?????? ???? ????? ??? ???? ?? ???????? ?????? ??? ???? ?????? ?? ??????? ?????? ??????? ?????? ??????. ?????? ?? ???? ???????? ?????? ????? ???? ????? ?????? ??? ????? ????? ??????? ???????????. ??? ??? ?? ???? ??? ???????? ???? ??????? ?? ???. ??? ?? ????? ????? ??????????? ???? ?? ????? ?? ??? ??? ??? (?? ???? ?????)? ??? ???????? ?????? ???? ?? ???? ?? ??????? ???????. ?? ????? ??? ??????? ???? ???? ??? ?????? ????? ??: ?????????. ????????. ????????? ??? ?????? ?? ?????. ?? ????? ????? ?????? ???? ????? ??????? ??????? ?? ?????? ??????? ???? ??????: ??????? ??????? ???? ?????? ???: ??? ???? ????? (????) ?? ???? ??? ???????? ?????? ?????? (COPD) ?????? ???? ????? ??????? ?????? ??????? ?????? ????? ???? ??????? ???????? ?????? ??? ??????? ?????? (HIV). ????? ???? ???? ?? ????? ???? (???) ?????? ????? ?? ??????? ??? ???? ?????? ?? ???? ?? ?????. ??? ???? ?????? ?? ????? (???? ???????). ??????? ??????. ??????? ?? ????? ??????? ?? ?????? ???????? ???????. ??? ????? ??????? ?????? ????? (??? ???????? ?????? ???????). ????? ?????? ?????. ????? ??? ??????? ???? ???? ??? ???????? ???? ???? ???????? ??????. ????? ?????? ?? ??????? ?????? ?? ??????? ???????? ???? ???? ???????? ???? ???? ???????? ??????. ??? ???? (???? ?? 65 ?????). ?? ?????? ??? ?????? ?? ???????? ???? ????? ??? ?????? ?? ???: ???? ??? ?? ???? ??? ???? (???). ??????? ??????. ?????? ?? ????????. ??? ?? ?????? ???? ??? ?????? ???? ?? ??? ??????. ???? ?? ????? ?? ??? ??????. ????? (?????). ???? ?? ???????. ??? ????? ??? ??????? ???? ????? ??? ?????? ???????? ??? ?????? ?????? ?? ????? ????? ??????. ??? ?? ????? ?? ????? ????? ??? ?? ???: ???? ????? ??? ?????. ?????? ????? ?????  ???????? ???????? ?????? ?? ????. ??????: ????. ????? ?? ???? ????? (??????). ????? ?? ?????? ??????? ??? ????? (?????? ??????? ?? ??????). ?????. ??? ??? ???????? ?????? ??????? ??? ???? ?????? ?????? ???? ?????? ?????? ?????? ?????. ??? ?????? ??? ??????? ????? ???? ??? ?????? ??? ????? ?????? ??? ??? ???????? ??????? ???????? ????????????? ????? ???????? ?????? ?????? ???? ????? ????. ????? ???? ???? ???????? ?? ???????? ?????? ?? ??????. ???? ?? ??? ???????? ?? ?? ?? ???? ?????? ???????? ?????? ????? ?? ?????: ????? ?? ???? ???? ?? ???? ????? ??? ?????? ??? ?? ???: ?????? ?????? ??? ??? ??? ???????? ?????? ?? ?????????. ??????? ???? ???? ????????? (?????? ?????????)? ??? ??? ??? ???????? ?????? ???? ??????. ?????? ?????????. ??? ????? ???????? ?????? ???????? ??? ?????? ??? ????? ??? ???????? ???????: ??????? ????? ???? ?????.??? ??? ??????? ??????? ???? ???????? ??? ?????? ??? ??? ?? ?????? ?????? ????? ????? ?? ?? ?????? ????? ?????? ??? ???? ???????? ?? ???? ??? ????? ???. ??? ?????. ??? ????? ?????? ?? ?? ??????? ????????? ?? ?????? ??????? (??????) ?????? ?????? ??? ??????. ???? ????????? ??????? ?? ??????:  ??????? ????? ??????? ???? ????? ????? ???? ?? ??? ???? ???? ??? ?? ????? ?? ???? ??????? ??????. ?? ?????? ???? ?????? ??? ??? ??? ?????? ???? ?? ????? ?? ?????. ?????? ?? ????? ?????? ?? ???? ????? ?? ?????? ?? ?????? ??????? ?? ?????. ??? ??? ?? ???? ??????? ?????? ?????? ??????? ???????? ??? ????????. ?? ????? ?? ????? ?????? ?????? ??? ??? ???? ???? ????? ?????. ??? ??????     ?? ?????? ????????? ????????. ?? ?????? ?????? ????? ??? ????????? ?? ????? ??? ??????? ???????? ??????????? ???? ?????. ?????? ???? ??????? ?????? ??? ????? ??? ???????? ??????? ?? ???????. ???? ???? ????? ???. ????? ??? ??? ?????? ?? ??????? ?????????? ??????? ??????? ??????? ??????? ????? ????? ??????????? ????? ??????. ??????? ???? ????? ??????? ????? ???  8 ????? ??? ????? ?? ?????? ??  ????. ???? ???? ?????? ????? ?? ????? ?????. ????? ??? ???? ?????? ????? ??? ???? ?? ????? ??? ???? ?? ??? ???? ??? ???????. ???? ?????? ?????? ??????? ??? ??????? ??????? ??????? ??????. ?????? ???? ??????? ?????? ???? ??????? ?????? ??. ???? ????? ????? ?? ??????? ???? ??? ???? ?????? ??????. ?????? ??? ??? ????? ?????? ?? ?????. ??? ??? ????? ?? ?????? ?? ?????? ??????? ??? ???? ???? ????? ??????? ?? 3-4 ???? ?????? ?? ??? ??????. ??????? ????? ????? ??????? ??? ?? ??? ??? ????? ??? ????? ????? (3-6 ??) ?? ?????? ?? ??? ??? ???? (237 ??). ????? ????? ?????? ???????? ??? ??????? ???? ??????? ??????. ???? ??? ???. ??? ????? ??????? ?? ???? ????? ???? ?? ??? ?????? ????????? ?????? ??????? ?? ??????? ???? ?? ???: ??????? ??? ???? ???????? ??????. ????? ????? ?????? ?? ?????? ???????? ??????? ???? ???? ???? ????? ???????. ????? ???? ??????? ?????? ??????? ?? ?? ?????? ?????? ??. ??? ?? ?????? ??? ???? ???????? ?????? ???: ??? ???? ???? ?? 65 ?????. ??? ???? ?????? ?? ??? 19-65 ????? ????? ?????? ?? ???????? ?? ????? ?? ??? ???? ????? ?? ????? ?? ?????? ???? ???? ???? ??? ???? ???????. ????? ??????? ??????? ?????? ??????? ?? ??? ??? ??? ??? ????? ????. ?????? ??? ???? ?????????? ?? ????. ?????? ?? ???? ??????? ?????? ??????? ?? ?? ??? ???? ?????????? ?????? ??. ???????? ?? ?????? ??? ???? ???????? ???? ??????. ??? ???? ?????? ?????? ???????? ???? 20 ????? ??? ?????. ???? ?? ????? ????? ????????? ???? ??????? ???? ??????. ???? ???????? ??????? ?????? ??? ????? ????? ??????? ???????: ??????? ??????. ???????? ?? ?????? ?? ?????? ???? ??? ????? ???? ?????? ??????. ???? ???????? ????? ?? ??????? ???????: ????? ??? ??????. ????? ??? ?????. ????? ?????? ????? ??? ??? ?? ???? ???? ?? ?????? ??? ???? ???????. ???? ?? ??? ??????. ???? ???? ??? ??????? ????? ????? ????? ???? ???? ?????. ?? ????? ?? ??? ???? ??????? ????? ?? ??. ??? ???? ??? ???????? ?????? ??? ?????. ???? ?????? ??????? ??????? (911 ??  ???????? ??????? ?????????). ?? ??? ??????? ????? ??? ????????. ???? ???????? ?????? ???? ???? ???????. ???? ???????? ?????? ??????? ?? ??????? ????? ?? ?????? ?????? ?? ???????? ??? ?????? ?? ??????. ??? ???? ???? ??????? ?? ?????. ???? ?????? ??? ?????? ?? ???? ????? ???????? ??????? ?? ?????? ?????????. ?? ????? ????? ???????? ?????? ??????? ??? ??????? ?? ???????? ?? ?????? ??? ???? ???????? ?? ??????. ??? ????? ?? ??? ????????? ?? ???? ?????? ????????? ???? ?????? ???? ?????????????. ???? ?? ?????? ??? ????? ???? ?? ???? ?? ???? ??????? ??????.? Document Revised: 10/31/2019 Document Reviewed: 10/31/2019 Elsevier Patient Education  2022 [employer].

## 2021-07-01 ENCOUNTER — Telehealth: Payer: Self-pay

## 2021-07-04 ENCOUNTER — Other Ambulatory Visit: Payer: Self-pay | Admitting: Pediatrics

## 2021-07-05 ENCOUNTER — Other Ambulatory Visit: Payer: Self-pay | Admitting: Pediatrics

## 2021-08-01 ENCOUNTER — Telehealth: Payer: Self-pay | Admitting: Pediatrics

## 2021-08-01 DIAGNOSIS — F842 Rett's syndrome: Secondary | ICD-10-CM

## 2021-08-01 DIAGNOSIS — G8250 Quadriplegia, unspecified: Secondary | ICD-10-CM

## 2021-08-01 DIAGNOSIS — Z151 Genetic susceptibility to epilepsy and neurodevelopmental disorders: Secondary | ICD-10-CM

## 2021-08-01 NOTE — Telephone Encounter (Signed)
Mom called in regard to the prescription that her and Crystal spoke about. Asked to speak with Crystal.   [phone]

## 2021-08-02 NOTE — Telephone Encounter (Signed)
Mother called stating patient needs a order sent for PT- bath chair since her chair broke. Mother would like it faxed to [facility] in [place] at [phone]. When order is sent she would like a call stating we have sent it via fax.

## 2021-08-02 NOTE — Telephone Encounter (Signed)
Order for bath chair written

## 2021-09-27 ENCOUNTER — Ambulatory Visit (INDEPENDENT_AMBULATORY_CARE_PROVIDER_SITE_OTHER): Payer: Medicaid Other | Admitting: Pediatrics

## 2021-09-27 ENCOUNTER — Other Ambulatory Visit: Payer: Self-pay

## 2021-09-27 ENCOUNTER — Encounter: Payer: Self-pay | Admitting: Pediatrics

## 2021-09-27 VITALS — BP 118/60 | Ht 59.0 in | Wt 90.0 lb

## 2021-09-27 DIAGNOSIS — R0902 Hypoxemia: Secondary | ICD-10-CM

## 2021-09-27 DIAGNOSIS — Z9981 Dependence on supplemental oxygen: Secondary | ICD-10-CM | POA: Diagnosis not present

## 2021-09-27 DIAGNOSIS — F842 Rett's syndrome: Secondary | ICD-10-CM | POA: Diagnosis not present

## 2021-09-27 DIAGNOSIS — Z931 Gastrostomy status: Secondary | ICD-10-CM

## 2021-09-27 NOTE — Patient Instructions (Signed)
Seizure, Adult °A seizure is a sudden burst of abnormal electrical and chemical activity in the brain. Seizures usually last from 30 seconds to 2 minutes.  °What are the causes? °Common causes of this condition include: °Fever or infection. °Problems that affect the brain. These may include: °A brain or head injury. °Bleeding in the brain. °A brain tumor. °Low levels of blood sugar or salt. °Kidney problems or liver problems. °Conditions that are passed from parent to child (are inherited). °Problems with a substance, such as: °Having a reaction to a drug or a medicine. °Stopping the use of a substance all of a sudden (withdrawal). °A stroke. °Disorders that affect how you develop. °Sometimes, the cause may not be known.  °What increases the risk? °Having someone in your family who has epilepsy. In this condition, seizures happen again and again over time. They have no clear cause. °Having had a tonic-clonic seizure before. This type of seizure causes you to: °Tighten the muscles of the whole body. °Lose consciousness. °Having had a head injury or strokes before. °Having had a lack of oxygen at birth. °What are the signs or symptoms? °There are many types of seizures. The symptoms vary depending on the type of seizure you have. °Symptoms during a seizure °Shaking that you cannot control (convulsions) with fast, jerky movements of muscles. °Stiffness of the body. °Breathing problems. °Feeling mixed up (confused). °Staring or not responding to sound or touch. °Head nodding. °Eyes that blink, flutter, or move fast. °Drooling, grunting, or making clicking sounds with your mouth °Losing control of when you pee or poop. °Symptoms before a seizure °Feeling afraid, nervous, or worried. °Feeling like you may vomit. °Feeling like: °You are moving when you are not. °Things around you are moving when they are not. °Feeling like you saw or heard something before (déjà vu). °Odd tastes or smells. °Changes in how you see. You may  see flashing lights or spots. °Symptoms after a seizure °Feeling confused. °Feeling sleepy. °Headache. °Sore muscles. °How is this treated? °If your seizure stops on its own, you will not need treatment. If your seizure lasts longer than 5 minutes, you will normally need treatment. Treatment may include: °Medicines given through an IV tube. °Avoiding things, such as medicines, that are known to cause your seizures. °Medicines to prevent seizures. °A device to prevent or control seizures. °Surgery. °A diet low in carbohydrates and high in fat (ketogenic diet). °Follow these instructions at home: °Medicines °Take over-the-counter and prescription medicines only as told by your doctor. °Avoid foods or drinks that may keep your medicine from working, such as alcohol. °Activity °Follow instructions about driving, swimming, or doing things that would be dangerous if you had another seizure. Wait until your doctor says it is safe for you to do these things. °If you live in the [place] ask your local department of motor vehicles when you can drive. °Get a lot of rest. °Teaching others ° °Teach friends and family what to do when you have a seizure. They should: °Help you get down to the ground. °Protect your head and body. °Loosen any clothing around your neck. °Turn you on your side. °Know whether or not you need emergency care. °Stay with you until you are better. °Also, tell them what not to do if you have a seizure. Tell them: °They should not hold you down. °They should not put anything in your mouth. °General instructions °Avoid anything that gives you seizures. °Keep a seizure diary. Write down: °What you remember   about each seizure. °What you think caused each seizure. °Keep all follow-up visits. °Contact a doctor if: °You have another seizure or seizures. Call the doctor each time you have a seizure. °The pattern of your seizures changes. °You keep having seizures with treatment. °You have symptoms of being sick or  having an infection. °You are not able to take your medicine. °Get help right away if: °You have any of these problems: °A seizure that lasts longer than 5 minutes. °Many seizures in a row and you do not feel better between seizures. °A seizure that makes it harder to breathe. °A seizure and you can no longer speak or use part of your body. °You do not wake up right after a seizure. °You get hurt during a seizure. °You feel confused or have pain right after a seizure. °These symptoms may be an emergency. Get help right away. Call your local emergency services (911 in the [place] °Do not wait to see if the symptoms will go away. °Do not drive yourself to the hospital. °Summary °A seizure is a sudden burst of abnormal electrical and chemical activity in the brain. Seizures normally last from 30 seconds to 2 minutes. °Causes of seizures include illness, injury to the head, low levels of blood sugar or salt, and certain conditions. °Most seizures will stop on their own in less than 5 minutes. Seizures that last longer than 5 minutes are a medical emergency and need treatment right away. °Many medicines are used to treat seizures. Take over-the-counter and prescription medicines only as told by your doctor. °This information is not intended to replace advice given to you by your health care provider. Make sure you discuss any questions you have with your health care provider. °Document Revised: 05/11/2020 Document Reviewed: 05/11/2020 °Elsevier Patient Education © 2022 [employer] Inc[employer] ° °

## 2021-09-29 ENCOUNTER — Encounter: Payer: Self-pay | Admitting: Pediatrics

## 2021-09-29 MED ORDER — CLONAZEPAM 0.25 MG PO TBDP: 0.2500 mg | ORAL_TABLET | Freq: Two times a day (BID) | ORAL | 3 refills | Status: DC

## 2021-09-29 NOTE — Progress Notes (Signed)
    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 Sleep study in Dec 22--decision ot be made on BIPAP use --would need a BLOOD gas to check on hypercapnia    Developmental History Developmental delay--Rett's syndrome  Function: Mobility: manual wheelchair Pain concerns: no Hand function: Right: reduced dexterity Left: reduced dexterity Spine curvature: mild Swallowing: normal and modified diet (pureed) Toileting: dependent   Equipment:  Warden/ranger Chest vest Suction tubes and suction machine Nebulizer Pulse ox Oxygen tubes and tank Diapers Wipes and gloves G -tube button with Tubings Non invasive vent--benefits form and using vent therapy   Oral feeds--via G tube Osmolite--1.5 cals. Bolus 240 mls and bite sized pieces of food with regular liquids.  Therapy----none   Specialists- [facility] --[facility]---   Review of Systems:  Hearing: impaired Seizures: yes - controlled Constipation: no GE reflux: yes  Fractures: no  The following portions of the patient's history were reviewed and updated as appropriate: allergies, current medications, past family history, past medical history, past social history, past surgical history and problem list.  Referrals needed-- Blood gas for BIPAP  To Continue ventilator--E0466Vent  Objective:    Physical Exam Wt 90 lbs Height--59 in Cognition: non-interactive Respiratory: normal, no increased effort on oxygen and vent support Lower extremity function:In wheelchair Abdomen: normal-- G tube present Spine scoliosis: mild --with rod Sitting Ability: assisted Gait: wheelchair    Assessment:   Developmental delay with Rett's  Syndrome   Plan:    1. Gross motor: delayed 2. Fine motor/ADL: delayed 3. Educational/vocational: n/a 4. Transition skills: n/a 5. Speech/swallowing: no speech, GERD  Requires-  To Continue ventilator--E0466Vent

## 2021-10-05 NOTE — Telephone Encounter (Signed)
Open an error.

## 2021-10-22 ENCOUNTER — Telehealth: Payer: Self-pay | Admitting: Pediatrics

## 2021-10-22 NOTE — Telephone Encounter (Signed)
Mother called and stated that the medication that Dr.Ram sent to the pharmacy was correct, but the dosage is supposed to be 1 in the morning 2 at night and extra for breakthrough seizures.Mother states there should be 15 extra. [facility].  [facility].

## 2021-10-23 MED ORDER — CLONAZEPAM 0.25 MG PO TBDP: ORAL_TABLET | ORAL | 3 refills | Status: DC

## 2021-10-23 NOTE — Telephone Encounter (Signed)
Called in correct amount --15 extra

## 2022-03-11 ENCOUNTER — Telehealth: Payer: Self-pay | Admitting: Pediatrics

## 2022-03-11 DIAGNOSIS — G808 Other cerebral palsy: Secondary | ICD-10-CM

## 2022-03-11 NOTE — Telephone Encounter (Signed)
Order for PT/O Evaluation for a new bath chair ?

## 2022-04-02 ENCOUNTER — Telehealth: Payer: Self-pay

## 2022-04-02 NOTE — Telephone Encounter (Signed)
Mother is unsure of who to go to since Kimmie is having pain on her left side, mother took her to get x-rays at a orthopedic urgent clinic. Found nothing in the images, but mother is still worried as her very high pulse cries when picked up. Asked to for advice on where or who she should go.  ?

## 2022-04-07 NOTE — Telephone Encounter (Signed)
Spoke to mom and she found out she fractured her ankle

## 2022-05-20 ENCOUNTER — Emergency Department (HOSPITAL_BASED_OUTPATIENT_CLINIC_OR_DEPARTMENT_OTHER)
Admission: EM | Admit: 2022-05-20 | Discharge: 2022-05-20 | Disposition: A | Discharge status: Home / Self Care | Payer: Medicaid Other | Attending: Emergency Medicine | Admitting: Emergency Medicine

## 2022-05-20 ENCOUNTER — Emergency Department (HOSPITAL_BASED_OUTPATIENT_CLINIC_OR_DEPARTMENT_OTHER): Payer: Medicaid Other

## 2022-05-20 ENCOUNTER — Other Ambulatory Visit: Payer: Self-pay

## 2022-05-20 ENCOUNTER — Emergency Department (HOSPITAL_BASED_OUTPATIENT_CLINIC_OR_DEPARTMENT_OTHER): Payer: Medicaid Other | Admitting: Radiology

## 2022-05-20 DIAGNOSIS — S82191A Other fracture of upper end of right tibia, initial encounter for closed fracture: Secondary | ICD-10-CM | POA: Diagnosis not present

## 2022-05-20 DIAGNOSIS — S5012XA Contusion of left forearm, initial encounter: Secondary | ICD-10-CM | POA: Insufficient documentation

## 2022-05-20 DIAGNOSIS — R000 Tachycardia, unspecified: Secondary | ICD-10-CM | POA: Insufficient documentation

## 2022-05-20 DIAGNOSIS — X58XXXA Exposure to other specified factors, initial encounter: Secondary | ICD-10-CM | POA: Insufficient documentation

## 2022-05-20 DIAGNOSIS — S82101A Unspecified fracture of upper end of right tibia, initial encounter for closed fracture: Secondary | ICD-10-CM

## 2022-05-20 DIAGNOSIS — S8991XA Unspecified injury of right lower leg, initial encounter: Secondary | ICD-10-CM | POA: Diagnosis present

## 2022-05-20 DIAGNOSIS — M7989 Other specified soft tissue disorders: Secondary | ICD-10-CM | POA: Diagnosis not present

## 2022-05-20 LAB — URINALYSIS, ROUTINE W REFLEX MICROSCOPIC
Bilirubin Urine: NEGATIVE
Glucose, UA: NEGATIVE mg/dL
Hgb urine dipstick: NEGATIVE
Leukocytes,Ua: NEGATIVE
Nitrite: NEGATIVE
Protein, ur: 30 mg/dL — AB
Specific Gravity, Urine: 1.033 — ABNORMAL HIGH (ref 1.005–1.030)
pH: 6.5 (ref 5.0–8.0)

## 2022-05-20 MED ORDER — LACTATED RINGERS IV BOLUS: 500.0000 mL | Freq: Once | INTRAVENOUS | Status: DC

## 2022-05-20 NOTE — Discharge Instructions (Addendum)
Please keep splint in place when doing any movement Please watch for any evidence of skin breakdown or pain in the lower leg. Call Dr. Jeannetta Ellis office tomorrow for recheck Use acetaminophen and ibuprofen as needed for any pain

## 2022-05-20 NOTE — ED Notes (Signed)
Attempted x2 to in and out cath without success.

## 2022-05-20 NOTE — ED Notes (Signed)
X-ray at bedside. Will start IV and bloodwork when they are done with imaging

## 2022-05-20 NOTE — ED Notes (Signed)
RN called APS per MD request: Patient is wheelchair bound and non-verbal with fractures to the right leg. Concerns for patient safety regarding a new caregiver per mother. MD informed patient's mother that due to the concerns expressed an APS report would be made.   This RN called and spoke to Kia who said we would receive another call.
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
## 2022-05-20 NOTE — ED Triage Notes (Addendum)
Patient arrives with complaints of right knee pain x3 days. Patient is wheelchair bound and her mother noted when she was moving her that she was grimacing when they put her to bed.  Mother also reports elevated heart rate as well (HR in 130's).   Patient was given Ibuprofen by g-tube around 0745.

## 2022-05-20 NOTE — ED Notes (Signed)
Attempted IV stick x1 without success. 

## 2022-05-20 NOTE — ED Notes (Signed)
Attempted

## 2022-05-21 ENCOUNTER — Telehealth: Payer: Self-pay | Admitting: Pediatrics

## 2022-05-21 NOTE — Telephone Encounter (Signed)
Mother called and stated that she would like to speak with Dr.Ram in regard to [facility] forms. Mother is aware that Dr.Ram is out of office today.

## 2022-05-22 MED ORDER — VITAMIN D (ERGOCALCIFEROL) 1.25 MG (50000 UNIT) PO CAPS: 50000.0000 [IU] | ORAL_CAPSULE | ORAL | 3 refills | Status: AC

## 2022-05-22 NOTE — Telephone Encounter (Signed)
Spoke to mom and she will send in forms to office to be filled

## 2022-05-23 ENCOUNTER — Telehealth: Payer: Self-pay | Admitting: Pediatrics

## 2022-05-23 NOTE — Telephone Encounter (Signed)
A nurse called to check on an order that was sent over for [facility]. The nurse states that Jo Matthews is already taking 1,000 mg of vitamin D2 daily. Nurse stated that Dr.Ram sent in for Vitamin D2 50,000 units for once a week. Nurse wanted to clarify if the daily supplement should continue or only the weekly. Can call or fax [facility] to let them know.

## 2022-05-27 NOTE — Telephone Encounter (Signed)
Discussed with nurse that we are to discontinue vit d 1000 IU daily ---to now 50,000 IU weekly and for her to come in for labs in a few weeks

## 2022-06-06 ENCOUNTER — Telehealth: Payer: Self-pay | Admitting: Pediatrics

## 2022-06-06 NOTE — Telephone Encounter (Signed)
Mother dropped off FMLA forms to be completed. Placed in Dr. Laurence Aly office in basket.  Mother requested to be called once forms have been completed.  Jo Matthews  [phone]

## 2022-06-11 NOTE — Telephone Encounter (Signed)
FMLA form filled 

## 2022-06-18 ENCOUNTER — Telehealth: Payer: Self-pay | Admitting: Pediatrics

## 2022-06-18 NOTE — Telephone Encounter (Signed)
Patient's home health nurse Efraim Kaufmann) called requesting to speak with Dr. Ardyth Man. Efraim Kaufmann states that she needs a verbal order from the provider for a prescription that was prescribed by the hospital a little over a year ago. Nurse states the mother is just now giving the Calcium Carbonate Antacid to the patient and she would need a verbal order form the patient's PCP before administering the medication.   Melissa also stated that the pharmacy had sent over an order for a refill on the patient's clonazepam and nyostatin cream.   Efraim Kaufmann [phone]

## 2022-06-19 MED ORDER — CLONAZEPAM 0.25 MG PO TBDP: ORAL_TABLET | ORAL | 3 refills | Status: DC

## 2022-06-19 MED ORDER — NYSTATIN 100000 UNIT/GM EX CREA
1.0000 | TOPICAL_CREAM | Freq: Three times a day (TID) | CUTANEOUS | 3 refills | Status: AC
Start: 2022-06-19 — End: 2022-07-03

## 2022-07-23 ENCOUNTER — Other Ambulatory Visit: Payer: Self-pay | Admitting: Pediatrics

## 2022-11-13 ENCOUNTER — Other Ambulatory Visit: Payer: Self-pay | Admitting: Pediatrics

## 2022-12-15 ENCOUNTER — Other Ambulatory Visit: Payer: Self-pay | Admitting: Pediatrics

## 2022-12-24 ENCOUNTER — Telehealth: Payer: Self-pay | Admitting: Pediatrics

## 2022-12-24 NOTE — Telephone Encounter (Signed)
Spoke with provider and scheduled for 12/26/2022. Called mother and made her aware of appointment. Provider will check order levels and see if blood work can be done in office.

## 2022-12-24 NOTE — Telephone Encounter (Signed)
Nurse called stating that mother spoke with Dr. Laurice Record, MD, and recalled the provider wanting to do blood work to check for her vitamin D levels as well as her med levels. Nurse stated mother requested for an appointment first thing on a Tuesday or Friday morning. Informed nurse that a message would be sent to the provider to double deck if a more in depth appointment is needed. Stated that the provider is out of office but would be returning back after lunch. Nurse requested to call mother back when we have an appointment date and time available. Stated we would give her a call back by the end of the day.  Mordecai Rasmussen [phone]  Lenna Sciara [phone]

## 2022-12-26 ENCOUNTER — Ambulatory Visit (INDEPENDENT_AMBULATORY_CARE_PROVIDER_SITE_OTHER): Payer: Medicaid Other | Admitting: Pediatrics

## 2022-12-26 DIAGNOSIS — E559 Vitamin D deficiency, unspecified: Secondary | ICD-10-CM | POA: Diagnosis not present

## 2022-12-26 DIAGNOSIS — G808 Other cerebral palsy: Secondary | ICD-10-CM | POA: Diagnosis not present

## 2022-12-28 ENCOUNTER — Encounter: Payer: Self-pay | Admitting: Pediatrics

## 2022-12-28 DIAGNOSIS — E559 Vitamin D deficiency, unspecified: Secondary | ICD-10-CM | POA: Insufficient documentation

## 2022-12-28 NOTE — Telephone Encounter (Signed)
Scheduled appointment

## 2022-12-28 NOTE — Progress Notes (Signed)
LABS drawn as per neurology orders --tolerated well

## 2023-01-08 ENCOUNTER — Ambulatory Visit (INDEPENDENT_AMBULATORY_CARE_PROVIDER_SITE_OTHER): Payer: Medicaid Other | Admitting: Pediatrics

## 2023-01-08 VITALS — BP 104/74 | Ht 59.0 in | Wt 98.0 lb

## 2023-01-08 DIAGNOSIS — G808 Other cerebral palsy: Secondary | ICD-10-CM

## 2023-01-08 DIAGNOSIS — Z0000 Encounter for general adult medical examination without abnormal findings: Secondary | ICD-10-CM

## 2023-01-08 DIAGNOSIS — Z68.41 Body mass index (BMI) pediatric, less than 5th percentile for age: Secondary | ICD-10-CM

## 2023-01-08 DIAGNOSIS — G8250 Quadriplegia, unspecified: Secondary | ICD-10-CM

## 2023-01-08 DIAGNOSIS — Z931 Gastrostomy status: Secondary | ICD-10-CM

## 2023-01-08 DIAGNOSIS — J9622 Acute and chronic respiratory failure with hypercapnia: Secondary | ICD-10-CM

## 2023-01-08 DIAGNOSIS — F842 Rett's syndrome: Secondary | ICD-10-CM

## 2023-01-08 DIAGNOSIS — J9621 Acute and chronic respiratory failure with hypoxia: Secondary | ICD-10-CM

## 2023-01-08 DIAGNOSIS — E559 Vitamin D deficiency, unspecified: Secondary | ICD-10-CM | POA: Diagnosis not present

## 2023-01-08 LAB — CBC WITH DIFFERENTIAL/PLATELET
Absolute Monocytes: 518 cells/uL (ref 200–950)
Basophils Absolute: 52 cells/uL (ref 0–200)
Basophils Relative: 0.7 %
Eosinophils Absolute: 259 cells/uL (ref 15–500)
Eosinophils Relative: 3.5 %
HCT: 33.4 % — ABNORMAL LOW (ref 35.0–45.0)
Hemoglobin: 11.4 g/dL — ABNORMAL LOW (ref 11.7–15.5)
Lymphs Abs: 3330 cells/uL (ref 850–3900)
MCH: 33.7 pg — ABNORMAL HIGH (ref 27.0–33.0)
MCHC: 34.1 g/dL (ref 32.0–36.0)
MCV: 98.8 fL (ref 80.0–100.0)
MPV: 10.5 fL (ref 7.5–12.5)
Monocytes Relative: 7 %
Neutro Abs: 3241 cells/uL (ref 1500–7800)
Neutrophils Relative %: 43.8 %
Platelets: 301 10*3/uL (ref 140–400)
RBC: 3.38 10*6/uL — ABNORMAL LOW (ref 3.80–5.10)
RDW: 12.8 % (ref 11.0–15.0)
Total Lymphocyte: 45 %
WBC: 7.4 10*3/uL (ref 3.8–10.8)

## 2023-01-08 LAB — COMPREHENSIVE METABOLIC PANEL
AG Ratio: 1.5 (calc) (ref 1.0–2.5)
ALT: 6 U/L (ref 6–29)
AST: 11 U/L (ref 10–30)
Albumin: 3.8 g/dL (ref 3.6–5.1)
Alkaline phosphatase (APISO): 59 U/L (ref 31–125)
BUN/Creatinine Ratio: 50 (calc) — ABNORMAL HIGH (ref 6–22)
BUN: 11 mg/dL (ref 7–25)
CO2: 30 mmol/L (ref 20–32)
Calcium: 9.3 mg/dL (ref 8.6–10.2)
Chloride: 99 mmol/L (ref 98–110)
Creat: 0.22 mg/dL — ABNORMAL LOW (ref 0.50–0.96)
Globulin: 2.5 g/dL (calc) (ref 1.9–3.7)
Glucose, Bld: 130 mg/dL — ABNORMAL HIGH (ref 65–99)
Potassium: 3.7 mmol/L (ref 3.5–5.3)
Sodium: 141 mmol/L (ref 135–146)
Total Bilirubin: 0.1 mg/dL — ABNORMAL LOW (ref 0.2–1.2)
Total Protein: 6.3 g/dL (ref 6.1–8.1)

## 2023-01-08 LAB — LEVETIRACETAM LEVEL: Keppra (Levetiracetam): 41.7 ug/mL

## 2023-01-08 LAB — VITAMIN D 1,25 DIHYDROXY
Vitamin D 1, 25 (OH)2 Total: 35 pg/mL (ref 18–72)
Vitamin D2 1, 25 (OH)2: 15 pg/mL
Vitamin D3 1, 25 (OH)2: 20 pg/mL

## 2023-01-08 LAB — VALPROIC ACID LEVEL: Valproic Acid Lvl: 75 mg/L (ref 50.0–100.0)

## 2023-01-08 MED ORDER — VITAMIN D (ERGOCALCIFEROL) 1.25 MG (50000 UNIT) PO CAPS: 50000.0000 [IU] | ORAL_CAPSULE | ORAL | 0 refills | Status: DC

## 2023-01-09 ENCOUNTER — Encounter: Payer: Self-pay | Admitting: Pediatrics

## 2023-01-09 NOTE — Progress Notes (Signed)
  History of Present Illness Main concerns today are:  Routine care and follow up for Rett's syndrome and developmental delay.  SHOWER CHAIR needed for daily use.  Ventilator dependent--needs ventllator tubings and supplies G tube dependent ---needs tubes and extension sets.   Developmental History Developmental delay--Rett's syndrome  Function: Mobility: manual wheelchair Pain concerns: no Hand function: Right: reduced dexterity Left: reduced dexterity Spine curvature: mild Swallowing: normal and modified diet (pureed) Toileting: dependent   Equipment:  Publishing copy Chest vest Suction tubes and suction machine Nebulizer Pulse ox Oxygen tubes and tank Diapers Wipes and gloves G -tube button with Tubings BATH CHAIR NEEDED Nursing care 59 hours a week Non invasive vent--benefits form and using vent therapy   Oral feeds--via G tube Osmolite--1.5 cals. Bolus 240 mls and bite sized pieces of food with regular liquids.  Therapy----none   Specialists- [facility]--[facility]--[facility] --[facility]---[place]   Review of Systems:  Hearing: impaired Seizures: yes - controlled Constipation: no GE reflux: yes  Fractures: no  The following portions of the patient's history were reviewed and updated as appropriate: allergies, current medications, past family history, past medical history, past social history, past surgical history and problem list.  Referrals needed-- Blood gas for BIPAP  To Continue ventilator--[id number]  Objective:    Physical Exam Wt 90 lbs Height--59 in Cognition: non-interactive Respiratory: normal, no increased effort on oxygen and vent support Lower extremity function:In wheelchair Abdomen: normal-- G tube present Spine scoliosis: mild --with rod Sitting Ability: assisted Gait: wheelchair    Assessment:   Developmental delay \  Patient Active Problem List    Diagnosis Date Noted   Vitamin D deficiency 12/28/2022   Sepsis ([place]) 05/01/2021   Acute on chronic respiratory failure with hypoxia and hypercapnia (HCC) 05/01/2021   Acute hypoxemic respiratory failure due to COVID-19 ([place]) 05/01/2021   Tinea unguium 02/19/2021   Annual physical exam 02/19/2021   BMI (body mass index), pediatric, less than 5th percentile for age 67/17/2020   Hypercapnic respiratory failure, chronic ([place]) 07/16/2018   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 Gross motor: delayed 2. Fine motor/ADL: delayed 3. Educational/vocational: n/a 4. Transition skills: n/a 5. Speech/swallowing: no speech, GERD  Requires- Wheelchair Hoyer lift Chest vest Suction tubes and suction machine Nebulizer Pulse ox Oxygen tubes and tank Diapers Wipes and gloves G -tube button with Tubings BATH CHAIR NEEDED Nursing care 59 hours a week Non invasive vent--benefits form and using vent therapy

## 2023-01-09 NOTE — Patient Instructions (Signed)
How to Use a Nebulizer, Adult  A nebulizer is a device that turns liquid medicine into a mist or vapor that you can breathe in (inhale). This medicine helps to open the air passages in your lungs. You may need to use a nebulizer if you have an acute breathing illness, such as pneumonia. A nebulizer may also be used to treat chronic conditions, such as asthma or chronic obstructive pulmonarydisease (COPD). There are different kinds of nebulizers. With some nebulizers, you breathe in medicine through a mouthpiece. With others, you get medicine through a mask that fits over your nose and mouth. What are the risks? If you use a nebulizer that does not fit right or is not cleaned properly, it can cause some problems, including: Infection. Eye irritation. Delivery of too much medicine or not enough medicine. Mouth irritation. Supplies needed: Air compressor (nebulizer machine). Nebulizer medicine cup (reservoir)and tubing. Mouthpiece or face mask. Soap and water. Sterile or distilled water. Clean towel. How to use a nebulizer     Preparing a nebulizer Take these steps before using your nebulizer: Read the manufacturer's instructions for your nebulizer, as machines vary. Check your medicine. Make sure it has not expired and is not damaged in any way. Wash your hands with soap and water. Put all of the parts of your nebulizer on a sturdy, flat surface. Connect the tubing to the nebulizer machine and to the reservoir. Measure the liquid medicine according to instructions from your health care provider. Pour the liquid into the reservoir. Attach the mouthpiece or mask. Test the nebulizer by turning it on to make sure that a spray comes out. Then, turn it off. Using a nebulizer Be sure to stop the machine at any time if you start coughing or if the medicine foams or bubbles. Sit in an upright, relaxed position. If your nebulizer has a mask, put it over your nose and mouth. It should fit  somewhat snugly, with no gaps around the nose or cheeks where medicine could escape. If you use a mouthpiece, put it in your mouth. Press your lips firmly around the mouthpiece. Turn on the nebulizer. Some nebulizers have a finger valve. If yours does, cover up the air hole so the air gets to the nebulizer. Once the medicine begins to mist out, take slow, deep breaths. If there is a finger valve, release it at the end of your breath. Continue taking slow, deep breaths until the medicine in the nebulizer is gone and no mist appears. Cleaning a nebulizer The nebulizer and all of its parts must be kept very clean. If the nebulizer and its parts are not cleaned properly, bacteria can grow inside of them. If you inhale the bacteria, you can get sick. Follow the manufacturer's instructions for cleaning your nebulizer. For most nebulizers, you should follow these guidelines: Clean the mouthpiece or mask and the reservoir by: Rinsing them after each use. Use sterile or distilled water. Washing them 1-2 times a week using soap and warm water. Do not wash the tubing. After you rinse or wash them, place the parts on a clean towel and let them air-dry completely. After they dry, reconnect the pieces and turn the nebulizer on without any medicine in it. Doing this will blow air through the equipment to help dry it out. Store the nebulizer in a clean and dust-free place. Check the filter at least one time every week. Replace the filter if it looks dirty. Follow these instructions at home Use your nebulizer  only as told by your health care provider. Do not use the nebulizer more than directed by your health care provider. Do not use any products that contain nicotine or tobacco, such as cigarettes, e-cigarettes, and chewing tobacco. If you need help quitting, ask your health care provider. Keep all follow-up visits as told by your health care provider. This is important. Where to find more information Allergy &  Asthma Network: allergyasthmanetwork.org American Lung Association: www.lung.org Contact a health care provider if: You have trouble using the nebulizer. Your nebulizer foams or stops working. Your nebulizer does not create a mist after you add medicine and turn it on. Get help right away if: You continue to have trouble breathing. Your breathing gets worse during a nebulizer treatment. These symptoms may represent a serious problem that is an emergency. Do not wait to see if the symptoms will go away. Get medical help right away. Call your local emergency services (911 in the [place] Do not drive yourself to the hospital. Summary A nebulizer is a device that turns liquid medicine into a mist (vapor) that you can breathe in (inhale). Measure the liquid medicine according to instructions from your health care provider. Pour the liquid into the part of the nebulizer that holds the medicine (reservoir). Once the medicine begins to mist out, take slow, deep breaths. Rinse or wash the mouthpiece or mask and the reservoir after each use, and allow them to air-dry completely. This information is not intended to replace advice given to you by your health care provider. Make sure you discuss any questions you have with your health care provider. Document Revised: 07/16/2020 Document Reviewed: 12/14/2019 Elsevier Patient Education  [name].

## 2023-01-14 ENCOUNTER — Other Ambulatory Visit: Payer: Self-pay | Admitting: Pediatrics

## 2023-01-20 ENCOUNTER — Ambulatory Visit (INDEPENDENT_AMBULATORY_CARE_PROVIDER_SITE_OTHER): Payer: Medicaid Other | Admitting: Pediatrics

## 2023-01-20 ENCOUNTER — Ambulatory Visit
Admission: RE | Admit: 2023-01-20 | Discharge: 2023-01-20 | Disposition: A | Discharge status: Home / Self Care | Payer: Medicaid Other | Source: Ambulatory Visit | Attending: Pediatrics | Admitting: Pediatrics

## 2023-01-20 VITALS — Wt 97.0 lb

## 2023-01-20 DIAGNOSIS — J189 Pneumonia, unspecified organism: Secondary | ICD-10-CM | POA: Diagnosis not present

## 2023-01-20 DIAGNOSIS — R509 Fever, unspecified: Secondary | ICD-10-CM | POA: Diagnosis not present

## 2023-01-20 DIAGNOSIS — R569 Unspecified convulsions: Secondary | ICD-10-CM

## 2023-01-20 DIAGNOSIS — G808 Other cerebral palsy: Secondary | ICD-10-CM

## 2023-01-20 LAB — POC SOFIA SARS ANTIGEN FIA: SARS Coronavirus 2 Ag: NEGATIVE

## 2023-01-20 LAB — POCT INFLUENZA A: Rapid Influenza A Ag: NEGATIVE

## 2023-01-20 LAB — POCT INFLUENZA B: Rapid Influenza B Ag: NEGATIVE

## 2023-01-20 MED ORDER — AZITHROMYCIN 200 MG/5ML PO SUSR: ORAL | 0 refills | Status: AC

## 2023-01-21 ENCOUNTER — Encounter: Payer: Self-pay | Admitting: Pediatrics

## 2023-01-21 DIAGNOSIS — J189 Pneumonia, unspecified organism: Secondary | ICD-10-CM | POA: Insufficient documentation

## 2023-01-21 DIAGNOSIS — R509 Fever, unspecified: Secondary | ICD-10-CM | POA: Insufficient documentation

## 2023-01-21 DIAGNOSIS — R569 Unspecified convulsions: Secondary | ICD-10-CM | POA: Insufficient documentation

## 2023-01-21 NOTE — Patient Instructions (Signed)
Aspiration Pneumonia, Adult  Aspiration pneumonia is an infection that occurs after you breathe a large amount of food, liquid, stomach acid, or saliva into your lungs (pulmonary aspiration). This can cause inflammation and infection in the lungs. It can also make you cough and make it hard to breathe. This condition is serious and can be life-threatening. What are the causes? This condition may be caused by: Breathing the bacteria in food, liquid, stomach acid, or saliva into your lungs. Breathing material such as blood or a foreign body into your lungs. This can cause an infection even if the material does not originally have bacteria on it. What increases the risk? You are more likely to get aspiration pneumonia if you have a condition that makes it hard to breathe, swallow, cough, or gag, such as: A breathing disorder. This includes chronic obstructive pulmonary disease (COPD). A brain (neurologic) disorder. This may be a stroke, seizures, Parkinson's disease, dementia, amyotrophic lateral sclerosis (ALS), or a brain injury. Gastroesophageal reflux disease (GERD). Esophageal narrowing. This is a narrowing of the tube that carries food to the stomach. You may also be more likely to get this condition if: You are older than age 43 and frail. You are given anesthesia for a procedure. You drink too much alcohol and pass out. If you pass out and vomit, the vomit can be inhaled into your lungs. You take medicines to help you sleep. These include tranquilizers and sedatives. You take poor care of your mouth and teeth. You do not get enough nutrients (are malnourished). You have a weak disease-fighting system (immune system). What are the signs or symptoms? The main sign of this condition is an episode of choking or coughing while eating or drinking. Other symptoms may include: A cough that does not go away. Trouble breathing. This may include shortness of breath. It may also involve  high-pitched whistling sounds when you breathe, most often when you breathe out (wheezing). Fever. Chest pain. Being more tired than usual (fatigue). This condition may also be silent. This means that you may not have any symptoms. How is this diagnosed? This condition may be diagnosed based on a physical exam. You may also have tests, such as: Blood tests. Chest X-ray. Sputum culture. This is when mucus (sputum) is taken from the lungs or from the tubes that carry air to the lungs (bronchi). The sputum is tested for bacteria. Oximetry. This is when a sensor or clip is put on an area such as a finger, earlobe, or toe. It measures the level of oxygen in your blood. Swallowing study. This test looks at how food is swallowed. It is done to see whether food goes into your windpipe (trachea) or esophagus. Bronchoscopy. This test uses a flexible tube (bronchoscope) to see inside your lungs. How is this treated? This condition may be treated with: Medicines. Antibiotics will be given to kill the bacteria that are causing the pneumonia. Other medicines may also be used to reduce fever, pain, or inflammation. Breathing assistance and oxygen therapy. You may need to be given oxygen, or you may need breathing support from a machine (ventilator). Thoracentesis. This is a procedure to remove fluid that has built up in the space between the linings of the chest wall and the lungs. Changes in diet. If you get this condition often, you may need to have a feeding tube placed. This can help give you the nutrients you need. Follow these instructions at home: Medicines Take over-the-counter and prescription medicines as told  by your health care provider. Finish your antibiotics even if you start to feel better. Take cough medicine only if you are losing sleep. Cough medicine can stop your body from being able to remove mucus from your lungs. General instructions Follow instructions from your health care  provider about what you may eat and drink. You may need to: Avoid certain food textures. Thicken your liquids. This can lower your risk of getting this condition again. Sleep in a semi-upright position at night. Try to sleep in a reclining chair. You may also place a few pillows under your head in bed. Do not use any products that contain nicotine or tobacco. These products include cigarettes, chewing tobacco, and vaping devices, such as e-cigarettes. If you need help quitting, ask your health care provider. Return to your normal activities as told by your health care provider. Ask your health care provider what activities are safe for you. Keep all follow-up visits. Your health care provider may need to see how you are healing. Contact a health care provider if: You have a fever. You cough or choke when you eat or drink. You keep having signs or symptoms of this condition. Get help right away if: You have trouble breathing that gets worse. You have chest pain. These symptoms may be an emergency. Get help right away. Call 911. Do not wait to see if symptoms will go away. Do not drive yourself to the hospital. This information is not intended to replace advice given to you by your health care provider. Make sure you discuss any questions you have with your health care provider. Document Revised: 04/16/2022 Document Reviewed: 04/16/2022 Elsevier Patient Education  [employer].

## 2023-01-21 NOTE — Progress Notes (Signed)
Subjective:     History was provided by the mother. Jo Matthews is an 27 y.o. female with complex medical history--seizures/cerebral palsy--global developmental delay and non verbal who presents with an illness characterized  by fever, nonproductive cough, and increased seizures . Symptoms began 2 days ago and there has been little improvement since that time. Associated symptoms include: none. Patient denies chills, dyspnea, weight loss, and wheezing.  Patient has a history of aspiration and complex medical history as above . Current treatments have included albuterol nebulization treatments, with little improvement.   Patient denies having tobacco smoke exposure.  The following portions of the patient's history were reviewed and updated as appropriate: allergies, current medications, past family history, past medical history, past social history, past surgical history, and problem list.  Review of Systems Pertinent items are noted in HPI    Objective:    Wt 97 lb (44 kg)   BMI 19.59 kg/m   Oxygen saturation 97% on 1 liters/min via Patient connected to nasal cannula oxygen General: Baseline developmental delay  without apparent respiratory distress.  Cyanosis: absent  Grunting: absent  Nasal flaring: absent  Retractions: absent  HEENT:  ENT exam normal, no neck nodes or sinus tenderness  Neck: no adenopathy and supple, symmetrical, trachea midline  Lungs: rhonchi LLL  Heart: regular rate and rhythm, S1, S2 normal, no murmur, click, rub or gallop  Extremities:  extremities normal, atraumatic, no cyanosis or edema     Neurological: Baseline delayed development and non verbal    Imaging Heterogeneous airspace opacity of the left lower lobes,concerning for infection/atelectasis     Results for orders placed or performed in visit on 01/20/23 (from the past 72 hour(s))  POCT Influenza A     Status: Normal   Collection Time: 01/20/23 10:44 AM  Result Value Ref Range   Rapid Influenza A  Ag Negative   POCT Influenza B     Status: Normal   Collection Time: 01/20/23 10:44 AM  Result Value Ref Range   Rapid Influenza B Ag Negative   POC SOFIA Antigen FIA     Status: Normal   Collection Time: 01/20/23 10:44 AM  Result Value Ref Range   SARS Coronavirus 2 Ag Negative Negative      Assessment:    Pneumonia in the LLL.    Plan:     All questions answered. Analgesics as needed, doses reviewed. Extra fluids as tolerated. Follow up as needed should symptoms fail to improve. Normal progression of disease discussed. Treatment medications: albuterol nebulization treatments and antibiotics (Zithromax due to allergy to penicillin and cephalosporins). If not improving will add clindamycin or bactrim   ANTIBIOTICS as ordered.

## 2023-05-01 ENCOUNTER — Other Ambulatory Visit: Payer: Self-pay | Admitting: Pediatrics

## 2023-05-05 ENCOUNTER — Telehealth: Payer: Self-pay | Admitting: Pediatrics

## 2023-05-20 ENCOUNTER — Other Ambulatory Visit: Payer: Self-pay | Admitting: Pediatrics

## 2023-06-25 NOTE — Telephone Encounter (Signed)
Meds refilled.

## 2023-09-16 ENCOUNTER — Other Ambulatory Visit: Payer: Self-pay | Admitting: Pediatrics

## 2023-09-16 ENCOUNTER — Telehealth: Payer: Self-pay | Admitting: Pediatrics

## 2023-09-16 NOTE — Telephone Encounter (Signed)
Mother called and stated that the pharmacy needs a prior authorization for [facility] Trillugy machine for breathing treatments to be able to refill the prescription. Mother stated that she has called 2 times now. Will call mother back to update her.

## 2023-09-17 NOTE — Telephone Encounter (Signed)
Mom needs to schedule a face to face for BiPAP machine

## 2023-10-09 ENCOUNTER — Other Ambulatory Visit: Payer: Self-pay | Admitting: Pediatrics

## 2023-10-09 ENCOUNTER — Inpatient Hospital Stay (HOSPITAL_COMMUNITY)
Admission: EM | Admit: 2023-10-09 | Discharge: 2023-10-18 | DRG: 871 | Disposition: A | Discharge status: Home / Self Care | Payer: Medicaid Other | Attending: Internal Medicine | Admitting: Internal Medicine

## 2023-10-09 ENCOUNTER — Emergency Department (HOSPITAL_COMMUNITY): Payer: Medicaid Other

## 2023-10-09 ENCOUNTER — Other Ambulatory Visit: Payer: Self-pay

## 2023-10-09 ENCOUNTER — Ambulatory Visit
Admission: RE | Admit: 2023-10-09 | Discharge: 2023-10-09 | Disposition: A | Discharge status: Home / Self Care | Payer: Medicaid Other | Source: Ambulatory Visit | Attending: Pediatrics | Admitting: Pediatrics

## 2023-10-09 ENCOUNTER — Ambulatory Visit (INDEPENDENT_AMBULATORY_CARE_PROVIDER_SITE_OTHER): Payer: Medicaid Other | Admitting: Pediatrics

## 2023-10-09 ENCOUNTER — Encounter (HOSPITAL_COMMUNITY): Payer: Self-pay

## 2023-10-09 VITALS — Wt 101.0 lb

## 2023-10-09 DIAGNOSIS — J159 Unspecified bacterial pneumonia: Secondary | ICD-10-CM

## 2023-10-09 DIAGNOSIS — J9622 Acute and chronic respiratory failure with hypercapnia: Secondary | ICD-10-CM | POA: Diagnosis present

## 2023-10-09 DIAGNOSIS — Z888 Allergy status to other drugs, medicaments and biological substances status: Secondary | ICD-10-CM

## 2023-10-09 DIAGNOSIS — Z1152 Encounter for screening for COVID-19: Secondary | ICD-10-CM | POA: Diagnosis not present

## 2023-10-09 DIAGNOSIS — J9612 Chronic respiratory failure with hypercapnia: Secondary | ICD-10-CM

## 2023-10-09 DIAGNOSIS — R160 Hepatomegaly, not elsewhere classified: Secondary | ICD-10-CM | POA: Diagnosis present

## 2023-10-09 DIAGNOSIS — J984 Other disorders of lung: Secondary | ICD-10-CM | POA: Diagnosis present

## 2023-10-09 DIAGNOSIS — Z931 Gastrostomy status: Secondary | ICD-10-CM

## 2023-10-09 DIAGNOSIS — J90 Pleural effusion, not elsewhere classified: Secondary | ICD-10-CM | POA: Diagnosis present

## 2023-10-09 DIAGNOSIS — E876 Hypokalemia: Secondary | ICD-10-CM | POA: Diagnosis not present

## 2023-10-09 DIAGNOSIS — J189 Pneumonia, unspecified organism: Principal | ICD-10-CM | POA: Diagnosis present

## 2023-10-09 DIAGNOSIS — I517 Cardiomegaly: Secondary | ICD-10-CM | POA: Diagnosis present

## 2023-10-09 DIAGNOSIS — K760 Fatty (change of) liver, not elsewhere classified: Secondary | ICD-10-CM | POA: Diagnosis present

## 2023-10-09 DIAGNOSIS — F842 Rett's syndrome: Secondary | ICD-10-CM | POA: Diagnosis present

## 2023-10-09 DIAGNOSIS — R569 Unspecified convulsions: Secondary | ICD-10-CM

## 2023-10-09 DIAGNOSIS — Z881 Allergy status to other antibiotic agents status: Secondary | ICD-10-CM

## 2023-10-09 DIAGNOSIS — D649 Anemia, unspecified: Secondary | ICD-10-CM | POA: Diagnosis present

## 2023-10-09 DIAGNOSIS — Z9981 Dependence on supplemental oxygen: Secondary | ICD-10-CM

## 2023-10-09 DIAGNOSIS — G40919 Epilepsy, unspecified, intractable, without status epilepticus: Secondary | ICD-10-CM

## 2023-10-09 DIAGNOSIS — Z151 Genetic susceptibility to epilepsy and neurodevelopmental disorders: Secondary | ICD-10-CM | POA: Diagnosis not present

## 2023-10-09 DIAGNOSIS — Z8701 Personal history of pneumonia (recurrent): Secondary | ICD-10-CM

## 2023-10-09 DIAGNOSIS — T17590A Other foreign object in bronchus causing asphyxiation, initial encounter: Secondary | ICD-10-CM | POA: Diagnosis present

## 2023-10-09 DIAGNOSIS — G808 Other cerebral palsy: Secondary | ICD-10-CM | POA: Diagnosis present

## 2023-10-09 DIAGNOSIS — Z83430 Family history of elevated lipoprotein(a): Secondary | ICD-10-CM

## 2023-10-09 DIAGNOSIS — Z8249 Family history of ischemic heart disease and other diseases of the circulatory system: Secondary | ICD-10-CM

## 2023-10-09 DIAGNOSIS — B9789 Other viral agents as the cause of diseases classified elsewhere: Secondary | ICD-10-CM | POA: Diagnosis present

## 2023-10-09 DIAGNOSIS — Z79899 Other long term (current) drug therapy: Secondary | ICD-10-CM

## 2023-10-09 DIAGNOSIS — R509 Fever, unspecified: Secondary | ICD-10-CM

## 2023-10-09 DIAGNOSIS — W44F9XA Other object of natural or organic material, entering into or through a natural orifice, initial encounter: Secondary | ICD-10-CM | POA: Diagnosis present

## 2023-10-09 DIAGNOSIS — A419 Sepsis, unspecified organism: Secondary | ICD-10-CM | POA: Diagnosis present

## 2023-10-09 DIAGNOSIS — R1310 Dysphagia, unspecified: Secondary | ICD-10-CM | POA: Diagnosis not present

## 2023-10-09 DIAGNOSIS — J9819 Other pulmonary collapse: Secondary | ICD-10-CM | POA: Diagnosis present

## 2023-10-09 DIAGNOSIS — M419 Scoliosis, unspecified: Secondary | ICD-10-CM | POA: Diagnosis present

## 2023-10-09 DIAGNOSIS — Z9889 Other specified postprocedural states: Secondary | ICD-10-CM

## 2023-10-09 DIAGNOSIS — R0902 Hypoxemia: Secondary | ICD-10-CM | POA: Diagnosis present

## 2023-10-09 DIAGNOSIS — Z880 Allergy status to penicillin: Secondary | ICD-10-CM | POA: Diagnosis not present

## 2023-10-09 DIAGNOSIS — G8250 Quadriplegia, unspecified: Secondary | ICD-10-CM

## 2023-10-09 DIAGNOSIS — Z8619 Personal history of other infectious and parasitic diseases: Secondary | ICD-10-CM

## 2023-10-09 DIAGNOSIS — Z993 Dependence on wheelchair: Secondary | ICD-10-CM

## 2023-10-09 DIAGNOSIS — J9811 Atelectasis: Secondary | ICD-10-CM | POA: Diagnosis present

## 2023-10-09 DIAGNOSIS — J9621 Acute and chronic respiratory failure with hypoxia: Secondary | ICD-10-CM | POA: Diagnosis present

## 2023-10-09 DIAGNOSIS — Z833 Family history of diabetes mellitus: Secondary | ICD-10-CM

## 2023-10-09 LAB — COMPREHENSIVE METABOLIC PANEL
ALT: 12 U/L (ref 0–44)
AST: 18 U/L (ref 15–41)
Albumin: 3.1 g/dL — ABNORMAL LOW (ref 3.5–5.0)
Alkaline Phosphatase: 45 U/L (ref 38–126)
Anion gap: 10 (ref 5–15)
BUN: 8 mg/dL (ref 6–20)
CO2: 37 mmol/L — ABNORMAL HIGH (ref 22–32)
Calcium: 9.2 mg/dL (ref 8.9–10.3)
Chloride: 92 mmol/L — ABNORMAL LOW (ref 98–111)
Creatinine, Ser: <0.3 mg/dL — ABNORMAL LOW (ref 0.44–1.00)
Glucose, Bld: 159 mg/dL — ABNORMAL HIGH (ref 70–99)
Potassium: 3.6 mmol/L (ref 3.5–5.1)
Sodium: 139 mmol/L (ref 135–145)
Total Bilirubin: 0.3 mg/dL (ref <1.2)
Total Protein: 6.6 g/dL (ref 6.5–8.1)

## 2023-10-09 LAB — URINALYSIS, ROUTINE W REFLEX MICROSCOPIC
Bilirubin Urine: NEGATIVE
Glucose, UA: NEGATIVE mg/dL
Hgb urine dipstick: NEGATIVE
Ketones, ur: NEGATIVE mg/dL
Leukocytes,Ua: NEGATIVE
Nitrite: NEGATIVE
Protein, ur: NEGATIVE mg/dL
Specific Gravity, Urine: >1.046 — ABNORMAL HIGH (ref 1.005–1.030)
pH: 7 (ref 5.0–8.0)

## 2023-10-09 LAB — CBC
HCT: 32.8 % — ABNORMAL LOW (ref 36.0–46.0)
Hemoglobin: 10.4 g/dL — ABNORMAL LOW (ref 12.0–15.0)
MCH: 34.1 pg — ABNORMAL HIGH (ref 26.0–34.0)
MCHC: 31.7 g/dL (ref 30.0–36.0)
MCV: 107.5 fL — ABNORMAL HIGH (ref 80.0–100.0)
Platelets: 181 10*3/uL (ref 150–400)
RBC: 3.05 MIL/uL — ABNORMAL LOW (ref 3.87–5.11)
RDW: 12.8 % (ref 11.5–15.5)
WBC: 13.3 10*3/uL — ABNORMAL HIGH (ref 4.0–10.5)
nRBC: 0 % (ref 0.0–0.2)

## 2023-10-09 LAB — BLOOD GAS, VENOUS
Acid-Base Excess: 12.8 mmol/L — ABNORMAL HIGH (ref 0.0–2.0)
Bicarbonate: 43.4 mmol/L — ABNORMAL HIGH (ref 20.0–28.0)
O2 Saturation: 86.9 %
Patient temperature: 37
pCO2, Ven: 99 mm[Hg] (ref 44–60)
pH, Ven: 7.25 (ref 7.25–7.43)
pO2, Ven: 59 mm[Hg] — ABNORMAL HIGH (ref 32–45)

## 2023-10-09 LAB — GLUCOSE, CAPILLARY
Glucose-Capillary: 113 mg/dL — ABNORMAL HIGH (ref 70–99)
Glucose-Capillary: 87 mg/dL (ref 70–99)

## 2023-10-09 LAB — I-STAT CG4 LACTIC ACID, ED
Lactic Acid, Venous: 1 mmol/L (ref 0.5–1.9)
Lactic Acid, Venous: 1.4 mmol/L (ref 0.5–1.9)

## 2023-10-09 LAB — RESP PANEL BY RT-PCR (RSV, FLU A&B, COVID)  RVPGX2
Influenza A by PCR: NEGATIVE
Influenza B by PCR: NEGATIVE
Resp Syncytial Virus by PCR: NEGATIVE
SARS Coronavirus 2 by RT PCR: NEGATIVE

## 2023-10-09 MED ORDER — IOHEXOL 350 MG/ML SOLN
75.0000 mL | Freq: Once | INTRAVENOUS | Status: AC | PRN
  Administered 2023-10-09: 75 mL via INTRAVENOUS

## 2023-10-09 MED ORDER — ACETAMINOPHEN 325 MG PO TABS
650.0000 mg | ORAL_TABLET | Freq: Four times a day (QID) | ORAL | Status: DC | PRN
  Administered 2023-10-10 – 2023-10-11 (×4): 650 mg
  Filled 2023-10-09 (×4): qty 2

## 2023-10-09 MED ORDER — SENNOSIDES-DOCUSATE SODIUM 8.6-50 MG PO TABS: 1.0000 | ORAL_TABLET | Freq: Every evening | ORAL | Status: DC | PRN

## 2023-10-09 MED ORDER — ONDANSETRON HCL 4 MG/2ML IJ SOLN: 4.0000 mg | Freq: Four times a day (QID) | INTRAMUSCULAR | Status: DC | PRN

## 2023-10-09 MED ORDER — ACETYLCYSTEINE 20 % IN SOLN: 3.0000 mL | Freq: Three times a day (TID) | RESPIRATORY_TRACT | Status: DC

## 2023-10-09 MED ORDER — ONDANSETRON HCL 4 MG PO TABS: 4.0000 mg | ORAL_TABLET | Freq: Four times a day (QID) | ORAL | Status: DC | PRN

## 2023-10-09 MED ORDER — LEVOFLOXACIN IN D5W 750 MG/150ML IV SOLN
750.0000 mg | Freq: Once | INTRAVENOUS | Status: AC
  Administered 2023-10-09: 750 mg via INTRAVENOUS
  Filled 2023-10-09: qty 150

## 2023-10-09 MED ORDER — SODIUM CHLORIDE 3 % IN NEBU
4.0000 mL | INHALATION_SOLUTION | Freq: Two times a day (BID) | RESPIRATORY_TRACT | Status: AC
  Administered 2023-10-09 – 2023-10-12 (×6): 4 mL via RESPIRATORY_TRACT
  Filled 2023-10-09 (×6): qty 4

## 2023-10-09 MED ORDER — ENOXAPARIN SODIUM 40 MG/0.4ML IJ SOSY
40.0000 mg | PREFILLED_SYRINGE | INTRAMUSCULAR | Status: DC
  Administered 2023-10-09 – 2023-10-17 (×9): 40 mg via SUBCUTANEOUS
  Filled 2023-10-09 (×9): qty 0.4

## 2023-10-09 MED ORDER — IBUPROFEN 100 MG/5ML PO SUSP
400.0000 mg | Freq: Four times a day (QID) | ORAL | Status: AC | PRN
  Administered 2023-10-09: 400 mg
  Filled 2023-10-09 (×2): qty 20

## 2023-10-09 MED ORDER — ACETAMINOPHEN 650 MG RE SUPP: 650.0000 mg | Freq: Four times a day (QID) | RECTAL | Status: DC | PRN

## 2023-10-09 MED ORDER — ACETYLCYSTEINE 20 % IN SOLN
3.0000 mL | Freq: Three times a day (TID) | RESPIRATORY_TRACT | Status: AC
  Administered 2023-10-09 – 2023-10-10 (×4): 3 mL via RESPIRATORY_TRACT
  Filled 2023-10-09 (×4): qty 4

## 2023-10-09 MED ORDER — LEVOFLOXACIN IN D5W 750 MG/150ML IV SOLN
750.0000 mg | INTRAVENOUS | Status: DC
  Administered 2023-10-10 – 2023-10-12 (×3): 750 mg via INTRAVENOUS
  Filled 2023-10-09 (×3): qty 150

## 2023-10-09 MED ORDER — SODIUM CHLORIDE 0.9 % IV BOLUS
1000.0000 mL | Freq: Once | INTRAVENOUS | Status: AC
  Administered 2023-10-09: 1000 mL via INTRAVENOUS

## 2023-10-09 MED ORDER — IBUPROFEN 100 MG/5ML PO SUSP: 400.0000 mg | Freq: Four times a day (QID) | ORAL | Status: DC | PRN

## 2023-10-09 MED ORDER — ACETAMINOPHEN 325 MG PO TABS: 650.0000 mg | ORAL_TABLET | Freq: Four times a day (QID) | ORAL | Status: DC | PRN

## 2023-10-09 MED ORDER — ALBUTEROL SULFATE (2.5 MG/3ML) 0.083% IN NEBU: 2.5000 mg | INHALATION_SOLUTION | RESPIRATORY_TRACT | Status: DC | PRN

## 2023-10-09 MED ORDER — IPRATROPIUM-ALBUTEROL 0.5-2.5 (3) MG/3ML IN SOLN
3.0000 mL | RESPIRATORY_TRACT | Status: DC
  Administered 2023-10-09 – 2023-10-12 (×19): 3 mL via RESPIRATORY_TRACT
  Filled 2023-10-09 (×18): qty 3

## 2023-10-09 NOTE — H&P (Signed)
History and Physical  Jo Matthews ZOX:[id number] DOB: 12-Oct-1996 DOA: 10/09/2023  PCP: Georgiann Hahn, MD   Chief Complaint: Hypoxia  HPI: Jo Matthews is a 27 y.o. female with medical history significant for Rett syndrome, severe scoliosis, seizure disorder, chronic hypoxic respiratory failure typically on 1 L nasal cannula oxygen being admitted to the hospital with acute on chronic hypoxic respiratory failure due to increased plugging and community-acquired pneumonia.  History is provided by the patient's mother who is at the bedside.  They say that she has been in her usual state of health, until the last 24 hours they have noticed that she has been more hypoxic.  She was turning up her oxygen, which is normally at 1 L/min, as high as 8 L/min in order to maintain O2 saturations above 90%.  No significant cough, she did have a fever of 103 yesterday.  No nausea, vomiting, or any other complaints.  States that at baseline she is quite awake and alert and interactive, over the last 24 hours she has been more somnolent and not very interactive.  She had a chest x-ray performed by her PCP this morning, was sent to the ER for further evaluation as the family was told that her left lung was full of something.  Here in the emergency department, she has been maintained on 6 L nasal cannula oxygen, CT this afternoon shows evidence of right basilar consolidation, and complete left-sided atelectasis with left mainstem bronchus mucous plugging.  She was given empiric IV antibiotics, and hospitalist was contacted for admission.  Review of Systems: Please see HPI for pertinent positives and negatives. A complete 10 system review of systems are otherwise negative.  Past Medical History:  Diagnosis Date   Fracture, humerus closed    G tube feedings (HCC)    Pneumonia    Recurrent fever of unknown cause 08/31/2012   Fever to 104-105 lasting 24 hrs, occuring once a week for the past 5 weeks (onset Sept 2013)    Rett's syndrome    Scoliosis    Seizure disorder (HCC)    Seizures (HCC)    Weight loss    Past Surgical History:  Procedure Laterality Date   BACK SURGERY     GASTROSTOMY TUBE PLACEMENT     SPINAL GROWTH RODS     SPINE SURGERY N/A    [employer] 02/14/2021    Social History:  reports that she has never smoked. She has never used smokeless tobacco. She reports that she does not drink alcohol and does not use drugs.   Allergies  Allergen Reactions   Cephalexin Other (See Comments)    seizure  Other Reaction(s): seizure   Clobazam     Severe side effects  Other Reaction(s): Other (See Comments), respiratory distress  Severe side effects, Intolerance   Other    Penicillins Shortness Of Breath    Other Reaction(s): respiratory distress    Family History  Problem Relation Age of Onset   Heart disease Other    Hyperlipidemia Other    Diabetes Other      Prior to Admission medications   Medication Sig Start Date End Date Taking? Authorizing Provider  Calcium Carbonate Antacid (CALCIUM CARBONATE, DOSED IN MG ELEMENTAL CALCIUM,) 1250 MG/5ML SUSP Place 2 mLs (200 mg of elemental calcium total) into feeding tube 3 (three) times daily as needed for indigestion. 05/04/21   Glade Lloyd, MD  cannabidiol (EPIDIOLEX) 100 MG/ML solution Place 350 mg into feeding tube in the morning and at bedtime. 03/11/21  [provider]  cetirizine HCl (ZYRTEC) 5 MG/5ML SOLN TAKE 10 MLS (10 MG TOTAL) BY MOUTH DAILY Patient taking differently: Take 10 mg by mouth daily. 05/20/23   Georgiann Hahn, MD  clonazePAM (KLONOPIN) 0.25 MG disintegrating tablet TAKE 1 TAB BY MOUTH IN MORNING & 2 TABS AT NIGHT WITH 15 EXTRA FOR BREAKTHROUGH SEIZURE FOR 30 DAYS Patient taking differently: Take 0.25-0.5 mg by mouth See admin instructions. TAKE 1 TAB BY MOUTH IN MORNING & 2 TABS AT NIGHT WITH 15 EXTRA FOR BREAKTHROUGH SEIZURE FOR 30 DAYS 09/17/23   Georgiann Hahn, MD  diazepam (DIASAT) 20 MG GEL Place  12.5 mg rectally as needed (for a seizure lasting more than 5 minutes). 09/18/21   [provider]  divalproex (DEPAKOTE SPRINKLE) 125 MG capsule Take 375-500 mg by mouth See admin instructions. Per tube. Takes 3 capsules in the morning and afternoon and 4 capsules at night    [provider]  guaiFENesin (ROBITUSSIN CHEST CONGESTION PO) Place 10 mLs into feeding tube daily as needed (cough).    [provider]  ibuprofen (ADVIL) 100 MG/5ML suspension Place 300 mg into feeding tube every 4 (four) hours as needed for mild pain.    [provider]  levETIRAcetam (KEPPRA) 100 MG/ML solution Place 2,000-2,250 mg into feeding tube See admin instructions. Takes 20 ml in the morning and 22.5 ml in the afternoon 01/08/18   [provider]  mupirocin ointment (BACTROBAN) 2 % APPLY TO AFFECTED AREA 3 TIMES A DAY 11/13/22   Georgiann Hahn, MD  polyethylene glycol powder (GLYCOLAX/MIRALAX) 17 GM/SCOOP powder Place 255 g into feeding tube daily as needed for mild constipation. 05/04/21   Glade Lloyd, MD  sodium chloride 0.9 % nebulizer solution Inhale 3 mLs into the lungs daily as needed for wheezing.    [provider]  lacosamide (VIMPAT) 50 MG TABS tablet Take by mouth. Patient not taking: No sig reported 06/17/18 05/01/21  [provider]    Physical Exam: BP 105/75   Pulse (!) 117   Temp 100.3 F (37.9 C) (Rectal)   Resp (!) 23   Ht 4\' 11"  (1.499 m)   Wt 46 kg   SpO2 98%   BMI 20.48 kg/m   General: Well-nourished female appearing her stated age, her mother and sister are at the bedside.  She is quite somnolent, minimally responsive to tactile and verbal stimulus. Cardiovascular: RRR, no murmurs or rubs, no peripheral edema  Respiratory: Diminished breath sounds on the right, with basilar crackles on anterior exam, absent breath sounds in the left lung zone.  No tachypnea or evidence of respiratory distress.  Wearing 5 L nasal cannula  oxygen. Abdomen: soft, nontender, distended Skin: dry, no rashes  Musculoskeletal: no joint effusions, normal range of motion           Labs on Admission:  Basic Metabolic Panel: Recent Labs  Lab 10/09/23 1320  NA 139  K 3.6  CL 92*  CO2 37*  GLUCOSE 159*  BUN 8  CREATININE <0.30*  CALCIUM 9.2   Liver Function Tests: Recent Labs  Lab 10/09/23 1320  AST 18  ALT 12  ALKPHOS 45  BILITOT 0.3  PROT 6.6  ALBUMIN 3.1*   No results for input(s): "LIPASE", "AMYLASE" in the last 168 hours. No results for input(s): "AMMONIA" in the last 168 hours. CBC: Recent Labs  Lab 10/09/23 1320  WBC 13.3*  HGB 10.4*  HCT 32.8*  MCV 107.5*  PLT 181   Cardiac  Enzymes: No results for input(s): "CKTOTAL", "CKMB", "CKMBINDEX", "TROPONINI" in the last 168 hours.  BNP (last 3 results) No results for input(s): "BNP" in the last 8760 hours.  ProBNP (last 3 results) No results for input(s): "PROBNP" in the last 8760 hours.  CBG: No results for input(s): "GLUCAP" in the last 168 hours.  Radiological Exams on Admission: CT Angio Chest Pulmonary Embolism (PE) W or WO Contrast  Result Date: 10/09/2023 CLINICAL DATA:  Increased oxygen requirement. EXAM: CT ANGIOGRAPHY CHEST WITH CONTRAST TECHNIQUE: Multidetector CT imaging of the chest was performed using the standard protocol during bolus administration of intravenous contrast. Multiplanar CT image reconstructions and MIPs were obtained to evaluate the vascular anatomy. RADIATION DOSE REDUCTION: This exam was performed according to the departmental dose-optimization program which includes automated exposure control, adjustment of the mA and/or kV according to patient size and/or use of iterative reconstruction technique. CONTRAST:  75mL OMNIPAQUE IOHEXOL 350 MG/ML SOLN COMPARISON:  Chest x-ray earlier today. Prior CTA of the chest on 08/05/2017 FINDINGS: Cardiovascular: The pulmonary arteries are adequately opacified. There is no evidence of  pulmonary embolism. Central pulmonary arteries are not dilated. The thoracic aorta is normal in caliber. The heart is mild-to-moderately enlarged. No pericardial fluid present. No visible calcified coronary artery plaque. Mediastinum/Nodes: No enlarged mediastinal, hilar, or axillary lymph nodes. Thyroid gland, trachea, and esophagus demonstrate no significant findings. Lungs/Pleura: New complete atelectasis of the left lung with cut off of the left mainstem bronchus consistent with occlusion, likely related to mucous plug. New airspace disease of the right lower lobe is consistent with pneumonia. No associated pleural effusions. No pneumothorax. The right upper lobe and middle lobe are normally aerated. Upper Abdomen: Hepatomegaly and hepatic steatosis. Musculoskeletal: Extensive spinal fusion hardware again noted. Review of the MIP images confirms the above findings. IMPRESSION: 1. No evidence of pulmonary embolism. 2. New complete atelectasis of the left lung with cut off of the left mainstem bronchus consistent with occlusion, likely related to mucous plug. 3. New airspace disease of the right lower lobe is consistent with pneumonia. 4. Mild-to-moderate cardiomegaly. 5. Hepatomegaly and hepatic steatosis. Electronically Signed   By: Irish Lack M.D.   On: 10/09/2023 16:12   DG Chest 2 View  Result Date: 10/09/2023 CLINICAL DATA:  Fever and cough.  Evaluate for pneumonia. EXAM: CHEST - 2 VIEW COMPARISON:  Chest radiographs 01/20/2023, 05/20/2022, 05/01/2021 FINDINGS: As on the comparison studies, there is moderate hypoinflation. There is worsened now homogeneous opacification of the majority of the left lung. Moderate heterogeneous opacification of the inferior right lung is new from most recent 05/20/2022 and 01/20/2023 comparison radiographs. The cardiac silhouette and mediastinal contours are not well evaluated given the left lung opacities. No definite pneumothorax is seen. Mild dextrocurvature of  the midthoracic spine with extensive thoracic and lumbar posterior rod and screw fusion hardware. Left upper abdominal quadrant gastrostomy tube is noted. IMPRESSION: 1. Worsened now homogeneous opacification of the majority of the left lung. This may represent a combination of pleural effusion, atelectasis, and/or pneumonia. 2. Moderate heterogeneous opacification of the inferior right lung is new from most recent 05/20/2022 and 01/20/2023 comparison radiographs. This also may represent atelectasis and/or pneumonia. Electronically Signed   By: Neita Garnet M.D.   On: 10/09/2023 11:50    Assessment/Plan Jo Matthews is a 27 y.o. female with medical history significant for Rett syndrome, severe scoliosis, seizure disorder, chronic hypoxic respiratory failure typically on 1 L nasal cannula oxygen being admitted to the hospital with acute on  chronic hypoxic respiratory failure due to increased mucus plugging and community-acquired pneumonia.  Sepsis-meeting criteria with tachycardia, leukocytosis, fever at home.  No evidence of significant endorgan dysfunction, lactate is normal.  Source is community-acquired pneumonia. -Inpatient admission -Treat pneumonia as below -Follow-up blood cultures, urinalysis  Acute on chronic hypoxic respiratory failure-likely due to combination of community-acquired pneumonia, but more significantly significant mucous plugging which has caused complete atelectasis of the left lung.  Community-acquired pneumonia -Empiric IV Levaquin given her penicillin and cephalosporin allergy  Mucous plugging-I suspect this is the biggest contributing factor to her acute hypoxia.  This is concerning due to her practically nonexistent cough. -Chest PT -Mucomyst nebs -Discussed with Dr. Everardo All of pulmonary, who will see in consultation but not recommending bronchoscopy currently  DVT prophylaxis: Lovenox     Code Status: Full Code  Consults called: Pulmonology  Admission status:  The appropriate patient status for this patient is INPATIENT. Inpatient status is judged to be reasonable and necessary in order to provide the required intensity of service to ensure the patient's safety. The patient's presenting symptoms, physical exam findings, and initial radiographic and laboratory data in the context of their chronic comorbidities is felt to place them at high risk for further clinical deterioration. Furthermore, it is not anticipated that the patient will be medically stable for discharge from the hospital within 2 midnights of admission.    I certify that at the point of admission it is my clinical judgment that the patient will require inpatient hospital care spanning beyond 2 midnights from the point of admission due to high intensity of service, high risk for further deterioration and high frequency of surveillance required  Time spent: 59 minutes  Lynnwood Beckford Sharlette Dense MD [facility] Pager [phone]  If 7PM-7AM, please contact night-coverage [email or web address] Password [facility]  10/09/2023, 5:14 PM

## 2023-10-09 NOTE — ED Provider Notes (Signed)
[place] EMERGENCY DEPARTMENT AT [facility] Provider Note   CSN: [id number] Arrival date & time: 10/09/23  1209     History  Chief Complaint  Patient presents with   Shortness of Breath         Jo Matthews is a 27 y.o. 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 Patient had fever of 103F, brought down with Ibuprofen this morning.  Patient was seen by primary care this morning who ordered chest x-ray suggesting possible pneumonia and was sent here. No recent URI symptoms. No recent medications changes. Denies other associated symptoms.     Shortness of Breath      Home Medications Prior to Admission medications   Medication Sig Start Date End Date Taking? Authorizing Provider  Calcium Carbonate Antacid (CALCIUM CARBONATE, DOSED IN MG ELEMENTAL CALCIUM,) 1250 MG/5ML SUSP Place 2 mLs (200 mg of elemental calcium total) into feeding tube 3 (three) times daily as needed for indigestion. 05/04/21   Glade Lloyd, MD  cannabidiol (EPIDIOLEX) 100 MG/ML solution Place 350 mg into feeding tube in the morning and at bedtime. 03/11/21   [provider]  cetirizine HCl (ZYRTEC) 5 MG/5ML SOLN TAKE 10 MLS (10 MG TOTAL) BY MOUTH DAILY 05/20/23   Ramgoolam, Emeline Gins, MD  clonazePAM (KLONOPIN) 0.25 MG disintegrating tablet TAKE 1 TAB BY MOUTH IN MORNING & 2 TABS AT NIGHT WITH 15 EXTRA FOR BREAKTHROUGH SEIZURE FOR 30 DAYS 09/17/23   Georgiann Hahn, MD  diazepam (DIASAT) 20 MG GEL SMARTSIG:12.5 Milligram(s) Rectally PRN 09/18/21   [provider]  diazepam (DIASTAT ACUDIAL) 10 MG GEL Place 12.5 mg rectally daily as needed for seizure (seizure lasting more than 5 minutes).    [provider]  divalproex (DEPAKOTE SPRINKLE) 125 MG capsule Take 375-500 mg by mouth See admin instructions.  Per tube. Takes 3 capsules in the morning and afternoon and 4 capsules at night    [provider]  guaiFENesin (ROBITUSSIN CHEST CONGESTION PO) Place 10 mLs into feeding tube daily as needed (cough).    [provider]  ibuprofen (ADVIL) 100 MG/5ML suspension Place 300 mg into feeding tube every 4 (four) hours as needed for mild pain.    [provider]  levETIRAcetam (KEPPRA) 100 MG/ML solution Place 2,000-2,250 mg into feeding tube See admin instructions. Takes 20 ml in the morning and 22.5 ml in the afternoon 01/08/18   [provider]  mupirocin ointment (BACTROBAN) 2 % APPLY TO AFFECTED AREA 3 TIMES A DAY 11/13/22   Georgiann Hahn, MD  polyethylene glycol powder (GLYCOLAX/MIRALAX) 17 GM/SCOOP powder Place 255 g into feeding tube daily as needed for mild constipation. 05/04/21   Glade Lloyd, MD  sodium chloride 0.9 % nebulizer solution Inhale 3 mLs into the lungs daily as needed for wheezing.    [provider]  lacosamide (VIMPAT) 50 MG TABS tablet Take by mouth. Patient not taking: No sig reported 06/17/18 05/01/21  [provider]      Allergies    Cephalexin, Onfi [clobazam], Other, and Penicillins    Review of Systems   Review of Systems  Respiratory:  Positive for shortness of breath.     Physical Exam Updated Vital Signs BP 119/67   Pulse 99   Temp (!) 97.3 F (36.3 C) (Oral)   Resp 16   Wt 45.8 kg  SpO2 100%   BMI 20.40 kg/m  Physical Exam Vitals and nursing note reviewed.  Constitutional:      General: She is not in acute distress.    Appearance: She is ill-appearing. She is not toxic-appearing.  HENT:     Head: Normocephalic and atraumatic.     Nose: Congestion present.  Eyes:     General: No scleral icterus.    Conjunctiva/sclera: Conjunctivae normal.  Cardiovascular:     Rate and Rhythm: Regular rhythm. Tachycardia present.     Pulses: Normal pulses.     Heart sounds: Normal heart sounds.  Pulmonary:      Effort: Pulmonary effort is normal. No respiratory distress.     Breath sounds: Normal breath sounds.     Comments: Decreased breath sounds throughout lung fields.  Abdominal:     General: Abdomen is flat. Bowel sounds are normal. There is no distension.     Palpations: Abdomen is soft.     Tenderness: There is no abdominal tenderness. There is no guarding.  Musculoskeletal:     Right lower leg: No edema.     Left lower leg: No edema.  Skin:    General: Skin is warm and dry.     Findings: No lesion.  Neurological:     General: No focal deficit present.     Mental Status: She is alert and oriented to person, place, and time. Mental status is at baseline.     ED Results / Procedures / Treatments   Labs (all labs ordered are listed, but only abnormal results are displayed) Labs Reviewed - No data to display  EKG None  Radiology CT Angio Chest Pulmonary Embolism (PE) W or WO Contrast  Result Date: 10/09/2023 CLINICAL DATA:  Increased oxygen requirement. EXAM: CT ANGIOGRAPHY CHEST WITH CONTRAST TECHNIQUE: Multidetector CT imaging of the chest was performed using the standard protocol during bolus administration of intravenous contrast. Multiplanar CT image reconstructions and MIPs were obtained to evaluate the vascular anatomy. RADIATION DOSE REDUCTION: This exam was performed according to the departmental dose-optimization program which includes automated exposure control, adjustment of the mA and/or kV according to patient size and/or use of iterative reconstruction technique. CONTRAST:  75mL OMNIPAQUE IOHEXOL 350 MG/ML SOLN COMPARISON:  Chest x-ray earlier today. Prior CTA of the chest on 08/05/2017 FINDINGS: Cardiovascular: The pulmonary arteries are adequately opacified. There is no evidence of pulmonary embolism. Central pulmonary arteries are not dilated. The thoracic aorta is normal in caliber. The heart is mild-to-moderately enlarged. No pericardial fluid present. No visible  calcified coronary artery plaque. Mediastinum/Nodes: No enlarged mediastinal, hilar, or axillary lymph nodes. Thyroid gland, trachea, and esophagus demonstrate no significant findings. Lungs/Pleura: New complete atelectasis of the left lung with cut off of the left mainstem bronchus consistent with occlusion, likely related to mucous plug. New airspace disease of the right lower lobe is consistent with pneumonia. No associated pleural effusions. No pneumothorax. The right upper lobe and middle lobe are normally aerated. Upper Abdomen: Hepatomegaly and hepatic steatosis. Musculoskeletal: Extensive spinal fusion hardware again noted. Review of the MIP images confirms the above findings. IMPRESSION: 1. No evidence of pulmonary embolism. 2. New complete atelectasis of the left lung with cut off of the left mainstem bronchus consistent with occlusion, likely related to mucous plug. 3. New airspace disease of the right lower lobe is consistent with pneumonia. 4. Mild-to-moderate cardiomegaly. 5. Hepatomegaly and hepatic steatosis. Electronically Signed   By: Irish Lack M.D.   On: 10/09/2023 16:12  DG Chest 2 View  Result Date: 10/09/2023 CLINICAL DATA:  Fever and cough.  Evaluate for pneumonia. EXAM: CHEST - 2 VIEW COMPARISON:  Chest radiographs 01/20/2023, 05/20/2022, 05/01/2021 FINDINGS: As on the comparison studies, there is moderate hypoinflation. There is worsened now homogeneous opacification of the majority of the left lung. Moderate heterogeneous opacification of the inferior right lung is new from most recent 05/20/2022 and 01/20/2023 comparison radiographs. The cardiac silhouette and mediastinal contours are not well evaluated given the left lung opacities. No definite pneumothorax is seen. Mild dextrocurvature of the midthoracic spine with extensive thoracic and lumbar posterior rod and screw fusion hardware. Left upper abdominal quadrant gastrostomy tube is noted. IMPRESSION: 1. Worsened now  homogeneous opacification of the majority of the left lung. This may represent a combination of pleural effusion, atelectasis, and/or pneumonia. 2. Moderate heterogeneous opacification of the inferior right lung is new from most recent 05/20/2022 and 01/20/2023 comparison radiographs. This also may represent atelectasis and/or pneumonia. Electronically Signed   By: Neita Garnet M.D.   On: 10/09/2023 11:50    Procedures Procedures    Medications Ordered in ED Medications - No data to display  ED Course/ Medical Decision Making/ A&P                                 Medical Decision Making Amount and/or Complexity of Data Reviewed Labs: ordered. Radiology: ordered.  Risk Prescription drug management. Decision regarding hospitalization.   Allayne Butcher 27 y.o. presented today for shortness of breath.  Working DDx that I considered at this time includes, but not limited to, asthma/COPD exacerbation, URI, viral illness, anemia, ACS, PE, pneumonia, pleural effusion, lung mass.  R/o DDx: These are considered less likely due to history of present illness, physical exam, labs/imaging findings  Pmhx: Retts Syndrome, Seizure disorder, hypoxia on 0.5-1L at baseline, G tube, cerebral palsy  Review of prior external notes: 10/09/23  Unique Tests and My Interpretation:  CBC: Leukocytosis of 13.3, hemoglobin 10.4 CMP: CMP without electrolyte abnormality Blood gas: CO2 99, PaO2 59 Blood cultures: Pending Lactic: 1.0  CXR: ordered by PCP showing worsening opacification of left lung representing pleural effusion, atelectasis or pneumonia with new opacification of right lung.  CTA Chest PE: No evidence of PE, New complete atelectasis of the left lung with cut off of the left mainstem bronchus consistent with occlusion, likely related to mucous plug.   Respiratory Panel: NEG  Consult: hospitalist who agree to admit, recommending consult with pulm.  I spoke with pulmonology regarding patient who  recommended chest physio and nebulizers, relayed this information to hospitalist.  Problem List / ED Course / Critical interventions / Medication management  Patient reporting to emergency room with symptoms concerning for pneumonia.  Patient hemodynamically stable on arrival, does not currently have a fever and not currently tachycardic.  Patient has obvious decreased breath sounds on exam, chest x-ray with primary care concerning for pleural effusion versus pneumonia.  PE study shows bilateral pneumonia and left mainstem bronchus occlusion consistent with mucous plug.  Spoke with hospitalist who agreed to admit recommending speaking with pulmonology.  I called pulmonology who recommended chest physiotherapy and nebs.  I ordered medication including IV antibiotics Reevaluation of the patient after these medicines showed that the patient stayed the same Patients vitals assessed. Upon arrival patient is hemodynamically stable.  I have reviewed the patients home medicines and have made adjustments as needed    Plan:  Admit for pneumonia, IV antibiotics & hypoxia          Final Clinical Impression(s) / ED Diagnoses Final diagnoses:  Pneumonia of both lungs due to infectious organism, unspecified part of lung  Hypoxia    Rx / DC Orders ED Discharge Orders     None         Smitty Knudsen, PA-C 10/09/23 2130    Elayne Snare K, DO 10/10/23 [phone]

## 2023-10-09 NOTE — Addendum Note (Signed)
Addended by: Wyvonnia Lora on: 10/09/2023 08:57 AM   Modules accepted: Orders

## 2023-10-09 NOTE — ED Triage Notes (Signed)
Pt referred to the ER by her PCP. Family states that she has increased oxygen demand, had x-ray performed by her PCP and was told that "her lungs are full". Caregiver unable to explain what her lungs were full of. Caregiver also says that her vitals have been off from their normal.

## 2023-10-09 NOTE — Consult Note (Signed)
NAME:  Jo Matthews, MRN:  [id number], DOB:  14-Apr-1996, LOS: 0 ADMISSION DATE:  10/09/2023, CONSULTATION DATE:  10/09/2023 REFERRING MD:  Dr. Theresia Lo - EDP, CHIEF COMPLAINT:  Mucus plug    History of Present Illness:  Jo Matthews is a 27 y.o. female with an extensive PMH significant  for Retts Syndrome, seizure disorder, cerebral palsy, and chronic hypoxic respiratory failure on 0.5-1L [place] at baseline who presented to the ED 11/22 for worsening hypoxia, cough, SOB, and fatigue. Patient was seen by PCP who obtained a CXR that was concerning for PNA and prompted family to present to the ED. On ED arrival patient was seen with mild tachycardia with all other vitals WNL on 6L [place]  Patient was admitted per [facility] for management of CAP   CTA chest obtained and negative for PE but positive for right sisded PNA and near complete collapse of left in the setting of mucus plug. PCCM consulted for assistance in management.    Pertinent  Medical History   Retts Syndrome, seizure disorder, cerebral palsy, and chronic hypoxic respiratory failure on 0.5-1L [place] at baseline  Significant Hospital Events: Including procedures, antibiotic start and stop dates in addition to other pertinent events   11/22 Presented at PCP recommendation for management of CAP. CTA chest obtained and negative for PE but positive for right sisded PNA and near complete collapse of left in the setting of mucus plug.   Interim History / Subjective:  As above  Objective   Blood pressure 105/75, pulse (!) 108, temperature (!) 97.3 F (36.3 C), temperature source Oral, resp. rate (!) 23, height 4\' 11"  (1.499 m), weight 46 kg, SpO2 99%.       No intake or output data in the 24 hours ending 10/09/23 1702 Filed Weights   10/09/23 1217 10/09/23 1438  Weight: 45.8 kg 46 kg    Examination: General: Acute on chronically ill appearing deconditioned adult lying in bed, in NAD HEENT: [place]/AT, MM pink/moist, PERRL,  Neuro: Will flicker eyes open  to verbal stimuli  CV: s1s2 regular rate and rhythm, no murmur, rubs, or gallops,  PULM:  Severely diminished left side, slight rhonchi to right, no increased work of breathing, on 4L [place] with sats 100% GI: soft, bowel sounds active in all 4 quadrants, non-tender, non-distended Extremities: warm/dry, no edema  Skin: no rashes or lesions  Resolved Hospital Problem list     Assessment & Plan:  Acute on chronic hypoxic and hypercapnic respiratory failure  -At baseline uses 0.5-1L [place], currently on 5L Community acquired pneumonia present on admission Left mucus plug in the setting of multiple musculoskeletal dysfunctions -CTA chest obtained and negative for PE but positive for right sisded PNA and near complete collapse of left in the setting of mucus plug.  P: Continue supplemental oxygen as needed for sats greater than 92% Head of bed elevated 30 degrees As needed BIPAP Follow intermittent chest x-ray and ABG as needed  Ensure adequate pulmonary hygiene  Follow cultures  Hypertonic saline nebs Scheduled bronchodilators  Chest PT when awake  NT suctioning as needed  Aspiration precautions    Best Practice (right click and "Reselect all SmartList Selections" daily)  Per primary   Labs   CBC: Recent Labs  Lab 10/09/23 1320  WBC 13.3*  HGB 10.4*  HCT 32.8*  MCV 107.5*  PLT 181    Basic Metabolic Panel: Recent Labs  Lab 10/09/23 1320  NA 139  K 3.6  CL 92*  CO2 37*  GLUCOSE 159*  BUN 8  CREATININE <0.30*  CALCIUM 9.2   GFR: CrCl cannot be calculated (This lab value cannot be used to calculate CrCl because it is not a number: <0.30). Recent Labs  Lab 10/09/23 1320 10/09/23 1338 10/09/23 1524  WBC 13.3*  --   --   LATICACIDVEN  --  1.0 1.4    Liver Function Tests: Recent Labs  Lab 10/09/23 1320  AST 18  ALT 12  ALKPHOS 45  BILITOT 0.3  PROT 6.6  ALBUMIN 3.1*   No results for input(s): "LIPASE", "AMYLASE" in the last 168 hours. No results for  input(s): "AMMONIA" in the last 168 hours.  ABG    Component Value Date/Time   PHART 7.475 (H) 12/04/2014 1154   PCO2ART 40.1 12/04/2014 1154   PO2ART 268.0 (H) 12/04/2014 1154   HCO3 43.4 (H) 10/09/2023 1320   TCO2 47 (H) 05/01/2021 0920   O2SAT 86.9 10/09/2023 1320     Coagulation Profile: No results for input(s): "INR", "PROTIME" in the last 168 hours.  Cardiac Enzymes: No results for input(s): "CKTOTAL", "CKMB", "CKMBINDEX", "TROPONINI" in the last 168 hours.  HbA1C: No results found for: "HGBA1C"  CBG: No results for input(s): "GLUCAP" in the last 168 hours.  Review of Systems:   Please see the history of present illness. All other systems reviewed and are negative   Past Medical History:  She,  has a past medical history of Fracture, humerus closed, G tube feedings (HCC), Pneumonia, Recurrent fever of unknown cause (08/31/2012), Rett's syndrome, Scoliosis, Seizure disorder (HCC), Seizures (HCC), and Weight loss.   Surgical History:   Past Surgical History:  Procedure Laterality Date   BACK SURGERY     GASTROSTOMY TUBE PLACEMENT     SPINAL GROWTH RODS     SPINE SURGERY N/A    [employer] 02/14/2021     Social History:   reports that she has never smoked. She has never used smokeless tobacco. She reports that she does not drink alcohol and does not use drugs.   Family History:  Her family history includes Diabetes in an other family member; Heart disease in an other family member; Hyperlipidemia in an other family member.   Allergies Allergies  Allergen Reactions   Cephalexin Other (See Comments)    seizure  Other Reaction(s): seizure   Clobazam     Severe side effects  Other Reaction(s): Other (See Comments), respiratory distress  Severe side effects, Intolerance   Other    Penicillins Shortness Of Breath    Other Reaction(s): respiratory distress     Home Medications  Prior to Admission medications   Medication Sig Start Date End Date Taking?  Authorizing Provider  Calcium Carbonate Antacid (CALCIUM CARBONATE, DOSED IN MG ELEMENTAL CALCIUM,) 1250 MG/5ML SUSP Place 2 mLs (200 mg of elemental calcium total) into feeding tube 3 (three) times daily as needed for indigestion. 05/04/21   Glade Lloyd, MD  cannabidiol (EPIDIOLEX) 100 MG/ML solution Place 350 mg into feeding tube in the morning and at bedtime. 03/11/21   [provider]  cetirizine HCl (ZYRTEC) 5 MG/5ML SOLN TAKE 10 MLS (10 MG TOTAL) BY MOUTH DAILY Patient taking differently: Take 10 mg by mouth daily. 05/20/23   Georgiann Hahn, MD  clonazePAM (KLONOPIN) 0.25 MG disintegrating tablet TAKE 1 TAB BY MOUTH IN MORNING & 2 TABS AT NIGHT WITH 15 EXTRA FOR BREAKTHROUGH SEIZURE FOR 30 DAYS Patient taking differently: Take 0.25-0.5 mg by mouth See admin instructions. TAKE 1 TAB BY MOUTH  IN MORNING & 2 TABS AT NIGHT WITH 15 EXTRA FOR BREAKTHROUGH SEIZURE FOR 30 DAYS 09/17/23   Georgiann Hahn, MD  diazepam (DIASAT) 20 MG GEL Place 12.5 mg rectally as needed (for a seizure lasting more than 5 minutes). 09/18/21   [provider]  divalproex (DEPAKOTE SPRINKLE) 125 MG capsule Take 375-500 mg by mouth See admin instructions. Per tube. Takes 3 capsules in the morning and afternoon and 4 capsules at night    [provider]  guaiFENesin (ROBITUSSIN CHEST CONGESTION PO) Place 10 mLs into feeding tube daily as needed (cough).    [provider]  ibuprofen (ADVIL) 100 MG/5ML suspension Place 300 mg into feeding tube every 4 (four) hours as needed for mild pain.    [provider]  levETIRAcetam (KEPPRA) 100 MG/ML solution Place 2,000-2,250 mg into feeding tube See admin instructions. Takes 20 ml in the morning and 22.5 ml in the afternoon 01/08/18   [provider]  mupirocin ointment (BACTROBAN) 2 % APPLY TO AFFECTED AREA 3 TIMES A DAY 11/13/22   Georgiann Hahn, MD  polyethylene glycol powder (GLYCOLAX/MIRALAX) 17 GM/SCOOP powder Place 255 g  into feeding tube daily as needed for mild constipation. 05/04/21   Glade Lloyd, MD  sodium chloride 0.9 % nebulizer solution Inhale 3 mLs into the lungs daily as needed for wheezing.    [provider]  lacosamide (VIMPAT) 50 MG TABS tablet Take by mouth. Patient not taking: No sig reported 06/17/18 05/01/21  [provider]     Critical care time: NA  Jaselle Pryer D. Harris, NP-C [facility] Personal contact information can be found on Amion  If no contact or response made please call [phone] 10/09/2023, 5:14 PM

## 2023-10-09 NOTE — ED Provider Notes (Incomplete)
[place] EMERGENCY DEPARTMENT AT [facility] Provider Note   CSN: [id number] Arrival date & time: 10/09/23  1209     History {Add pertinent medical, surgical, social history, OB history to HPI:1} Chief Complaint  Patient presents with  . Shortness of Breath         Jo Matthews is a 27 y.o. 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 Patient had fever of 103F, brought down with Ibuprofen this morning.  Patient was seen by primary care this morning who ordered chest x-ray suggesting possible pneumonia and was sent here. No recent URI symptoms. No recent medications changes. Denies other associated symptoms.     Shortness of Breath      Home Medications Prior to Admission medications   Medication Sig Start Date End Date Taking? Authorizing Provider  Calcium Carbonate Antacid (CALCIUM CARBONATE, DOSED IN MG ELEMENTAL CALCIUM,) 1250 MG/5ML SUSP Place 2 mLs (200 mg of elemental calcium total) into feeding tube 3 (three) times daily as needed for indigestion. 05/04/21   Glade Lloyd, MD  cannabidiol (EPIDIOLEX) 100 MG/ML solution Place 350 mg into feeding tube in the morning and at bedtime. 03/11/21   [provider]  cetirizine HCl (ZYRTEC) 5 MG/5ML SOLN TAKE 10 MLS (10 MG TOTAL) BY MOUTH DAILY 05/20/23   Ramgoolam, Emeline Gins, MD  clonazePAM (KLONOPIN) 0.25 MG disintegrating tablet TAKE 1 TAB BY MOUTH IN MORNING & 2 TABS AT NIGHT WITH 15 EXTRA FOR BREAKTHROUGH SEIZURE FOR 30 DAYS 09/17/23   Georgiann Hahn, MD  diazepam (DIASAT) 20 MG GEL SMARTSIG:12.5 Milligram(s) Rectally PRN 09/18/21   [provider]  diazepam (DIASTAT ACUDIAL) 10 MG GEL Place 12.5 mg rectally daily as needed for seizure (seizure lasting more than 5 minutes).    [provider]  divalproex (DEPAKOTE  SPRINKLE) 125 MG capsule Take 375-500 mg by mouth See admin instructions. Per tube. Takes 3 capsules in the morning and afternoon and 4 capsules at night    [provider]  guaiFENesin (ROBITUSSIN CHEST CONGESTION PO) Place 10 mLs into feeding tube daily as needed (cough).    [provider]  ibuprofen (ADVIL) 100 MG/5ML suspension Place 300 mg into feeding tube every 4 (four) hours as needed for mild pain.    [provider]  levETIRAcetam (KEPPRA) 100 MG/ML solution Place 2,000-2,250 mg into feeding tube See admin instructions. Takes 20 ml in the morning and 22.5 ml in the afternoon 01/08/18   [provider]  mupirocin ointment (BACTROBAN) 2 % APPLY TO AFFECTED AREA 3 TIMES A DAY 11/13/22   Georgiann Hahn, MD  polyethylene glycol powder (GLYCOLAX/MIRALAX) 17 GM/SCOOP powder Place 255 g into feeding tube daily as needed for mild constipation. 05/04/21   Glade Lloyd, MD  sodium chloride 0.9 % nebulizer solution Inhale 3 mLs into the lungs daily as needed for wheezing.    [provider]  lacosamide (VIMPAT) 50 MG TABS tablet Take by mouth. Patient not taking: No sig reported 06/17/18 05/01/21  [provider]      Allergies    Cephalexin, Onfi [clobazam], Other, and Penicillins    Review of Systems   Review of Systems  Respiratory:  Positive for shortness of breath.     Physical Exam Updated Vital Signs BP 119/67   Pulse 99   Temp (!) 97.3 F (36.3 C) (Oral)  Resp 16   Wt 45.8 kg   SpO2 100%   BMI 20.40 kg/m  Physical Exam Vitals and nursing note reviewed.  Constitutional:      General: She is not in acute distress.    Appearance: She is not toxic-appearing.  HENT:     Head: Normocephalic and atraumatic.     Nose: Congestion present.  Eyes:     General: No scleral icterus.    Conjunctiva/sclera: Conjunctivae normal.  Cardiovascular:     Rate and Rhythm: Regular rhythm. Tachycardia present.     Pulses: Normal pulses.      Heart sounds: Normal heart sounds.  Pulmonary:     Effort: Pulmonary effort is normal. No respiratory distress.     Breath sounds: Normal breath sounds.     Comments: Decreased breath sounds throughout lung fields.  Abdominal:     General: Abdomen is flat. Bowel sounds are normal. There is no distension.     Palpations: Abdomen is soft.     Tenderness: There is no abdominal tenderness. There is no guarding.  Musculoskeletal:     Right lower leg: No edema.     Left lower leg: No edema.  Skin:    General: Skin is warm and dry.     Findings: No lesion.  Neurological:     General: No focal deficit present.     Mental Status: She is alert and oriented to person, place, and time. Mental status is at baseline.     ED Results / Procedures / Treatments   Labs (all labs ordered are listed, but only abnormal results are displayed) Labs Reviewed - No data to display  EKG None  Radiology CT Angio Chest Pulmonary Embolism (PE) W or WO Contrast  Result Date: 10/09/2023 CLINICAL DATA:  Increased oxygen requirement. EXAM: CT ANGIOGRAPHY CHEST WITH CONTRAST TECHNIQUE: Multidetector CT imaging of the chest was performed using the standard protocol during bolus administration of intravenous contrast. Multiplanar CT image reconstructions and MIPs were obtained to evaluate the vascular anatomy. RADIATION DOSE REDUCTION: This exam was performed according to the departmental dose-optimization program which includes automated exposure control, adjustment of the mA and/or kV according to patient size and/or use of iterative reconstruction technique. CONTRAST:  75mL OMNIPAQUE IOHEXOL 350 MG/ML SOLN COMPARISON:  Chest x-ray earlier today. Prior CTA of the chest on 08/05/2017 FINDINGS: Cardiovascular: The pulmonary arteries are adequately opacified. There is no evidence of pulmonary embolism. Central pulmonary arteries are not dilated. The thoracic aorta is normal in caliber. The heart is mild-to-moderately  enlarged. No pericardial fluid present. No visible calcified coronary artery plaque. Mediastinum/Nodes: No enlarged mediastinal, hilar, or axillary lymph nodes. Thyroid gland, trachea, and esophagus demonstrate no significant findings. Lungs/Pleura: New complete atelectasis of the left lung with cut off of the left mainstem bronchus consistent with occlusion, likely related to mucous plug. New airspace disease of the right lower lobe is consistent with pneumonia. No associated pleural effusions. No pneumothorax. The right upper lobe and middle lobe are normally aerated. Upper Abdomen: Hepatomegaly and hepatic steatosis. Musculoskeletal: Extensive spinal fusion hardware again noted. Review of the MIP images confirms the above findings. IMPRESSION: 1. No evidence of pulmonary embolism. 2. New complete atelectasis of the left lung with cut off of the left mainstem bronchus consistent with occlusion, likely related to mucous plug. 3. New airspace disease of the right lower lobe is consistent with pneumonia. 4. Mild-to-moderate cardiomegaly. 5. Hepatomegaly and hepatic steatosis. Electronically Signed   By: Rudene Anda.D.  On: 10/09/2023 16:12   DG Chest 2 View  Result Date: 10/09/2023 CLINICAL DATA:  Fever and cough.  Evaluate for pneumonia. EXAM: CHEST - 2 VIEW COMPARISON:  Chest radiographs 01/20/2023, 05/20/2022, 05/01/2021 FINDINGS: As on the comparison studies, there is moderate hypoinflation. There is worsened now homogeneous opacification of the majority of the left lung. Moderate heterogeneous opacification of the inferior right lung is new from most recent 05/20/2022 and 01/20/2023 comparison radiographs. The cardiac silhouette and mediastinal contours are not well evaluated given the left lung opacities. No definite pneumothorax is seen. Mild dextrocurvature of the midthoracic spine with extensive thoracic and lumbar posterior rod and screw fusion hardware. Left upper abdominal quadrant gastrostomy  tube is noted. IMPRESSION: 1. Worsened now homogeneous opacification of the majority of the left lung. This may represent a combination of pleural effusion, atelectasis, and/or pneumonia. 2. Moderate heterogeneous opacification of the inferior right lung is new from most recent 05/20/2022 and 01/20/2023 comparison radiographs. This also may represent atelectasis and/or pneumonia. Electronically Signed   By: Neita Garnet M.D.   On: 10/09/2023 11:50    Procedures Procedures  {Document cardiac monitor, telemetry assessment procedure when appropriate:1}  Medications Ordered in ED Medications - No data to display  ED Course/ Medical Decision Making/ A&P   {   Click here for ABCD2, HEART and other calculatorsREFRESH Note before signing :1}                              Medical Decision Making Amount and/or Complexity of Data Reviewed Labs: ordered. Radiology: ordered.  Risk Prescription drug management. Decision regarding hospitalization.   Allayne Butcher 27 y.o. presented today for shortness of breath.  Working DDx that I considered at this time includes, but not limited to, asthma/COPD exacerbation, URI, viral illness, anemia, ACS, PE, pneumonia, pleural effusion, lung mass.  R/o DDx: These are considered less likely due to history of present illness, physical exam, labs/imaging findings  Pmhx: Retts Syndrome, Seizure disorder, hypoxia on 0.5-1L at baseline, G tube, cerebral palsy  Review of prior external notes: ***  Unique Tests and My Interpretation:  CBC: *** CMP: *** Blood gas: Blood cultures: Lactic: 1.0 EKG: *** CXR: ordered by PCP showing worsening opacification of left lung representing pleural effusion, atelectasis or pneumonia with new opacification of right lung.  CTA Chest PE: *** Respiratory Panel: NEG    Consult: hospitalist who agree to admit, recommending consult with pulm. I will place consult to pulm who recommend chest physio and nebs for mucous plug and  agree to see patient.   Problem List / ED Course / Critical interventions / Medication management  *** I ordered medication including ***  for ***  Reevaluation of the patient after these medicines showed that the patient {resolved/improved/worsened:23923::"improved"} Patients vitals assessed. Upon arrival patient is hemodynamically stable.  I have reviewed the patients home medicines and have made adjustments as needed    Plan: Admit    {Document critical care time when appropriate:1} {Document review of labs and clinical decision tools ie heart score, Chads2Vasc2 etc:1}  {Document your independent review of radiology images, and any outside records:1} {Document your discussion with family members, caretakers, and with consultants:1} {Document social determinants of health affecting pt's care:1} {Document your decision making why or why not admission, treatments were needed:1} Final Clinical Impression(s) / ED Diagnoses Final diagnoses:  Pneumonia of both lungs due to infectious organism, unspecified part of lung  Hypoxia  Rx / DC Orders ED Discharge Orders     None

## 2023-10-09 NOTE — ED Notes (Signed)
ED TO INPATIENT HANDOFF REPORT  ED Nurse Name and Phone #: Linus Orn Name/Age/Gender Jo Matthews 27 y.o. female Room/Bed: WA13/WA13  Code Status   Code Status: Full Code  Home/SNF/Other Home Patient oriented to: self Is this baseline? Yes   Triage Complete: Triage complete  Chief Complaint Acute on chronic hypoxic respiratory failure [facility] [J96.21]  Triage Note Pt referred to the ER by her PCP. Family states that she has increased oxygen demand, had x-ray performed by her PCP and was told that "her lungs are full". Caregiver unable to explain what her lungs were full of. Caregiver also says that her vitals have been off from their normal.    Allergies Allergies  Allergen Reactions   Cephalexin Other (See Comments)    seizure  Other Reaction(s): seizure   Clobazam     Severe side effects  Other Reaction(s): Other (See Comments), respiratory distress  Severe side effects, Intolerance   Other    Penicillins Shortness Of Breath    Other Reaction(s): respiratory distress    Level of Care/Admitting Diagnosis ED Disposition     ED Disposition  Admit   Condition  --   Comment  Hospital Area: [facility] [[id number]]  Level of Care: Stepdown [14]  Admit to SDU based on following criteria: Respiratory Distress:  Frequent assessment and/or intervention to maintain adequate ventilation/respiration, pulmonary toilet, and respiratory treatment.  May admit patient to [facility] or [facility] if equivalent level of care is available:: Yes  Covid Evaluation: Confirmed COVID Negative  Diagnosis: Acute on chronic hypoxic respiratory failure [facility]) [[id number]]  Admitting Physician: Maryln Gottron [[id number]]  Attending Physician: Kirby Crigler, MIR [email or web address] [[id number]]  Certification:: I certify this patient will need inpatient services for at least 2 midnights  Expected Medical Readiness: 10/12/2023          B Medical/Surgery History Past Medical History:   Diagnosis Date   Fracture, humerus closed    G tube feedings (HCC)    Pneumonia    Recurrent fever of unknown cause 08/31/2012   Fever to 104-105 lasting 24 hrs, occuring once a week for the past 5 weeks (onset Sept 2013)   Rett's syndrome    Scoliosis    Seizure disorder (HCC)    Seizures (HCC)    Weight loss    Past Surgical History:  Procedure Laterality Date   BACK SURGERY     GASTROSTOMY TUBE PLACEMENT     SPINAL GROWTH RODS     SPINE SURGERY N/A    Phreesia 02/14/2021     A IV Location/Drains/Wounds Patient Lines/Drains/Airways Status     Active Line/Drains/Airways     Name Placement date Placement time Site Days   Peripheral IV 10/09/23 20 G Anterior;Distal;Right;Upper Arm 10/09/23  1332  Arm  less than 1   Peripheral IV 10/09/23 20 G Anterior;Distal;Left;Upper Arm 10/09/23  1332  Arm  less than 1   Gastrostomy/Enterostomy Gastrostomy LUQ --  --  LUQ  --            Intake/Output Last 24 hours No intake or output data in the 24 hours ending 10/09/23 1741  Labs/Imaging Results for orders placed or performed during the hospital encounter of 10/09/23 (from the past 48 hour(s))  Resp panel by RT-PCR (RSV, Flu A&B, Covid) Anterior Nasal Swab     Status: None   Collection Time: 10/09/23  1:10 PM   Specimen: Anterior Nasal Swab  Result Value Ref Range   SARS Coronavirus 2 by  RT PCR NEGATIVE NEGATIVE    Comment: (NOTE) SARS-CoV-2 target nucleic acids are NOT DETECTED.  The SARS-CoV-2 RNA is generally detectable in upper respiratory specimens during the acute phase of infection. The lowest concentration of SARS-CoV-2 viral copies this assay can detect is 138 copies/mL. A negative result does not preclude SARS-Cov-2 infection and should not be used as the sole basis for treatment or other patient management decisions. A negative result may occur with  improper specimen collection/handling, submission of specimen other than nasopharyngeal swab, presence of viral  mutation(s) within the areas targeted by this assay, and inadequate number of viral copies(<138 copies/mL). A negative result must be combined with clinical observations, patient history, and epidemiological information. The expected result is Negative.  Fact Sheet for Patients:  BloggerCourse.com  Fact Sheet for Healthcare Providers:  SeriousBroker.it  This test is no t yet approved or cleared by the [place] FDA and  has been authorized for detection and/or diagnosis of SARS-CoV-2 by FDA under an Emergency Use Authorization (EUA). This EUA will remain  in effect (meaning this test can be used) for the duration of the COVID-19 declaration under Section 564(b)(1) of the Act, 21 U.S.C.section 360bbb-3(b)(1), unless the authorization is terminated  or revoked sooner.       Influenza A by PCR NEGATIVE NEGATIVE   Influenza B by PCR NEGATIVE NEGATIVE    Comment: (NOTE) The Xpert Xpress SARS-CoV-2/FLU/RSV plus assay is intended as an aid in the diagnosis of influenza from Nasopharyngeal swab specimens and should not be used as a sole basis for treatment. Nasal washings and aspirates are unacceptable for Xpert Xpress SARS-CoV-2/FLU/RSV testing.  Fact Sheet for Patients: BloggerCourse.com  Fact Sheet for Healthcare Providers: SeriousBroker.it  This test is not yet approved or cleared by the [place] FDA and has been authorized for detection and/or diagnosis of SARS-CoV-2 by FDA under an Emergency Use Authorization (EUA). This EUA will remain in effect (meaning this test can be used) for the duration of the COVID-19 declaration under Section 564(b)(1) of the Act, 21 U.S.C. section 360bbb-3(b)(1), unless the authorization is terminated or revoked.     Resp Syncytial Virus by PCR NEGATIVE NEGATIVE    Comment: (NOTE) Fact Sheet for  Patients: BloggerCourse.com  Fact Sheet for Healthcare Providers: SeriousBroker.it  This test is not yet approved or cleared by the [place] FDA and has been authorized for detection and/or diagnosis of SARS-CoV-2 by FDA under an Emergency Use Authorization (EUA). This EUA will remain in effect (meaning this test can be used) for the duration of the COVID-19 declaration under Section 564(b)(1) of the Act, 21 U.S.C. section 360bbb-3(b)(1), unless the authorization is terminated or revoked.  Performed at [facility], [address] [place], [place] [address]   Comprehensive metabolic panel     Status: Abnormal   Collection Time: 10/09/23  1:20 PM  Result Value Ref Range   Sodium 139 135 - 145 mmol/L   Potassium 3.6 3.5 - 5.1 mmol/L   Chloride 92 (L) 98 - 111 mmol/L   CO2 37 (H) 22 - 32 mmol/L   Glucose, Bld 159 (H) 70 - 99 mg/dL    Comment: Glucose reference range applies only to samples taken after fasting for at least 8 hours.   BUN 8 6 - 20 mg/dL   Creatinine, Ser <1.91 (L) 0.44 - 1.00 mg/dL   Calcium 9.2 8.9 - 47.8 mg/dL   Total Protein 6.6 6.5 - 8.1 g/dL   Albumin 3.1 (L) 3.5 -  5.0 g/dL   AST 18 15 - 41 U/L   ALT 12 0 - 44 U/L   Alkaline Phosphatase 45 38 - 126 U/L   Total Bilirubin 0.3 <1.2 mg/dL   GFR, Estimated NOT CALCULATED >60 mL/min    Comment: (NOTE) Calculated using the CKD-EPI Creatinine Equation (2021)    Anion gap 10 5 - 15    Comment: Performed at [facility], [address] [place], [place] [address]  CBC     Status: Abnormal   Collection Time: 10/09/23  1:20 PM  Result Value Ref Range   WBC 13.3 (H) 4.0 - 10.5 K/uL   RBC 3.05 (L) 3.87 - 5.11 MIL/uL   Hemoglobin 10.4 (L) 12.0 - 15.0 g/dL   HCT 29.5 (L) 62.1 - 30.8 %   MCV 107.5 (H) 80.0 - 100.0 fL   MCH 34.1 (H) 26.0 - 34.0 pg   MCHC 31.7 30.0 - 36.0 g/dL   RDW 65.7 84.6 - 96.2 %   Platelets 181 150 -  400 K/uL   nRBC 0.0 0.0 - 0.2 %    Comment: Performed at [facility], [address] [place], [place] [address]  Blood gas, venous (at [facility] and [facility])     Status: Abnormal   Collection Time: 10/09/23  1:20 PM  Result Value Ref Range   pH, Ven 7.25 7.25 - 7.43   pCO2, Ven 99 (HH) 44 - 60 mmHg    Comment: CRITICAL RESULT CALLED TO, READ BACK BY AND VERIFIED WITH: Glynn Octave RN AT 1352 10/09/23 TUCKER, J    pO2, Ven 59 (H) 32 - 45 mmHg   Bicarbonate 43.4 (H) 20.0 - 28.0 mmol/L   Acid-Base Excess 12.8 (H) 0.0 - 2.0 mmol/L   O2 Saturation 86.9 %   Patient temperature 37.0     Comment: Performed at [facility], [address], [place], [place] [address]  I-Stat CG4 Lactic Acid     Status: None   Collection Time: 10/09/23  1:38 PM  Result Value Ref Range   Lactic Acid, Venous 1.0 0.5 - 1.9 mmol/L  I-Stat CG4 Lactic Acid     Status: None   Collection Time: 10/09/23  3:24 PM  Result Value Ref Range   Lactic Acid, Venous 1.4 0.5 - 1.9 mmol/L  Urinalysis, Routine w reflex microscopic -Urine, Catheterized     Status: Abnormal   Collection Time: 10/09/23  5:05 PM  Result Value Ref Range   Color, Urine STRAW (A) YELLOW   APPearance CLEAR CLEAR   Specific Gravity, Urine >1.046 (H) 1.005 - 1.030   pH 7.0 5.0 - 8.0   Glucose, UA NEGATIVE NEGATIVE mg/dL   Hgb urine dipstick NEGATIVE NEGATIVE   Bilirubin Urine NEGATIVE NEGATIVE   Ketones, ur NEGATIVE NEGATIVE mg/dL   Protein, ur NEGATIVE NEGATIVE mg/dL   Nitrite NEGATIVE NEGATIVE   Leukocytes,Ua NEGATIVE NEGATIVE    Comment: Performed at [facility], [address], [place], [place] [address]   CT Angio Chest Pulmonary Embolism (PE) W or WO Contrast  Result Date: 10/09/2023 CLINICAL DATA:  Increased oxygen requirement. EXAM: CT ANGIOGRAPHY CHEST WITH CONTRAST TECHNIQUE: Multidetector CT imaging of the chest was performed using the standard protocol during bolus administration of  intravenous contrast. Multiplanar CT image reconstructions and MIPs were obtained to evaluate the vascular anatomy. RADIATION DOSE REDUCTION: This exam was performed according to the departmental dose-optimization program which includes automated exposure control, adjustment of the mA and/or  kV according to patient size and/or use of iterative reconstruction technique. CONTRAST:  75mL OMNIPAQUE IOHEXOL 350 MG/ML SOLN COMPARISON:  Chest x-ray earlier today. Prior CTA of the chest on 08/05/2017 FINDINGS: Cardiovascular: The pulmonary arteries are adequately opacified. There is no evidence of pulmonary embolism. Central pulmonary arteries are not dilated. The thoracic aorta is normal in caliber. The heart is mild-to-moderately enlarged. No pericardial fluid present. No visible calcified coronary artery plaque. Mediastinum/Nodes: No enlarged mediastinal, hilar, or axillary lymph nodes. Thyroid gland, trachea, and esophagus demonstrate no significant findings. Lungs/Pleura: New complete atelectasis of the left lung with cut off of the left mainstem bronchus consistent with occlusion, likely related to mucous plug. New airspace disease of the right lower lobe is consistent with pneumonia. No associated pleural effusions. No pneumothorax. The right upper lobe and middle lobe are normally aerated. Upper Abdomen: Hepatomegaly and hepatic steatosis. Musculoskeletal: Extensive spinal fusion hardware again noted. Review of the MIP images confirms the above findings. IMPRESSION: 1. No evidence of pulmonary embolism. 2. New complete atelectasis of the left lung with cut off of the left mainstem bronchus consistent with occlusion, likely related to mucous plug. 3. New airspace disease of the right lower lobe is consistent with pneumonia. 4. Mild-to-moderate cardiomegaly. 5. Hepatomegaly and hepatic steatosis. Electronically Signed   By: Irish Lack M.D.   On: 10/09/2023 16:12   DG Chest 2 View  Result Date:  10/09/2023 CLINICAL DATA:  Fever and cough.  Evaluate for pneumonia. EXAM: CHEST - 2 VIEW COMPARISON:  Chest radiographs 01/20/2023, 05/20/2022, 05/01/2021 FINDINGS: As on the comparison studies, there is moderate hypoinflation. There is worsened now homogeneous opacification of the majority of the left lung. Moderate heterogeneous opacification of the inferior right lung is new from most recent 05/20/2022 and 01/20/2023 comparison radiographs. The cardiac silhouette and mediastinal contours are not well evaluated given the left lung opacities. No definite pneumothorax is seen. Mild dextrocurvature of the midthoracic spine with extensive thoracic and lumbar posterior rod and screw fusion hardware. Left upper abdominal quadrant gastrostomy tube is noted. IMPRESSION: 1. Worsened now homogeneous opacification of the majority of the left lung. This may represent a combination of pleural effusion, atelectasis, and/or pneumonia. 2. Moderate heterogeneous opacification of the inferior right lung is new from most recent 05/20/2022 and 01/20/2023 comparison radiographs. This also may represent atelectasis and/or pneumonia. Electronically Signed   By: Neita Garnet M.D.   On: 10/09/2023 11:50    Pending Labs Unresulted Labs (From admission, onward)     Start     Ordered   10/10/23 0500  Basic metabolic panel  Tomorrow morning,   R        10/09/23 1713   10/10/23 0500  CBC  Tomorrow morning,   R        10/09/23 1713   10/09/23 1712  HIV Antibody (routine testing w rflx)  (HIV Antibody (Routine testing w reflex) panel)  Once,   R        10/09/23 1713   10/09/23 1400  hCG, serum, qualitative  Once,   R        10/09/23 1400   10/09/23 1243  Blood culture (routine x 2)  BLOOD CULTURE X 2,   R (with STAT occurrences)      10/09/23 1245            Vitals/Pain Today's Vitals   10/09/23 1217 10/09/23 1438 10/09/23 1500 10/09/23 1700  BP: 119/67  105/75   Pulse: 99  (!) 108 (!)  117  Resp: 16  (!) 23    Temp: (!) 97.3 F (36.3 C)   100.3 F (37.9 C)  TempSrc: Oral   Rectal  SpO2: 100%  99% 98%  Weight: 45.8 kg 46 kg    Height:  4\' 11"  (1.499 m)    PainSc: 0-No pain       Isolation Precautions No active isolations  Medications Medications  enoxaparin (LOVENOX) injection 40 mg (has no administration in time range)  acetaminophen (TYLENOL) tablet 650 mg (has no administration in time range)    Or  acetaminophen (TYLENOL) suppository 650 mg (has no administration in time range)  senna-docusate (Senokot-S) tablet 1 tablet (has no administration in time range)  ondansetron (ZOFRAN) tablet 4 mg (has no administration in time range)    Or  ondansetron (ZOFRAN) injection 4 mg (has no administration in time range)  albuterol (PROVENTIL) (2.5 MG/3ML) 0.083% nebulizer solution 2.5 mg (has no administration in time range)  acetylcysteine (MUCOMYST) 20 % nebulizer / oral solution 3 mL (has no administration in time range)  levofloxacin (LEVAQUIN) IVPB 750 mg (0 mg Intravenous Stopped 10/09/23 1640)  sodium chloride 0.9 % bolus 1,000 mL (1,000 mLs Intravenous New Bag/Given 10/09/23 1428)  iohexol (OMNIPAQUE) 350 MG/ML injection 75 mL (75 mLs Intravenous Contrast Given 10/09/23 1540)    Mobility manual wheelchair     Focused Assessments Pneumonia    R Recommendations: See Admitting Provider Note  Report given to:   Additional Notes: non-verbal, non-ambulatory, mom at bedside.

## 2023-10-10 ENCOUNTER — Inpatient Hospital Stay (HOSPITAL_COMMUNITY): Payer: Medicaid Other

## 2023-10-10 DIAGNOSIS — D649 Anemia, unspecified: Secondary | ICD-10-CM

## 2023-10-10 DIAGNOSIS — J9622 Acute and chronic respiratory failure with hypercapnia: Secondary | ICD-10-CM | POA: Diagnosis not present

## 2023-10-10 DIAGNOSIS — J9621 Acute and chronic respiratory failure with hypoxia: Secondary | ICD-10-CM | POA: Diagnosis not present

## 2023-10-10 DIAGNOSIS — J189 Pneumonia, unspecified organism: Secondary | ICD-10-CM | POA: Diagnosis not present

## 2023-10-10 LAB — BASIC METABOLIC PANEL
Anion gap: 7 (ref 5–15)
BUN: 9 mg/dL (ref 6–20)
CO2: 37 mmol/L — ABNORMAL HIGH (ref 22–32)
Calcium: 9 mg/dL (ref 8.9–10.3)
Chloride: 95 mmol/L — ABNORMAL LOW (ref 98–111)
Creatinine, Ser: <0.3 mg/dL — ABNORMAL LOW (ref 0.44–1.00)
Glucose, Bld: 86 mg/dL (ref 70–99)
Potassium: 3.7 mmol/L (ref 3.5–5.1)
Sodium: 139 mmol/L (ref 135–145)

## 2023-10-10 LAB — CBC
HCT: 32.1 % — ABNORMAL LOW (ref 36.0–46.0)
Hemoglobin: 9.6 g/dL — ABNORMAL LOW (ref 12.0–15.0)
MCH: 33.1 pg (ref 26.0–34.0)
MCHC: 29.9 g/dL — ABNORMAL LOW (ref 30.0–36.0)
MCV: 110.7 fL — ABNORMAL HIGH (ref 80.0–100.0)
Platelets: 183 10*3/uL (ref 150–400)
RBC: 2.9 MIL/uL — ABNORMAL LOW (ref 3.87–5.11)
RDW: 12.8 % (ref 11.5–15.5)
WBC: 11 10*3/uL — ABNORMAL HIGH (ref 4.0–10.5)
nRBC: 0 % (ref 0.0–0.2)

## 2023-10-10 LAB — GLUCOSE, CAPILLARY: Glucose-Capillary: 79 mg/dL (ref 70–99)

## 2023-10-10 LAB — HCG, SERUM, QUALITATIVE: Preg, Serum: NEGATIVE

## 2023-10-10 LAB — HIV ANTIBODY (ROUTINE TESTING W REFLEX): HIV Screen 4th Generation wRfx: NONREACTIVE

## 2023-10-10 MED ORDER — IBUPROFEN 100 MG/5ML PO SUSP
400.0000 mg | Freq: Four times a day (QID) | ORAL | Status: AC | PRN
  Administered 2023-10-10: 400 mg
  Filled 2023-10-10 (×2): qty 20

## 2023-10-10 MED ORDER — VALPROATE SODIUM 100 MG/ML IV SOLN
500.0000 mg | Freq: Every day | INTRAVENOUS | Status: DC
  Filled 2023-10-10: qty 5

## 2023-10-10 MED ORDER — CLONAZEPAM 0.25 MG PO TBDP: 0.2500 mg | ORAL_TABLET | ORAL | Status: DC

## 2023-10-10 MED ORDER — CLONAZEPAM 0.125 MG PO TBDP
0.2500 mg | ORAL_TABLET | Freq: Every day | ORAL | Status: DC
  Administered 2023-10-10 – 2023-10-18 (×9): 0.25 mg
  Filled 2023-10-10 (×9): qty 2

## 2023-10-10 MED ORDER — SODIUM CHLORIDE 0.9 % IV SOLN
2250.0000 mg | Freq: Every day | INTRAVENOUS | Status: DC
  Administered 2023-10-10: 2250 mg via INTRAVENOUS
  Filled 2023-10-10: qty 22.5

## 2023-10-10 MED ORDER — DIAZEPAM 2.5 MG RE GEL
12.5000 mg | RECTAL | Status: DC | PRN
  Administered 2023-10-11: 12.5 mg via RECTAL
  Filled 2023-10-10: qty 5

## 2023-10-10 MED ORDER — VALPROATE SODIUM 100 MG/ML IV SOLN
312.5000 mg | Freq: Four times a day (QID) | INTRAVENOUS | Status: DC
  Administered 2023-10-10 – 2023-10-11 (×6): 312.5 mg via INTRAVENOUS
  Filled 2023-10-10 (×9): qty 3.13

## 2023-10-10 MED ORDER — PERAMPANEL 0.5 MG/ML PO SUSP
2.0000 mg | Freq: Every day | ORAL | Status: DC
Start: 2023-10-10 — End: 2023-10-12
  Administered 2023-10-10 – 2023-10-11 (×2): 2 mg
  Filled 2023-10-10 (×3): qty 4

## 2023-10-10 MED ORDER — CLONAZEPAM 0.125 MG PO TBDP
0.2500 mg | ORAL_TABLET | Freq: Every day | ORAL | Status: DC | PRN
  Administered 2023-10-11 – 2023-10-12 (×2): 0.25 mg
  Filled 2023-10-10 (×3): qty 2

## 2023-10-10 MED ORDER — SODIUM CHLORIDE 0.45 % IV SOLN: INTRAVENOUS | Status: AC

## 2023-10-10 MED ORDER — CANNABIDIOL 100 MG/ML PO SOLN
350.0000 mg | Freq: Two times a day (BID) | ORAL | Status: DC
Start: 2023-10-10 — End: 2023-10-18
  Administered 2023-10-10 – 2023-10-18 (×17): 350 mg
  Filled 2023-10-10 (×19): qty 3.5

## 2023-10-10 MED ORDER — CLONAZEPAM 0.125 MG PO TBDP
0.5000 mg | ORAL_TABLET | Freq: Every day | ORAL | Status: DC
  Administered 2023-10-10 – 2023-10-17 (×8): 0.5 mg
  Filled 2023-10-10: qty 4
  Filled 2023-10-10 (×7): qty 1

## 2023-10-10 MED ORDER — CARMEX CLASSIC LIP BALM EX OINT
1.0000 | TOPICAL_OINTMENT | CUTANEOUS | Status: DC | PRN
  Administered 2023-10-10: 1 via TOPICAL
  Filled 2023-10-10: qty 10

## 2023-10-10 MED ORDER — VALPROATE SODIUM 100 MG/ML IV SOLN
375.0000 mg | Freq: Two times a day (BID) | INTRAVENOUS | Status: DC
  Filled 2023-10-10 (×2): qty 3.75

## 2023-10-10 MED ORDER — SODIUM CHLORIDE 0.9 % IV SOLN
2000.0000 mg | Freq: Every day | INTRAVENOUS | Status: DC
  Administered 2023-10-10: 2000 mg via INTRAVENOUS
  Filled 2023-10-10 (×2): qty 20

## 2023-10-10 NOTE — Hospital Course (Signed)
Jo Matthews is a 27 y.o. female with a history of Rett syndrome, severe sclerosis, seizure disorder, chronic respiratory failure with hypoxia.  Patient presented secondary to hypoxia and found to have sepsis secondary to pneumonia with associated mucous plugging. Levaquin started. Pulmonology consulted. No bronchoscopy recommended, but patient started on conservative treatment with aggressive pulmonary toilet with nebulizer, hypertonic saline and chest PT treatments.

## 2023-10-10 NOTE — Plan of Care (Signed)
Problem: Health Behavior/Discharge Planning: Goal: Ability to manage health-related needs will improve Outcome: Progressing   Problem: Clinical Measurements: Goal: Ability to maintain clinical measurements within normal limits will improve Outcome: Progressing Goal: Diagnostic test results will improve Outcome: Progressing   Problem: Skin Integrity: Goal: Risk for impaired skin integrity will decrease Outcome: Progressing

## 2023-10-10 NOTE — Consult Note (Signed)
NAME:  Jo Matthews, MRN:  [id number], DOB:  02/27/1996, LOS: 1 ADMISSION DATE:  10/09/2023, CONSULTATION DATE:  10/09/2023 REFERRING MD:  Dr. Theresia Lo - EDP, CHIEF COMPLAINT:  Mucus plug    History of Present Illness:  Jo Matthews is a 27 y.o. female with an extensive PMH significant  for Retts Syndrome, seizure disorder, cerebral palsy, and chronic hypoxic respiratory failure on 0.5-1L [place] at baseline who presented to the ED 11/22 for worsening hypoxia, cough, SOB, and fatigue. Patient was seen by PCP who obtained a CXR that was concerning for PNA and prompted family to present to the ED. On ED arrival patient was seen with mild tachycardia with all other vitals WNL on 6L [place]  Patient was admitted per [facility] for management of CAP   CTA chest obtained and negative for PE but positive for right sisded PNA and near complete collapse of left in the setting of mucus plug. PCCM consulted for assistance in management.    Pertinent  Medical History   Retts Syndrome, seizure disorder, cerebral palsy, and chronic hypoxic respiratory failure on 0.5-1L [place] at baseline  Significant Hospital Events: Including procedures, antibiotic start and stop dates in addition to other pertinent events   11/22 Presented at PCP recommendation for management of CAP. CTA chest obtained and negative for PE but positive for right sisded PNA and near complete collapse of left in the setting of mucus plug.  11/23 Improved left upper lobe aeration  Interim History / Subjective:  Improved oxygenation. Remains 100% on 3L Mom states she is still drowsy but more interactive compared to admission  Objective   Blood pressure (!) 91/54, pulse (!) 107, temperature 98.7 F (37.1 C), temperature source Axillary, resp. rate (!) 31, height 4\' 11"  (1.499 m), weight 46 kg, SpO2 96%.       No intake or output data in the 24 hours ending 10/10/23 0748 Filed Weights   10/09/23 1217 10/09/23 1438  Weight: 45.8 kg 46 kg   Physical  Exam: General: Chronically ill-appearing, no acute distress HENT: [place], AT, OP clear, MMM Eyes: EOMI, no scleral icterus Respiratory: Diminished but improved left upper air entry.  No crackles, wheezing or rales Cardiovascular: RRR, -M/R/G, no JVD GI: BS+, soft, nontender Extremities:-Edema,-tenderness Neuro: Drowsy, eyes open to voice, CN II-XII grossly intact  Imaging, labs and test in EMR in the last 24 hours reviewed independently by me. Pertinent findings below: WBC 11 improved CXR Improved left upper lobe aeration  Resolved Hospital Problem list     Assessment & Plan:  Acute on chronic hypoxic and hypercapnic respiratory failure  -At baseline uses 0.5-1L [place], improved from 5 to 3L Community acquired pneumonia present on admission Left mucus plug in the setting of multiple musculoskeletal dysfunctions -CTA chest obtained and negative for PE but positive for right sisded PNA and near complete collapse of left in the setting of mucus plug.   Patient would benefit from aggressive pulmonary toilet including CPT, nebulizers, hypertonic saline. Treatment of underlying pneumonia with antibiotics. Discussed steroids to decrease inflammation and mucous production however mother would prefer to try above measures first. Patient has previously required NIV during acute illness with sepsis in the past. Has device at home but has not recently needed it. Her borderline hypercarbia may benefit from it especially if she is more lethargic but will focus on pulmonary toilet first. Currently only requires 4-6L O2 and 100%, wean for goal SpO2 >88%. Bronchoscopy could help in setting of urgent oxygenation issues however mucous  plugging will likely recur and risks vs benefit of procedure with moderate sedation should be considered if pursued. Will opt with conservative measures for now since lower O2 requirement at this time.   P: Wean oxygen as needed for sats greater than 88% Aggressive pulmonary toilet  with scheduled nebs, hypertonic saline, CPT q2h when awake NT suctioning PRN Head of bed elevated 30 degrees As needed BIPAP Follow intermittent chest x-ray and ABG as needed  Ensure adequate pulmonary hygiene  Follow cultures  Aspiration precautions    Best Practice (right click and "Reselect all SmartList Selections" daily)  Per primary    Critical care time: NA   Updated primary team and mom at bedside Care Time: 35 min  Mechele Collin, M.D. [facility] Pulmonary/Critical Care Medicine 10/10/2023 7:48 AM   See Amion for personal pager For hours between 7 PM to 7 AM, please call Elink for urgent questions

## 2023-10-10 NOTE — Progress Notes (Addendum)
PROGRESS NOTE    Jo Matthews  XNA:[id number] DOB: 27-Jul-1996 DOA: 10/09/2023 PCP: Georgiann Hahn, MD   Brief Narrative: Jo Matthews is a 27 y.o. female with a history of Rett syndrome, severe sclerosis, seizure disorder, chronic respiratory failure with hypoxia.  Patient presented secondary to hypoxia and found to have sepsis secondary to pneumonia with associated mucous plugging. Levaquin started. Pulmonology consulted. No bronchoscopy recommended, but patient started on conservative treatment with aggressive pulmonary toilet with nebulizer, hypertonic saline and chest PT treatments.   Assessment and Plan:  Sepsis Present on admission and secondary to pneumonia. Patient with leukocytosis and fever with associated acute respiratory failure. Blood cultures obtained on admission. Empiric antibiotics started for treatment of pneumonia. -Continue antibiotics and follow-up blood culture data  Community acquired pneumonia Present on admission. Associated leukocytosis. CT imaging significant for new airspace disease of the right lower lobe pneumonia. Patient started empirically on Levaquin. -Continue Levaquin -Sputum culture  Acute on chronic respiratory failure with hypoxia Secondary to pneumonia and mucous plugging. Patient uses about 0.5 - 1L/min baseline. Patient requiring up to 5 L/min this admission. Weaned to 3 L/min today -Continue weaning to baseline 1 L/min as able  Mucous plugging Contributing to respiratory failure. Recommendation for aggressive pulmonary toilet. No bronchoscopy recommended at this time. -Continue chest PT, hypertonic saline nebulizer treatments, Mucomyst  Chronic anemia Hemoglobin is stable at baseline.  Hepatomegaly Hepatic steatosis Noted.  Cardiomegaly Noted on CT imaging. No history of heart failure. Presentation not consistent with acute heart failure. Recommend outpatient workup.  Seizure disorder -Continue Depakote and Keppra via IV until  mental status improves -Discussed with Neurology on-call; recommendation to switch to Depakote 312.5 mg q6 hours while using IV. Recommendation to continue perampanel -Diazepam BID and as needed for seizures   DVT prophylaxis: Lovenox Code Status:   Code Status: Full Code Family Communication: Mother at bedside Disposition Plan: Discharge home likely in 3-5 days pending ability to wean oxygen to baseline, improvement in pneumonia with ability to transition to outpatient regimen, ongoing pulmonology recommendations   Consultants:  Pulmonology  Procedures:  None  Antimicrobials: Levaquin    Subjective: Patient is asleep. Per mother, patient has been a little bit more alert. Per mother, patient has also started coughing up sputum.  Objective: BP (!) 91/54 (BP Location: Left Arm)   Pulse (!) 117   Temp 98.7 F (37.1 C) (Axillary)   Resp 20   Ht 4\' 11"  (1.499 m)   Wt 46 kg   SpO2 99%   BMI 20.48 kg/m   Examination:  General exam: Appears calm and comfortable Respiratory system: Diminished left sided and right lower lung breath sounds. Clear left upper lung air sounds. Mild expiratory wheeze. Cardiovascular system: S1 & S2 heard, RRR. Gastrointestinal system: Abdomen is nondistended, soft and nontender. No organomegaly or masses felt. Normal bowel sounds heard. Musculoskeletal: No calf tenderness   Data Reviewed: I have personally reviewed following labs and imaging studies  CBC Lab Results  Component Value Date   WBC 11.0 (H) 10/10/2023   RBC 2.90 (L) 10/10/2023   HGB 9.6 (L) 10/10/2023   HCT 32.1 (L) 10/10/2023   MCV 110.7 (H) 10/10/2023   MCH 33.1 10/10/2023   PLT 183 10/10/2023   MCHC 29.9 (L) 10/10/2023   RDW 12.8 10/10/2023   LYMPHSABS 3,330 12/26/2022   MONOABS 0.4 05/03/2021   EOSABS 259 12/26/2022   BASOSABS 52 12/26/2022     Last metabolic panel Lab Results  Component Value Date  NA 139 10/10/2023   K 3.7 10/10/2023   CL 95 (L) 10/10/2023    CO2 37 (H) 10/10/2023   BUN 9 10/10/2023   CREATININE <0.30 (L) 10/10/2023   GLUCOSE 86 10/10/2023   GFRNONAA NOT CALCULATED 10/10/2023   GFRAA >60 08/05/2017   CALCIUM 9.0 10/10/2023   PHOS 2.8 12/06/2014   PROT 6.6 10/09/2023   ALBUMIN 3.1 (L) 10/09/2023   BILITOT 0.3 10/09/2023   ALKPHOS 45 10/09/2023   AST 18 10/09/2023   ALT 12 10/09/2023   ANIONGAP 7 10/10/2023    GFR: CrCl cannot be calculated (This lab value cannot be used to calculate CrCl because it is not a number: <0.30).  Recent Results (from the past 240 hour(s))  Resp panel by RT-PCR (RSV, Flu A&B, Covid) Anterior Nasal Swab     Status: None   Collection Time: 10/09/23  1:10 PM   Specimen: Anterior Nasal Swab  Result Value Ref Range Status   SARS Coronavirus 2 by RT PCR NEGATIVE NEGATIVE Final    Comment: (NOTE) SARS-CoV-2 target nucleic acids are NOT DETECTED.  The SARS-CoV-2 RNA is generally detectable in upper respiratory specimens during the acute phase of infection. The lowest concentration of SARS-CoV-2 viral copies this assay can detect is 138 copies/mL. A negative result does not preclude SARS-Cov-2 infection and should not be used as the sole basis for treatment or other patient management decisions. A negative result may occur with  improper specimen collection/handling, submission of specimen other than nasopharyngeal swab, presence of viral mutation(s) within the areas targeted by this assay, and inadequate number of viral copies(<138 copies/mL). A negative result must be combined with clinical observations, patient history, and epidemiological information. The expected result is Negative.  Fact Sheet for Patients:  BloggerCourse.com  Fact Sheet for Healthcare Providers:  SeriousBroker.it  This test is no t yet approved or cleared by the [place] FDA and  has been authorized for detection and/or diagnosis of SARS-CoV-2 by FDA under an  Emergency Use Authorization (EUA). This EUA will remain  in effect (meaning this test can be used) for the duration of the COVID-19 declaration under Section 564(b)(1) of the Act, 21 U.S.C.section 360bbb-3(b)(1), unless the authorization is terminated  or revoked sooner.       Influenza A by PCR NEGATIVE NEGATIVE Final   Influenza B by PCR NEGATIVE NEGATIVE Final    Comment: (NOTE) The Xpert Xpress SARS-CoV-2/FLU/RSV plus assay is intended as an aid in the diagnosis of influenza from Nasopharyngeal swab specimens and should not be used as a sole basis for treatment. Nasal washings and aspirates are unacceptable for Xpert Xpress SARS-CoV-2/FLU/RSV testing.  Fact Sheet for Patients: BloggerCourse.com  Fact Sheet for Healthcare Providers: SeriousBroker.it  This test is not yet approved or cleared by the [place] FDA and has been authorized for detection and/or diagnosis of SARS-CoV-2 by FDA under an Emergency Use Authorization (EUA). This EUA will remain in effect (meaning this test can be used) for the duration of the COVID-19 declaration under Section 564(b)(1) of the Act, 21 U.S.C. section 360bbb-3(b)(1), unless the authorization is terminated or revoked.     Resp Syncytial Virus by PCR NEGATIVE NEGATIVE Final    Comment: (NOTE) Fact Sheet for Patients: BloggerCourse.com  Fact Sheet for Healthcare Providers: SeriousBroker.it  This test is not yet approved or cleared by the [place] FDA and has been authorized for detection and/or diagnosis of SARS-CoV-2 by FDA under an Emergency Use Authorization (EUA). This EUA will remain in  effect (meaning this test can be used) for the duration of the COVID-19 declaration under Section 564(b)(1) of the Act, 21 U.S.C. section 360bbb-3(b)(1), unless the authorization is terminated or revoked.  Performed at [facility], [address], [place], [place] [address]       Radiology Studies: DG CHEST PORT 1 VIEW  Result Date: 10/10/2023 CLINICAL DATA:  27 year old female with increased oxygen requirement. Complete left lung atelectasis on CTA yesterday, suspected mucous plugging. EXAM: PORTABLE CHEST 1 VIEW COMPARISON:  CTA chest and radiographs yesterday. FINDINGS: Portable AP semi upright view at 0611 hours.Ongoing dense perihilar and bibasilar lung opacification. But peripheral left upper lung ventilation has improved since 1119 hours yesterday. Improved visualization of airways at the bifurcation of the left mainstem. Right lung base opacity is more veiling now. Most mediastinal contours are obscured. Small volume pleural effusion also visible in the left lung apex. Visualized tracheal air column is within normal limits. Extensive posterior spinal rod fusion hardware is stable. Paucity of bowel gas. IMPRESSION: 1. Ongoing bilateral lung opacification, but left upper lobe ventilation has improved since 1119 hours yesterday. Left mainstem bronchus patency appears improved. 2. Evidence of bilateral pleural effusions, new since yesterday. Electronically Signed   By: Odessa Fleming M.D.   On: 10/10/2023 06:33   CT Angio Chest Pulmonary Embolism (PE) W or WO Contrast  Result Date: 10/09/2023 CLINICAL DATA:  Increased oxygen requirement. EXAM: CT ANGIOGRAPHY CHEST WITH CONTRAST TECHNIQUE: Multidetector CT imaging of the chest was performed using the standard protocol during bolus administration of intravenous contrast. Multiplanar CT image reconstructions and MIPs were obtained to evaluate the vascular anatomy. RADIATION DOSE REDUCTION: This exam was performed according to the departmental dose-optimization program which includes automated exposure control, adjustment of the mA and/or kV according to patient size and/or use of iterative reconstruction technique. CONTRAST:  75mL OMNIPAQUE IOHEXOL 350 MG/ML SOLN  COMPARISON:  Chest x-ray earlier today. Prior CTA of the chest on 08/05/2017 FINDINGS: Cardiovascular: The pulmonary arteries are adequately opacified. There is no evidence of pulmonary embolism. Central pulmonary arteries are not dilated. The thoracic aorta is normal in caliber. The heart is mild-to-moderately enlarged. No pericardial fluid present. No visible calcified coronary artery plaque. Mediastinum/Nodes: No enlarged mediastinal, hilar, or axillary lymph nodes. Thyroid gland, trachea, and esophagus demonstrate no significant findings. Lungs/Pleura: New complete atelectasis of the left lung with cut off of the left mainstem bronchus consistent with occlusion, likely related to mucous plug. New airspace disease of the right lower lobe is consistent with pneumonia. No associated pleural effusions. No pneumothorax. The right upper lobe and middle lobe are normally aerated. Upper Abdomen: Hepatomegaly and hepatic steatosis. Musculoskeletal: Extensive spinal fusion hardware again noted. Review of the MIP images confirms the above findings. IMPRESSION: 1. No evidence of pulmonary embolism. 2. New complete atelectasis of the left lung with cut off of the left mainstem bronchus consistent with occlusion, likely related to mucous plug. 3. New airspace disease of the right lower lobe is consistent with pneumonia. 4. Mild-to-moderate cardiomegaly. 5. Hepatomegaly and hepatic steatosis. Electronically Signed   By: Irish Lack M.D.   On: 10/09/2023 16:12   DG Chest 2 View  Result Date: 10/09/2023 CLINICAL DATA:  Fever and cough.  Evaluate for pneumonia. EXAM: CHEST - 2 VIEW COMPARISON:  Chest radiographs 01/20/2023, 05/20/2022, 05/01/2021 FINDINGS: As on the comparison studies, there is moderate hypoinflation. There is worsened now homogeneous opacification of the majority of the left lung. Moderate heterogeneous opacification of the inferior  right lung is new from most recent 05/20/2022 and 01/20/2023 comparison  radiographs. The cardiac silhouette and mediastinal contours are not well evaluated given the left lung opacities. No definite pneumothorax is seen. Mild dextrocurvature of the midthoracic spine with extensive thoracic and lumbar posterior rod and screw fusion hardware. Left upper abdominal quadrant gastrostomy tube is noted. IMPRESSION: 1. Worsened now homogeneous opacification of the majority of the left lung. This may represent a combination of pleural effusion, atelectasis, and/or pneumonia. 2. Moderate heterogeneous opacification of the inferior right lung is new from most recent 05/20/2022 and 01/20/2023 comparison radiographs. This also may represent atelectasis and/or pneumonia. Electronically Signed   By: Neita Garnet M.D.   On: 10/09/2023 11:50      LOS: 1 day    Jacquelin Hawking, MD [facility] 10/10/2023, 8:03 AM   If 7PM-7AM, please contact night-coverage [email or web address]

## 2023-10-11 ENCOUNTER — Inpatient Hospital Stay (HOSPITAL_COMMUNITY): Payer: Medicaid Other

## 2023-10-11 ENCOUNTER — Encounter: Payer: Self-pay | Admitting: Pediatrics

## 2023-10-11 DIAGNOSIS — J9622 Acute and chronic respiratory failure with hypercapnia: Secondary | ICD-10-CM | POA: Diagnosis not present

## 2023-10-11 DIAGNOSIS — J189 Pneumonia, unspecified organism: Secondary | ICD-10-CM | POA: Diagnosis not present

## 2023-10-11 DIAGNOSIS — J159 Unspecified bacterial pneumonia: Secondary | ICD-10-CM | POA: Insufficient documentation

## 2023-10-11 DIAGNOSIS — J9621 Acute and chronic respiratory failure with hypoxia: Secondary | ICD-10-CM | POA: Diagnosis not present

## 2023-10-11 DIAGNOSIS — D649 Anemia, unspecified: Secondary | ICD-10-CM | POA: Diagnosis not present

## 2023-10-11 LAB — CBC
HCT: 30.5 % — ABNORMAL LOW (ref 36.0–46.0)
Hemoglobin: 9.4 g/dL — ABNORMAL LOW (ref 12.0–15.0)
MCH: 33.3 pg (ref 26.0–34.0)
MCHC: 30.8 g/dL (ref 30.0–36.0)
MCV: 108.2 fL — ABNORMAL HIGH (ref 80.0–100.0)
Platelets: 187 10*3/uL (ref 150–400)
RBC: 2.82 MIL/uL — ABNORMAL LOW (ref 3.87–5.11)
RDW: 12.8 % (ref 11.5–15.5)
WBC: 9.8 10*3/uL (ref 4.0–10.5)
nRBC: 0 % (ref 0.0–0.2)

## 2023-10-11 MED ORDER — SODIUM CHLORIDE 0.45 % IV SOLN
INTRAVENOUS | Status: AC
Start: 2023-10-11 — End: 2023-10-12

## 2023-10-11 MED ORDER — CHLORHEXIDINE GLUCONATE CLOTH 2 % EX PADS
6.0000 | MEDICATED_PAD | Freq: Every day | CUTANEOUS | Status: DC
  Administered 2023-10-11 – 2023-10-18 (×9): 6 via TOPICAL

## 2023-10-11 MED ORDER — LEVETIRACETAM 100 MG/ML PO SOLN
2250.0000 mg | Freq: Every day | ORAL | Status: DC
  Administered 2023-10-11 – 2023-10-17 (×7): 2250 mg
  Filled 2023-10-11 (×9): qty 22.5

## 2023-10-11 MED ORDER — DIVALPROEX SODIUM 125 MG PO CSDR
500.0000 mg | DELAYED_RELEASE_CAPSULE | Freq: Every day | ORAL | Status: DC
  Administered 2023-10-12 – 2023-10-17 (×6): 500 mg via ORAL
  Filled 2023-10-11 (×6): qty 4

## 2023-10-11 MED ORDER — LEVETIRACETAM 100 MG/ML PO SOLN: 2000.0000 mg | Freq: Every morning | ORAL | Status: DC

## 2023-10-11 MED ORDER — DIVALPROEX SODIUM 125 MG PO CSDR
375.0000 mg | DELAYED_RELEASE_CAPSULE | ORAL | Status: DC
  Administered 2023-10-12 – 2023-10-18 (×14): 375 mg via ORAL
  Filled 2023-10-11 (×19): qty 3

## 2023-10-11 MED ORDER — LEVETIRACETAM 100 MG/ML PO SOLN
2000.0000 mg | Freq: Every morning | ORAL | Status: DC
  Administered 2023-10-11 – 2023-10-18 (×8): 2000 mg
  Filled 2023-10-11 (×8): qty 20

## 2023-10-11 MED ORDER — OSMOLITE 1.5 CAL PO LIQD
237.0000 mL | Freq: Four times a day (QID) | ORAL | Status: DC
  Administered 2023-10-11 – 2023-10-13 (×5): 237 mL
  Filled 2023-10-11 (×8): qty 237

## 2023-10-11 MED ORDER — DIAZEPAM 10 MG RE GEL
10.0000 mg | RECTAL | Status: DC | PRN
  Administered 2023-10-12: 10 mg via RECTAL

## 2023-10-11 MED ORDER — OSMOLITE 1.5 CAL PO LIQD
237.0000 mL | Freq: Three times a day (TID) | ORAL | Status: DC
  Filled 2023-10-11: qty 237

## 2023-10-11 MED ORDER — DIAZEPAM 2.5 MG RE GEL: 10.0000 mg | RECTAL | Status: DC | PRN

## 2023-10-11 MED ORDER — IBUPROFEN 100 MG/5ML PO SUSP
400.0000 mg | Freq: Four times a day (QID) | ORAL | Status: AC | PRN
  Administered 2023-10-11: 400 mg
  Filled 2023-10-11: qty 20

## 2023-10-11 MED ORDER — LEVETIRACETAM 100 MG/ML PO SOLN: 2250.0000 mg | Freq: Every day | ORAL | Status: DC

## 2023-10-11 MED ORDER — DIVALPROEX SODIUM 125 MG PO CSDR
250.0000 mg | DELAYED_RELEASE_CAPSULE | Freq: Once | ORAL | Status: AC
Start: 2023-10-12 — End: 2023-10-12
  Administered 2023-10-12: 250 mg via ORAL
  Filled 2023-10-11 (×3): qty 2

## 2023-10-11 NOTE — Consult Note (Signed)
NAME:  Jo Matthews, MRN:  [id number], DOB:  10-06-1996, LOS: 2 ADMISSION DATE:  10/09/2023, CONSULTATION DATE:  10/09/2023 REFERRING MD:  Dr. Theresia Lo - EDP, CHIEF COMPLAINT:  Mucus plug    History of Present Illness:  Jo Matthews is a 27 y.o. female with an extensive PMH significant  for Retts Syndrome, seizure disorder, cerebral palsy, and chronic hypoxic respiratory failure on 0.5-1L [place] at baseline who presented to the ED 11/22 for worsening hypoxia, cough, SOB, and fatigue. Patient was seen by PCP who obtained a CXR that was concerning for PNA and prompted family to present to the ED. On ED arrival patient was seen with mild tachycardia with all other vitals WNL on 6L [place]  Patient was admitted per [facility] for management of CAP   CTA chest obtained and negative for PE but positive for right sisded PNA and near complete collapse of left in the setting of mucus plug. PCCM consulted for assistance in management.    Pertinent  Medical History   Retts Syndrome, seizure disorder, cerebral palsy, and chronic hypoxic respiratory failure on 0.5-1L [place] at baseline  Significant Hospital Events: Including procedures, antibiotic start and stop dates in addition to other pertinent events   11/22 Presented at PCP recommendation for management of CAP. CTA chest obtained and negative for PE but positive for right sisded PNA and near complete collapse of left in the setting of mucus plug.  11/23 Improved left upper lobe aeration  Interim History / Subjective:  Intermittently required up to 5L O2, weaned back to 3L at 100%  Objective   Blood pressure (!) 111/58, pulse (!) 104, temperature 99.1 F (37.3 C), temperature source Axillary, resp. rate (!) 35, height 4\' 11"  (1.499 m), weight 46 kg, SpO2 97%.        Intake/Output Summary (Last 24 hours) at 10/11/2023 0807 Last data filed at 10/11/2023 0750 Gross per 24 hour  Intake 2290.98 ml  Output --  Net 2290.98 ml   Filed Weights   10/09/23 1217 10/09/23  1438  Weight: 45.8 kg 46 kg   Physical Exam: General: Rett's syndrom, chronically ill-appearing, no acute distress, nonverbal at baseline HENT: [place], AT, OP clear, MMM Eyes: EOMI, no scleral icterus Respiratory: Diminished but improved left upper air entry.  No crackles, wheezing or rales Cardiovascular: RRR, -M/R/G, no JVD GI: BS+, soft, nontender Extremities:-Edema,-tenderness Neuro: Awake and turns head to voice, does not follow commands GU: Diaper  Imaging, labs and test in EMR in the last 24 hours reviewed independently by me. Pertinent findings below:  WBC 9.8 CXR Improved left upper lobe aeration. Radiology comments on pleural effusion however bedside ultrasound with no visualized effusion on left thorax  Resolved Hospital Problem list     Assessment & Plan:  Acute on chronic hypoxic and hypercapnic respiratory failure  -At baseline uses 0.5-1L , ranging from 3-5L, currently 3L at 100% Community acquired pneumonia present on admission Left mucus plug in the setting of multiple musculoskeletal dysfunctions -CTA chest obtained and negative for PE but positive for right sisded PNA and near complete collapse of left in the setting of mucus plug.   Patient would benefit from aggressive pulmonary toilet including CPT, nebulizers, hypertonic saline. Treatment of underlying pneumonia with antibiotics. Discussed steroids to decrease inflammation and mucous production however mother would prefer to try above measures first. Patient has previously required NIV during acute illness with sepsis in the past. Has device at home but has not recently needed it. Her borderline hypercarbia may  benefit from it especially if she is more lethargic but will focus on pulmonary toilet first. Currently only requires 4-6L O2 and 100%, wean for goal SpO2 >88%. Bronchoscopy could help in setting of urgent oxygenation issues however mucous plugging will likely recur and risks vs benefit of procedure with  moderate sedation should be considered if pursued. Will opt with conservative measures for now since lower O2 requirement at this time.   P: Wean oxygen as needed for sats greater than 88% Aggressive pulmonary toilet with scheduled nebs, hypertonic saline, CPT q2h when awake NT suctioning PRN Head of bed elevated 30 degrees As needed BIPAP Follow intermittent chest x-ray and ABG as needed  Ensure adequate pulmonary hygiene  Follow cultures  Aspiration precautions    Best Practice (right click and "Reselect all SmartList Selections" daily)  Per primary    Critical care time: NA   Updated father at bedside. Ultrasound at bedside Care Time: 50 min  Mechele Collin, M.D. [facility] Pulmonary/Critical Care Medicine 10/11/2023 8:07 AM   See Amion for personal pager For hours between 7 PM to 7 AM, please call Elink for urgent questions

## 2023-10-11 NOTE — Progress Notes (Signed)
       Overnight   NAME: Zuriyah Atz MRN: [id number] DOB : [birth year]    Date of Service   10/11/2023   HPI/Events of Note    Notified by charge RN/RN for family request to have discussion alongside of pharmacist at bedside.  Family member discussed medication dose/type/route with pharmacy and NP while at bedside with family providing usual dose/route/time documentation of medications specifically for seizure prevention.  Pharmacist has equal accommodation for these medications and in lieu of patient continued to seize frequently, changes made.   Interventions/ Plan   Change of medication to coincide with pharmacy equivalent of patient provide medication Continue attending previous orders.      Chinita Greenland BSN MSNA MSN ACNPC-AG Acute Care Nurse Practitioner [facility]

## 2023-10-11 NOTE — Progress Notes (Signed)
Initial Nutrition Assessment  DOCUMENTATION CODES:   Not applicable  INTERVENTION:  Bolus  tube feeding via PEG: Osmolite 1.5  bolus feeding 1 carton(218ml) 4 times day Provides 1420 kcal, 60 gm protein, 724 ml free water daily    NUTRITION DIAGNOSIS:   Increased nutrient needs related to chronic illness as evidenced by estimated needs.    GOAL:   Patient will meet greater than or equal to 90% of their needs    MONITOR:   TF tolerance, PO intake  REASON FOR ASSESSMENT:   Consult Assessment of nutrition requirement/status  ASSESSMENT: 27 y.o. F, admitted with sepsis secondary to pneumonia with associated mucous plugging. PMH: Rett's syndrome, seizure, G tube feedings, scoliosis, weight loss.  Unable to talk to pt. All information obtained through EMR and Team.  Per EMR and Dr. Michael Litter note pt has had some improvement in her oral intake.   RN reports that she has not eaten anything since she had a diet order was entered.  She also has a PEG in place where she gets supplemental nutrition of  Osmolite 1.5 1 carton TID providing 1065 kcal, 45g pro, 543 ml fw. With current intake she appears to be not meeting current needs.  MD reached out to and agrees that increase in feeding would be beneficial at this time.  Admit weight: 45.8 kg Current weight: 46 kg  Weight history; 10/09/23 46 kg  01/21/23 44 kg  01/08/23 44.5 kg    Average Meal Intake: 0%  Nutritionally Relevant Medications: Scheduled Meds:  cannabidiol  350 mg Per Tube BID   clonazepam  0.25 mg Per Tube Daily   clonazepam  0.5 mg Per Tube QHS   feeding supplement (OSMOLITE 1.5 CAL)  237 mL Per Tube TID    Continuous Infusions:  sodium chloride 60 mL/hr at 10/11/23 1305   levofloxacin (LEVAQUIN) IV Stopped (10/11/23 1255)   valproate sodium 53.1 mL/hr at 10/11/23 1305     Labs Reviewed    NUTRITION - FOCUSED PHYSICAL EXAM:    Diet Order:   Diet Order             DIET - DYS 1 Room service  appropriate? Yes; Fluid consistency: Thin  Diet effective now                   EDUCATION NEEDS:   Not appropriate for education at this time  Skin:  Skin Assessment: Reviewed RN Assessment  Last BM:  10/11/23  Height:   Ht Readings from Last 1 Encounters:  10/09/23 4\' 11"  (1.499 m)    Weight:   Wt Readings from Last 1 Encounters:  10/09/23 46 kg    Ideal Body Weight:     BMI:  Body mass index is 20.48 kg/m.  Estimated Nutritional Needs:   Kcal:  1400-1600 kcal/d  Protein:  60-75 g/d  Fluid:  >/= 1500 ml/d    Jamelle Haring RDN, LDN Clinical Dietitian  RDN pager # available on Amion

## 2023-10-11 NOTE — Plan of Care (Signed)
  Problem: Clinical Measurements: Goal: Ability to maintain clinical measurements within normal limits will improve Outcome: Progressing Goal: Will remain free from infection Outcome: Progressing Goal: Respiratory complications will improve Outcome: Progressing Goal: Cardiovascular complication will be avoided Outcome: Progressing   Problem: Elimination: Goal: Will not experience complications related to bowel motility Outcome: Progressing Goal: Will not experience complications related to urinary retention Outcome: Progressing   Problem: Safety: Goal: Ability to remain free from injury will improve Outcome: Progressing

## 2023-10-11 NOTE — Progress Notes (Signed)
Progress Note  Patient started having frequent seizures at 1230.  They would occur every hour to 90 minutes.  They would last from 1 minute up to 3 minutes.  They patient would get low in O2 saturation, become Vtach in rhythm, would twitch in her upper extremities, and track left and right with her head.  The patient's mother was with the RN during each seizure.  After the third seizure, PRN Klonopin was given.  MD was notified for other options after the Klonopin did not seem to slow the seizures down.

## 2023-10-11 NOTE — Progress Notes (Signed)
PCCM Progress Note  Patient has not had significant mucous production despite pulmonary toilet. Discussed with mom at bedside and she would like to consider bronchoscopy. Will make patient NPO at midnight and tentatively plan for procedure pending availability. Mom will think overnight whether she would like to pursue the procedure in the morning.

## 2023-10-11 NOTE — Patient Instructions (Signed)
Community-Acquired Pneumonia, Adult  Pneumonia is a lung infection that causes inflammation and the buildup of mucus and fluids in the lungs. This may cause coughing and difficulty breathing. Community-acquired pneumonia is pneumonia that develops in people who are not, and have not recently been, in a hospital or other health care facility.  Usually, pneumonia develops as a result of an illness that is caused by a virus, such as the common cold and the flu (influenza). It can also be caused by bacteria or fungi. While the common cold and influenza can pass from person to person (are contagious), pneumonia itself is not considered contagious.  What are the causes?    This condition may be caused by:  Viruses.  Bacteria.  Fungi.  What increases the risk?  The following factors may make you more likely to develop this condition:  Being over age 8 or having certain medical conditions, such as:  A long-term (chronic) disease, such as: chronic obstructive pulmonary disease (COPD), asthma, heart failure, diabetes, or kidney disease.  A condition that increases the risk of breathing in (aspirating) mucus and other fluids from your mouth and nose.  A weakened body defense system (immune system).  Having had your spleen removed (splenectomy). The spleen is the organ that helps fight germs and infections.  Not cleaning your teeth and gums well (poor dental hygiene).  Using tobacco products.  Traveling to places where germs that cause pneumonia are present or being near certain animals or animal habitats that could have germs that cause pneumonia.  What are the signs or symptoms?  Symptoms of this condition include:  A dry cough or a wet (productive) cough.  A fever, sweating, or chills.  Chest pain, especially when breathing deeply or coughing.  Fast breathing, difficulty breathing, or shortness of breath.  Tiredness (fatigue) and muscle aches.  How is this diagnosed?    This condition may be diagnosed based on your medical  history or a physical exam. You may also have tests, including:  Imaging, such as a chest X-ray or lung ultrasound.  Tests of:  The level of oxygen and other gases in your blood.  Mucus from your lungs (sputum).  Fluid around your lungs (pleural fluid).  Your urine.  How is this treated?  Treatment for this condition depends on many factors, such as the cause of your pneumonia, your medicines, and other medical conditions that you have.  For most adults, pneumonia may be treated at home. In some cases, treatment must happen in a hospital and may include:  Medicines that are given by mouth (orally) or through an IV, including:  Antibiotic medicines, if bacteria caused the pneumonia.  Medicines that kill viruses (antiviral medicines), if a virus caused the pneumonia.  Oxygen therapy.  Severe pneumonia, although rare, may require the following treatments:  Mechanical ventilation.This procedure uses a machine to help you breathe if you cannot breathe well on your own or maintain a safe level of blood oxygen.  Thoracentesis. This procedure removes any buildup of pleural fluid to help with breathing.  Follow these instructions at home:    Medicines  Take over-the-counter and prescription medicines only as told by your health care provider.  Take cough medicine only if you have trouble sleeping. Cough medicine can prevent your body from removing mucus from your lungs.  If you were prescribed antibiotics, take them as told by your health care provider. Do not stop taking the antibiotic even if you start to feel better.  Lifestyle  Do not drink alcohol.  Do not use any products that contain nicotine or tobacco. These products include cigarettes, chewing tobacco, and vaping devices, such as e-cigarettes. If you need help quitting, ask your health care provider.  Eat a healthy diet. This includes plenty of vegetables, fruits, whole grains, low-fat dairy products, and lean protein.  General instructions  Rest a lot and  get at least 8 hours of sleep each night.  Sleep in a partly upright position at night. Place a few pillows under your head or sleep in a reclining chair.  Return to your normal activities as told by your health care provider. Ask your health care provider what activities are safe for you.  Drink enough fluid to keep your urine pale yellow. This helps to thin the mucus in your lungs.  If your throat is sore, gargle with a mixture of salt and water 3-4 times a day or as needed. To make salt water, completely dissolve -1 tsp (3-6 g) of salt in 1 cup (237 mL) of warm water.  Keep all follow-up visits.  How is this prevented?  You can lower your risk of developing community-acquired pneumonia by:  Getting the pneumonia vaccine. There are different types and schedules of pneumonia vaccines. Ask your health care provider which option is best for you. Consider getting the pneumonia vaccine if:  You are older than 27 years of age.  You are 78-57 years of age and are receiving cancer treatment, have chronic lung disease, or have other medical conditions that affect your immune system. Ask your health care provider if this applies to you.  Getting your influenza vaccine every year. Ask your health care provider which type of vaccine is best for you.  Getting regular dental checkups.  Washing your hands often with soap and water for at least 20 seconds. If soap and water are not available, use hand sanitizer.  Contact a health care provider if:  You have a fever.  You have trouble sleeping because you cannot control your cough with cough medicine.  Get help right away if:  Your shortness of breath becomes worse.  Your chest pain increases.  Your sickness becomes worse, especially if you are an older adult or have a weak immune system.  You cough up blood.  These symptoms may be an emergency. Get help right away. Call 911.  Do not wait to see if the symptoms will go away.  Do not drive yourself to the  hospital.  Summary  Pneumonia is an infection of the lungs.  Community-acquired pneumonia develops in people who have not been in the hospital. It can be caused by bacteria, viruses, or fungi.  This condition may be treated with antibiotics or antiviral medicines.  Severe pneumonia may require a hospital stay and treatment to help with breathing.  This information is not intended to replace advice given to you by your health care provider. Make sure you discuss any questions you have with your health care provider.  Document Revised: 01/01/2022 Document Reviewed: 01/01/2022  Elsevier Patient Education  2024 [employer].

## 2023-10-11 NOTE — Progress Notes (Signed)
PROGRESS NOTE    Jo Matthews  ION:[id number] DOB: [birth year] DOA: 10/09/2023 PCP: Georgiann Hahn, MD   Brief Narrative: Jo Matthews is a 27 y.o. female with a history of Rett syndrome, severe sclerosis, seizure disorder, chronic respiratory failure with hypoxia.  Patient presented secondary to hypoxia and found to have sepsis secondary to pneumonia with associated mucous plugging. Levaquin started. Pulmonology consulted. No bronchoscopy recommended, but patient started on conservative treatment with aggressive pulmonary toilet with nebulizer, hypertonic saline and chest PT treatments.   Assessment and Plan:  Sepsis Present on admission and secondary to pneumonia. Patient with leukocytosis and fever with associated acute respiratory failure. Blood cultures obtained on admission and are no growth to date. Empiric antibiotics started for treatment of pneumonia. -Continue antibiotics and follow-up blood culture data  Community acquired pneumonia Present on admission. Associated leukocytosis. CT imaging significant for new airspace disease of the right lower lobe pneumonia. Patient started empirically on Levaquin. -Continue Levaquin -Sputum culture pending  Acute on chronic respiratory failure with hypoxia Secondary to pneumonia and mucous plugging. Patient uses about 0.5 - 1L/min baseline. Patient requiring up to 5 L/min this admission. Weaned to 3 L/min today -Continue weaning to baseline 1 L/min as able  Mucous plugging Contributing to respiratory failure. Recommendation for aggressive pulmonary toilet. No bronchoscopy recommended at this time. -Continue chest PT, hypertonic saline nebulizer treatments, Mucomyst  Chronic anemia Hemoglobin is stable at baseline.  Hepatomegaly Hepatic steatosis Noted.  Cardiomegaly Noted on CT imaging. No history of heart failure. Presentation not consistent with acute heart failure. Recommend outpatient workup.  Seizure disorder Short  seizure overnight. -Transition to home Keppra and Depakote per tube -Diazepam BID and as needed for seizures   DVT prophylaxis: Lovenox Code Status:   Code Status: Full Code Family Communication: Father at bedside Disposition Plan: Discharge home likely in 2-4 days pending ability to wean oxygen to baseline, improvement in pneumonia with ability to transition to outpatient regimen, ongoing pulmonology recommendations   Consultants:  Pulmonology  Procedures:  None  Antimicrobials: Levaquin    Subjective: Per father, patient has been doing better. She was alert yesterday. She ate some, but not much. No other concerns. Tmax of 100.5 F overnight.  Objective: BP (!) 112/57 (BP Location: Right Leg)   Pulse (!) 103   Temp (!) 100.5 F (38.1 C) (Axillary) Comment: RN notified  Resp 20   Ht 4\' 11"  (1.499 m)   Wt 46 kg   SpO2 96%   BMI 20.48 kg/m   Examination:  General exam: Appears calm and comfortable Respiratory system: Diminished with some coarse breath sounds. Respiratory effort normal. Cardiovascular system: S1 & S2 heard, RRR. Gastrointestinal system: Abdomen is distended, soft and nontender. Normal bowel sounds heard. Musculoskeletal: No edema. No calf tenderness   Data Reviewed: I have personally reviewed following labs and imaging studies  CBC Lab Results  Component Value Date   WBC 9.8 10/11/2023   RBC 2.82 (L) 10/11/2023   HGB 9.4 (L) 10/11/2023   HCT 30.5 (L) 10/11/2023   MCV 108.2 (H) 10/11/2023   MCH 33.3 10/11/2023   PLT 187 10/11/2023   MCHC 30.8 10/11/2023   RDW 12.8 10/11/2023   LYMPHSABS 3,330 12/26/2022   MONOABS 0.4 05/03/2021   EOSABS 259 12/26/2022   BASOSABS 52 12/26/2022     Last metabolic panel Lab Results  Component Value Date   NA 139 10/10/2023   K 3.7 10/10/2023   CL 95 (L) 10/10/2023   CO2 37 (H) 10/10/2023  BUN 9 10/10/2023   CREATININE <0.30 (L) 10/10/2023   GLUCOSE 86 10/10/2023   GFRNONAA NOT CALCULATED  10/10/2023   GFRAA >60 08/05/2017   CALCIUM 9.0 10/10/2023   PHOS 2.8 12/06/2014   PROT 6.6 10/09/2023   ALBUMIN 3.1 (L) 10/09/2023   BILITOT 0.3 10/09/2023   ALKPHOS 45 10/09/2023   AST 18 10/09/2023   ALT 12 10/09/2023   ANIONGAP 7 10/10/2023    GFR: CrCl cannot be calculated (This lab value cannot be used to calculate CrCl because it is not a number: <0.30).  Recent Results (from the past 240 hour(s))  Resp panel by RT-PCR (RSV, Flu A&B, Covid) Anterior Nasal Swab     Status: None   Collection Time: 10/09/23  1:10 PM   Specimen: Anterior Nasal Swab  Result Value Ref Range Status   SARS Coronavirus 2 by RT PCR NEGATIVE NEGATIVE Final    Comment: (NOTE) SARS-CoV-2 target nucleic acids are NOT DETECTED.  The SARS-CoV-2 RNA is generally detectable in upper respiratory specimens during the acute phase of infection. The lowest concentration of SARS-CoV-2 viral copies this assay can detect is 138 copies/mL. A negative result does not preclude SARS-Cov-2 infection and should not be used as the sole basis for treatment or other patient management decisions. A negative result may occur with  improper specimen collection/handling, submission of specimen other than nasopharyngeal swab, presence of viral mutation(s) within the areas targeted by this assay, and inadequate number of viral copies(<138 copies/mL). A negative result must be combined with clinical observations, patient history, and epidemiological information. The expected result is Negative.  Fact Sheet for Patients:  BloggerCourse.com  Fact Sheet for Healthcare Providers:  SeriousBroker.it  This test is no t yet approved or cleared by the [place] FDA and  has been authorized for detection and/or diagnosis of SARS-CoV-2 by FDA under an Emergency Use Authorization (EUA). This EUA will remain  in effect (meaning this test can be used) for the duration of  the COVID-19 declaration under Section 564(b)(1) of the Act, 21 U.S.C.section 360bbb-3(b)(1), unless the authorization is terminated  or revoked sooner.       Influenza A by PCR NEGATIVE NEGATIVE Final   Influenza B by PCR NEGATIVE NEGATIVE Final    Comment: (NOTE) The Xpert Xpress SARS-CoV-2/FLU/RSV plus assay is intended as an aid in the diagnosis of influenza from Nasopharyngeal swab specimens and should not be used as a sole basis for treatment. Nasal washings and aspirates are unacceptable for Xpert Xpress SARS-CoV-2/FLU/RSV testing.  Fact Sheet for Patients: BloggerCourse.com  Fact Sheet for Healthcare Providers: SeriousBroker.it  This test is not yet approved or cleared by the [place] FDA and has been authorized for detection and/or diagnosis of SARS-CoV-2 by FDA under an Emergency Use Authorization (EUA). This EUA will remain in effect (meaning this test can be used) for the duration of the COVID-19 declaration under Section 564(b)(1) of the Act, 21 U.S.C. section 360bbb-3(b)(1), unless the authorization is terminated or revoked.     Resp Syncytial Virus by PCR NEGATIVE NEGATIVE Final    Comment: (NOTE) Fact Sheet for Patients: BloggerCourse.com  Fact Sheet for Healthcare Providers: SeriousBroker.it  This test is not yet approved or cleared by the [place] FDA and has been authorized for detection and/or diagnosis of SARS-CoV-2 by FDA under an Emergency Use Authorization (EUA). This EUA will remain in effect (meaning this test can be used) for the duration of the COVID-19 declaration under Section 564(b)(1) of the Act, 21 U.S.C.  section 360bbb-3(b)(1), unless the authorization is terminated or revoked.  Performed at [facility], [address] [place], [place] [address]   Blood culture (routine x 2)     Status: None (Preliminary  result)   Collection Time: 10/09/23  1:20 PM   Specimen: BLOOD  Result Value Ref Range Status   Specimen Description   Final    BLOOD RIGHT ANTECUBITAL Performed at [facility], [address] [place], [place] [address]    Special Requests   Final    BOTTLES DRAWN AEROBIC AND ANAEROBIC Blood Culture results may not be optimal due to an excessive volume of blood received in culture bottles Performed at [facility], [address] [place], [place] [address]    Culture   Final    NO GROWTH 2 DAYS Performed at [facility], [address], [place], [place] [address]    Report Status PENDING  Incomplete  Blood culture (routine x 2)     Status: None (Preliminary result)   Collection Time: 10/09/23  1:30 PM   Specimen: BLOOD  Result Value Ref Range Status   Specimen Description   Final    BLOOD LEFT ANTECUBITAL Performed at [facility], [address], [place], [place] [address]    Special Requests   Final    BOTTLES DRAWN AEROBIC AND ANAEROBIC Blood Culture adequate volume Performed at [facility], [address] [place], [place] [address]    Culture   Final    NO GROWTH 2 DAYS Performed at [facility], [address] [place], [place] [address]    Report Status PENDING  Incomplete      Radiology Studies: DG CHEST PORT 1 VIEW  Result Date: 10/10/2023 CLINICAL DATA:  27 year old female with increased oxygen requirement. Complete left lung atelectasis on CTA yesterday, suspected mucous plugging. EXAM: PORTABLE CHEST 1 VIEW COMPARISON:  CTA chest and radiographs yesterday. FINDINGS: Portable AP semi upright view at 0611 hours.Ongoing dense perihilar and bibasilar lung opacification. But peripheral left upper lung ventilation has improved since 1119 hours yesterday. Improved visualization of airways at the bifurcation of the left mainstem. Right lung base opacity is more veiling now. Most  mediastinal contours are obscured. Small volume pleural effusion also visible in the left lung apex. Visualized tracheal air column is within normal limits. Extensive posterior spinal rod fusion hardware is stable. Paucity of bowel gas. IMPRESSION: 1. Ongoing bilateral lung opacification, but left upper lobe ventilation has improved since 1119 hours yesterday. Left mainstem bronchus patency appears improved. 2. Evidence of bilateral pleural effusions, new since yesterday. Electronically Signed   By: Odessa Fleming M.D.   On: 10/10/2023 06:33   CT Angio Chest Pulmonary Embolism (PE) W or WO Contrast  Result Date: 10/09/2023 CLINICAL DATA:  Increased oxygen requirement. EXAM: CT ANGIOGRAPHY CHEST WITH CONTRAST TECHNIQUE: Multidetector CT imaging of the chest was performed using the standard protocol during bolus administration of intravenous contrast. Multiplanar CT image reconstructions and MIPs were obtained to evaluate the vascular anatomy. RADIATION DOSE REDUCTION: This exam was performed according to the departmental dose-optimization program which includes automated exposure control, adjustment of the mA and/or kV according to patient size and/or use of iterative reconstruction technique. CONTRAST:  75mL OMNIPAQUE IOHEXOL 350 MG/ML SOLN COMPARISON:  Chest x-ray earlier today. Prior CTA of the chest on 08/05/2017 FINDINGS: Cardiovascular: The pulmonary arteries are adequately opacified. There is no evidence of pulmonary embolism. Central pulmonary arteries  are not dilated. The thoracic aorta is normal in caliber. The heart is mild-to-moderately enlarged. No pericardial fluid present. No visible calcified coronary artery plaque. Mediastinum/Nodes: No enlarged mediastinal, hilar, or axillary lymph nodes. Thyroid gland, trachea, and esophagus demonstrate no significant findings. Lungs/Pleura: New complete atelectasis of the left lung with cut off of the left mainstem bronchus consistent with occlusion, likely related  to mucous plug. New airspace disease of the right lower lobe is consistent with pneumonia. No associated pleural effusions. No pneumothorax. The right upper lobe and middle lobe are normally aerated. Upper Abdomen: Hepatomegaly and hepatic steatosis. Musculoskeletal: Extensive spinal fusion hardware again noted. Review of the MIP images confirms the above findings. IMPRESSION: 1. No evidence of pulmonary embolism. 2. New complete atelectasis of the left lung with cut off of the left mainstem bronchus consistent with occlusion, likely related to mucous plug. 3. New airspace disease of the right lower lobe is consistent with pneumonia. 4. Mild-to-moderate cardiomegaly. 5. Hepatomegaly and hepatic steatosis. Electronically Signed   By: Irish Lack M.D.   On: 10/09/2023 16:12   DG Chest 2 View  Result Date: 10/09/2023 CLINICAL DATA:  Fever and cough.  Evaluate for pneumonia. EXAM: CHEST - 2 VIEW COMPARISON:  Chest radiographs 01/20/2023, 05/20/2022, 05/01/2021 FINDINGS: As on the comparison studies, there is moderate hypoinflation. There is worsened now homogeneous opacification of the majority of the left lung. Moderate heterogeneous opacification of the inferior right lung is new from most recent 05/20/2022 and 01/20/2023 comparison radiographs. The cardiac silhouette and mediastinal contours are not well evaluated given the left lung opacities. No definite pneumothorax is seen. Mild dextrocurvature of the midthoracic spine with extensive thoracic and lumbar posterior rod and screw fusion hardware. Left upper abdominal quadrant gastrostomy tube is noted. IMPRESSION: 1. Worsened now homogeneous opacification of the majority of the left lung. This may represent a combination of pleural effusion, atelectasis, and/or pneumonia. 2. Moderate heterogeneous opacification of the inferior right lung is new from most recent 05/20/2022 and 01/20/2023 comparison radiographs. This also may represent atelectasis and/or  pneumonia. Electronically Signed   By: Neita Garnet M.D.   On: 10/09/2023 11:50      LOS: 2 days    Jacquelin Hawking, MD [facility] 10/11/2023, 8:59 AM   If 7PM-7AM, please contact night-coverage [email or web address]

## 2023-10-11 NOTE — Progress Notes (Signed)
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 Symptoms began 2 days ago and there has been little improvement since that time. Associated symptoms include: fever. Patient denies wheezing.  Patient has a history of  significant respiratory compromise and developmental delays-rett syndrome .    Patient denies having tobacco smoke exposure.  The following portions of the patient's history were reviewed and updated as appropriate: allergies, current medications, past family history, past medical history, past social history, past surgical history, and problem list.  Review of Systems Pertinent items are noted in HPI    Objective:    Wt 101 lb (45.8 kg)   BMI 20.40 kg/m   Oxygen saturation 89% on 4 liters/min via Patient connected to nasal cannula oxygen General: fatigued, flushed, and baseline non verbal status  without apparent respiratory distress.  Cyanosis: absent  Grunting: absent  Nasal flaring: absent  Retractions: present intercostally  HEENT:  ENT exam normal, no neck nodes or sinus tenderness  Neck: no adenopathy and supple, symmetrical, trachea midline  Lungs: diminished breath sounds bilaterally  Heart: regular rate and rhythm, S1, S2 normal, no murmur, click, rub or gallop  Extremities:  Spastic quadriplegia     Neurological: Non verbal with developmental delays   Imaging Heterogeneous airspace opacity of the bilateral lower lobes,as well as upper left lobe concerning for infection.      Assessment:    Pneumonia diffusely throughout both lungs. Multifocal pneumonia involving RIGHT upper and RIGHT lower lobes and probably LEFT perihilar.   Plan:    Stable but due to poor po intake complex medical issues and bilateral  pneumonia so will admit to hospital for IV hydration/IV antibiotics and close monitoring. Admitting via ER at [facility] --left in stable condition

## 2023-10-12 ENCOUNTER — Inpatient Hospital Stay (HOSPITAL_COMMUNITY): Payer: Medicaid Other

## 2023-10-12 ENCOUNTER — Encounter (HOSPITAL_COMMUNITY): Payer: Self-pay | Admitting: Anesthesiology

## 2023-10-12 ENCOUNTER — Encounter (HOSPITAL_COMMUNITY)
Admission: EM | Disposition: A | Discharge status: Home / Self Care | Payer: Self-pay | Source: Home / Self Care | Attending: Internal Medicine

## 2023-10-12 DIAGNOSIS — F842 Rett's syndrome: Secondary | ICD-10-CM | POA: Diagnosis not present

## 2023-10-12 DIAGNOSIS — G40919 Epilepsy, unspecified, intractable, without status epilepticus: Secondary | ICD-10-CM

## 2023-10-12 DIAGNOSIS — Z151 Genetic susceptibility to epilepsy and neurodevelopmental disorders: Secondary | ICD-10-CM | POA: Diagnosis not present

## 2023-10-12 DIAGNOSIS — R569 Unspecified convulsions: Secondary | ICD-10-CM

## 2023-10-12 DIAGNOSIS — J9621 Acute and chronic respiratory failure with hypoxia: Secondary | ICD-10-CM | POA: Diagnosis not present

## 2023-10-12 LAB — CBC
HCT: 32.8 % — ABNORMAL LOW (ref 36.0–46.0)
Hemoglobin: 10 g/dL — ABNORMAL LOW (ref 12.0–15.0)
MCH: 33.2 pg (ref 26.0–34.0)
MCHC: 30.5 g/dL (ref 30.0–36.0)
MCV: 109 fL — ABNORMAL HIGH (ref 80.0–100.0)
Platelets: 202 10*3/uL (ref 150–400)
RBC: 3.01 MIL/uL — ABNORMAL LOW (ref 3.87–5.11)
RDW: 12.5 % (ref 11.5–15.5)
WBC: 8.4 10*3/uL (ref 4.0–10.5)
nRBC: 0 % (ref 0.0–0.2)

## 2023-10-12 LAB — RESPIRATORY PANEL BY PCR

## 2023-10-12 LAB — COMPREHENSIVE METABOLIC PANEL
ALT: 11 U/L (ref 0–44)
AST: 17 U/L (ref 15–41)
Albumin: 2.9 g/dL — ABNORMAL LOW (ref 3.5–5.0)
Alkaline Phosphatase: 38 U/L (ref 38–126)
Anion gap: 5 (ref 5–15)
BUN: <5 mg/dL — ABNORMAL LOW (ref 6–20)
CO2: 43 mmol/L — ABNORMAL HIGH (ref 22–32)
Calcium: 8.6 mg/dL — ABNORMAL LOW (ref 8.9–10.3)
Chloride: 93 mmol/L — ABNORMAL LOW (ref 98–111)
Creatinine, Ser: <0.3 mg/dL — ABNORMAL LOW (ref 0.44–1.00)
Glucose, Bld: 116 mg/dL — ABNORMAL HIGH (ref 70–99)
Potassium: 3.5 mmol/L (ref 3.5–5.1)
Sodium: 141 mmol/L (ref 135–145)
Total Bilirubin: 0.4 mg/dL (ref <1.2)
Total Protein: 6.3 g/dL — ABNORMAL LOW (ref 6.5–8.1)

## 2023-10-12 SURGERY — VIDEO BRONCHOSCOPY WITHOUT FLUORO
Anesthesia: Monitor Anesthesia Care

## 2023-10-12 MED ORDER — IPRATROPIUM BROMIDE 0.02 % IN SOLN
0.5000 mg | Freq: Four times a day (QID) | RESPIRATORY_TRACT | Status: DC
  Administered 2023-10-12 – 2023-10-17 (×21): 0.5 mg via RESPIRATORY_TRACT
  Filled 2023-10-12 (×21): qty 2.5

## 2023-10-12 MED ORDER — IBUPROFEN 100 MG/5ML PO SUSP
400.0000 mg | Freq: Four times a day (QID) | ORAL | Status: DC | PRN
  Administered 2023-10-12 – 2023-10-15 (×2): 400 mg
  Filled 2023-10-12 (×2): qty 20

## 2023-10-12 MED ORDER — SODIUM CHLORIDE 0.45 % IV SOLN
INTRAVENOUS | Status: AC
Start: 2023-10-12 — End: 2023-10-13

## 2023-10-12 MED ORDER — LEVALBUTEROL HCL 0.63 MG/3ML IN NEBU: 0.6300 mg | INHALATION_SOLUTION | Freq: Four times a day (QID) | RESPIRATORY_TRACT | Status: DC

## 2023-10-12 MED ORDER — SALINE SPRAY 0.65 % NA SOLN
1.0000 | NASAL | Status: DC | PRN
  Administered 2023-10-12: 1 via NASAL
  Filled 2023-10-12: qty 44

## 2023-10-12 MED ORDER — PERAMPANEL 0.5 MG/ML PO SUSP
4.0000 mL | Freq: Every day | ORAL | Status: DC
  Administered 2023-10-12: 4 mL via ORAL
  Filled 2023-10-12: qty 2
  Filled 2023-10-12: qty 4

## 2023-10-12 MED ORDER — ACETYLCYSTEINE 20 % IN SOLN
4.0000 mL | Freq: Four times a day (QID) | RESPIRATORY_TRACT | Status: DC
  Administered 2023-10-13 (×3): 4 mL via RESPIRATORY_TRACT
  Filled 2023-10-12 (×2): qty 4

## 2023-10-12 MED ORDER — LEVALBUTEROL HCL 0.63 MG/3ML IN NEBU
0.6300 mg | INHALATION_SOLUTION | Freq: Four times a day (QID) | RESPIRATORY_TRACT | Status: DC
  Administered 2023-10-12 – 2023-10-17 (×21): 0.63 mg via RESPIRATORY_TRACT
  Filled 2023-10-12 (×21): qty 3

## 2023-10-12 MED ORDER — IPRATROPIUM-ALBUTEROL 0.5-2.5 (3) MG/3ML IN SOLN
3.0000 mL | RESPIRATORY_TRACT | Status: DC | PRN
  Administered 2023-10-18: 3 mL via RESPIRATORY_TRACT
  Filled 2023-10-12: qty 3

## 2023-10-12 NOTE — Progress Notes (Addendum)
PROGRESS NOTE    Jo Matthews  ZOX:[id number] DOB: [birth year] DOA: 10/09/2023 PCP: Georgiann Hahn, MD   Brief Narrative: Jo Matthews is a 27 y.o. female with a history of Rett syndrome, severe sclerosis, seizure disorder, chronic respiratory failure with hypoxia.  Patient presented secondary to hypoxia and found to have sepsis secondary to pneumonia with associated mucous plugging. Levaquin started. Pulmonology consulted. No bronchoscopy recommended, but patient started on conservative treatment with aggressive pulmonary toilet with nebulizer, hypertonic saline and chest PT treatments.   Assessment and Plan:  Sepsis Present on admission and secondary to pneumonia. Patient with leukocytosis and fever with associated acute respiratory failure. Blood cultures obtained on admission and are no growth to date. Empiric antibiotics started for treatment of pneumonia. -Continue antibiotics and follow-up blood culture data  Community acquired pneumonia Present on admission. Associated leukocytosis. CT imaging significant for new airspace disease of the right lower lobe pneumonia. Patient started empirically on Levaquin. -Continue Levaquin -Sputum culture pending  Acute on chronic respiratory failure with hypoxia Secondary to pneumonia and mucous plugging. Patient uses about 0.5 - 1L/min baseline. Patient requiring up to 5 L/min this admission. Weaned to 3 L/min but now back up to 4 L/min. Chest x-ray with worsened airspace disease of left upper lobe. Pulmonology planning bronchoscopy today. -Continue weaning to baseline 1 L/min as able  Mucous plugging Contributing to respiratory failure. Recommendation for aggressive pulmonary toilet. No bronchoscopy recommended at this time. -Continue chest PT, hypertonic saline nebulizer treatments, Mucomyst  Chronic anemia Hemoglobin is stable at baseline.  Hepatomegaly Hepatic steatosis Noted.  Cardiomegaly Noted on CT imaging. No history of  heart failure. Presentation not consistent with acute heart failure. Recommend outpatient workup.  Dysphagia -Continue dysphagia 1 diet -Continue feeding supplement per tube  Seizure disorder Rett syndrome Patient with multiple breakthrough seizures while on home medications. Complicating ability to obtain bronchoscopy. Discussed with neurology who recommended transfer to [facility] for LTM EEG. -Continue Keppra, Depakote perampanel -If patient continues to have breakthrough seizures, will consult neurology for recommendations -Continue Diazepam/Klonopin PRN for seizures   DVT prophylaxis: Lovenox Code Status:   Code Status: Full Code Family Communication: Father at bedside Disposition Plan: Discharge home likely in 2-4 days pending ability to wean oxygen to baseline, improvement in pneumonia with ability to transition to outpatient regimen, ongoing pulmonology recommendations   Consultants: Pulmonology Neurology  Procedures:  None  Antimicrobials: Levaquin    Subjective: Multiple seizures overnight and yesterday afternoon per mother. Afebrile for the past 24 hours. Did not tolerate oral intake mostly because of seizures per mother.  Objective: BP 123/77   Pulse (!) 110   Temp 98.8 F (37.1 C) (Axillary)   Resp (!) 31   Ht 4\' 11"  (1.499 m)   Wt 46 kg   SpO2 98%   BMI 20.48 kg/m   Examination:  General exam: Appears calm and comfortable Respiratory system: Diminished with some coarse breath sounds. Respiratory effort normal. Cardiovascular system: S1 & S2 heard, RRR. Gastrointestinal system: Abdomen is distended, soft and nontender. Normal bowel sounds heard. Musculoskeletal: No edema. No calf tenderness   Data Reviewed: I have personally reviewed following labs and imaging studies  CBC Lab Results  Component Value Date   WBC 8.4 10/12/2023   RBC 3.01 (L) 10/12/2023   HGB 10.0 (L) 10/12/2023   HCT 32.8 (L) 10/12/2023   MCV 109.0 (H) 10/12/2023    MCH 33.2 10/12/2023   PLT 202 10/12/2023   MCHC 30.5 10/12/2023   RDW  12.5 10/12/2023   LYMPHSABS 3,330 12/26/2022   MONOABS 0.4 05/03/2021   EOSABS 259 12/26/2022   BASOSABS 52 12/26/2022     Last metabolic panel Lab Results  Component Value Date   NA 139 10/10/2023   K 3.7 10/10/2023   CL 95 (L) 10/10/2023   CO2 37 (H) 10/10/2023   BUN 9 10/10/2023   CREATININE <0.30 (L) 10/10/2023   GLUCOSE 86 10/10/2023   GFRNONAA NOT CALCULATED 10/10/2023   GFRAA >60 08/05/2017   CALCIUM 9.0 10/10/2023   PHOS 2.8 12/06/2014   PROT 6.6 10/09/2023   ALBUMIN 3.1 (L) 10/09/2023   BILITOT 0.3 10/09/2023   ALKPHOS 45 10/09/2023   AST 18 10/09/2023   ALT 12 10/09/2023   ANIONGAP 7 10/10/2023    GFR: CrCl cannot be calculated (This lab value cannot be used to calculate CrCl because it is not a number: <0.30).  Recent Results (from the past 240 hour(s))  Resp panel by RT-PCR (RSV, Flu A&B, Covid) Anterior Nasal Swab     Status: None   Collection Time: 10/09/23  1:10 PM   Specimen: Anterior Nasal Swab  Result Value Ref Range Status   SARS Coronavirus 2 by RT PCR NEGATIVE NEGATIVE Final    Comment: (NOTE) SARS-CoV-2 target nucleic acids are NOT DETECTED.  The SARS-CoV-2 RNA is generally detectable in upper respiratory specimens during the acute phase of infection. The lowest concentration of SARS-CoV-2 viral copies this assay can detect is 138 copies/mL. A negative result does not preclude SARS-Cov-2 infection and should not be used as the sole basis for treatment or other patient management decisions. A negative result may occur with  improper specimen collection/handling, submission of specimen other than nasopharyngeal swab, presence of viral mutation(s) within the areas targeted by this assay, and inadequate number of viral copies(<138 copies/mL). A negative result must be combined with clinical observations, patient history, and epidemiological information. The expected  result is Negative.  Fact Sheet for Patients:  BloggerCourse.com  Fact Sheet for Healthcare Providers:  SeriousBroker.it  This test is no t yet approved or cleared by the [place] FDA and  has been authorized for detection and/or diagnosis of SARS-CoV-2 by FDA under an Emergency Use Authorization (EUA). This EUA will remain  in effect (meaning this test can be used) for the duration of the COVID-19 declaration under Section 564(b)(1) of the Act, 21 U.S.C.section 360bbb-3(b)(1), unless the authorization is terminated  or revoked sooner.       Influenza A by PCR NEGATIVE NEGATIVE Final   Influenza B by PCR NEGATIVE NEGATIVE Final    Comment: (NOTE) The Xpert Xpress SARS-CoV-2/FLU/RSV plus assay is intended as an aid in the diagnosis of influenza from Nasopharyngeal swab specimens and should not be used as a sole basis for treatment. Nasal washings and aspirates are unacceptable for Xpert Xpress SARS-CoV-2/FLU/RSV testing.  Fact Sheet for Patients: BloggerCourse.com  Fact Sheet for Healthcare Providers: SeriousBroker.it  This test is not yet approved or cleared by the [place] FDA and has been authorized for detection and/or diagnosis of SARS-CoV-2 by FDA under an Emergency Use Authorization (EUA). This EUA will remain in effect (meaning this test can be used) for the duration of the COVID-19 declaration under Section 564(b)(1) of the Act, 21 U.S.C. section 360bbb-3(b)(1), unless the authorization is terminated or revoked.     Resp Syncytial Virus by PCR NEGATIVE NEGATIVE Final    Comment: (NOTE) Fact Sheet for Patients: BloggerCourse.com  Fact Sheet for Healthcare Providers: SeriousBroker.it  This test is not yet approved or cleared by the [place] and has been authorized for detection and/or diagnosis of  SARS-CoV-2 by FDA under an Emergency Use Authorization (EUA). This EUA will remain in effect (meaning this test can be used) for the duration of the COVID-19 declaration under Section 564(b)(1) of the Act, 21 U.S.C. section 360bbb-3(b)(1), unless the authorization is terminated or revoked.  Performed at [facility], [address], [place], [place] [address]   Blood culture (routine x 2)     Status: None (Preliminary result)   Collection Time: 10/09/23  1:20 PM   Specimen: BLOOD  Result Value Ref Range Status   Specimen Description   Final    BLOOD RIGHT ANTECUBITAL Performed at [facility], [address] [place], [place] [address]    Special Requests   Final    BOTTLES DRAWN AEROBIC AND ANAEROBIC Blood Culture results may not be optimal due to an excessive volume of blood received in culture bottles Performed at [facility], [address] [place], [place] [address]    Culture   Final    NO GROWTH 2 DAYS Performed at [facility], [address] [place], [place] [address]    Report Status PENDING  Incomplete  Blood culture (routine x 2)     Status: None (Preliminary result)   Collection Time: 10/09/23  1:30 PM   Specimen: BLOOD  Result Value Ref Range Status   Specimen Description   Final    BLOOD LEFT ANTECUBITAL Performed at [facility], [address], [place], [place] [address]    Special Requests   Final    BOTTLES DRAWN AEROBIC AND ANAEROBIC Blood Culture adequate volume Performed at [facility], [address] [place], [place] [address]    Culture   Final    NO GROWTH 2 DAYS Performed at [facility], [address] [place], [place] [address]    Report Status PENDING  Incomplete      Radiology Studies: DG CHEST PORT 1 VIEW  Result Date: 10/11/2023 CLINICAL DATA:  91209 Atelectasis 91209 EXAM: PORTABLE CHEST 1 VIEW COMPARISON:  October 10, 2023, October 09, 2023 FINDINGS: Cardiomediastinal silhouette is obscured.Near complete opacification of the LEFT hemithorax, similar in comparison to November 22nd. Homogeneous opacification of the majority of the RIGHT lower lung, similar comparison to November twenty-third. Status post posterior fixation of the thoracolumbar spine. IMPRESSION: 1. Near complete opacification of the LEFT hemithorax, similar in comparison to November 22nd but worsened since November 23rd. 2. Homogeneous opacification of the majority of the RIGHT lower lung, similar comparison to November twenty-third. Electronically Signed   By: Meda Klinefelter M.D.   On: 10/11/2023 09:59      LOS: 3 days    Jacquelin Hawking, MD [facility] 10/12/2023, 7:54 AM   If 7PM-7AM, please contact night-coverage [email or web address]

## 2023-10-12 NOTE — Anesthesia Preprocedure Evaluation (Signed)
Anesthesia Evaluation  Patient identified by MRN, date of birth, ID band Patient awake    Reviewed: Allergy & Precautions, NPO status , Patient's Chart, lab work & pertinent test results  Airway Mallampati: III  TM Distance: <3 FB Neck ROM: Limited    Dental  (+) Dental Advisory Given   Pulmonary sleep apnea and Oxygen sleep apnea , pneumonia, unresolved   breath sounds clear to auscultation       Cardiovascular negative cardio ROS  Rhythm:Regular Rate:Normal     Neuro/Psych Seizures -,  Rett syndrome  Neuromuscular disease    GI/Hepatic Neg liver ROS,,,G-tube   Endo/Other  negative endocrine ROS    Renal/GU negative Renal ROS     Musculoskeletal   Abdominal   Peds  Hematology negative hematology ROS (+)   Anesthesia Other Findings   Reproductive/Obstetrics                             Anesthesia Physical Anesthesia Plan  ASA: 4  Anesthesia Plan: MAC   Post-op Pain Management:    Induction:   PONV Risk Score and Plan: 2 and Propofol infusion and Ondansetron  Airway Management Planned: Natural Airway and Simple Face Mask  Additional Equipment: None  Intra-op Plan:   Post-operative Plan: Possible Post-op intubation/ventilation  Informed Consent: I have reviewed the patients History and Physical, chart, labs and discussed the procedure including the risks, benefits and alternatives for the proposed anesthesia with the patient or authorized representative who has indicated his/her understanding and acceptance.       Plan Discussed with:   Anesthesia Plan Comments:        Anesthesia Quick Evaluation

## 2023-10-12 NOTE — Consult Note (Signed)
NAME:  Jo Matthews, MRN:  [id number], DOB:  1996/05/17, LOS: 3 ADMISSION DATE:  10/09/2023, CONSULTATION DATE:  10/09/2023 REFERRING MD:  Dr. Theresia Lo - EDP, CHIEF COMPLAINT:  Mucus plug    History of Present Illness:  Jo Matthews is a 27 y.o. female with an extensive PMH significant  for Retts Syndrome, seizure disorder, cerebral palsy, and chronic hypoxic respiratory failure on 0.5-1L [place] at baseline who presented to the ED 11/22 for worsening hypoxia, cough, SOB, and fatigue. Patient was seen by PCP who obtained a CXR that was concerning for PNA and prompted family to present to the ED. On ED arrival patient was seen with mild tachycardia with all other vitals WNL on 6L [place]  Patient was admitted per [facility] for management of CAP   CTA chest obtained and negative for PE but positive for right sisded PNA and near complete collapse of left in the setting of mucus plug. PCCM consulted for assistance in management.    Pertinent  Medical History   Retts Syndrome, seizure disorder, cerebral palsy, and chronic hypoxic respiratory failure on 0.5-1L [place] at baseline  Significant Hospital Events: Including procedures, antibiotic start and stop dates in addition to other pertinent events   11/22 Presented at PCP recommendation for management of CAP. CTA chest obtained and negative for PE but positive for right sisded PNA and near complete collapse of left in the setting of mucus plug.  11/23 Improved left upper lobe aeration  Interim History / Subjective:  Continues to intermittently require 3-5L [place] with seizures that would last 1-3 minutes yesterday.  This morning she is alert and engaged. Planned for bronchoscopy however between this morning and procedure time, she had an additional two seizures. Bronchoscopy cancelled. Notified mom at bedside that we would continue conservative measures until breakthrough seizures are better controlled  Objective   Blood pressure 131/85, pulse (!) 111,  temperature 98.2 F (36.8 C), temperature source Axillary, resp. rate (!) 29, height 4\' 11"  (1.499 m), weight 46 kg, SpO2 97%.    FiO2 (%):  [98 %] 98 %   Intake/Output Summary (Last 24 hours) at 10/12/2023 0953 Last data filed at 10/11/2023 2300 Gross per 24 hour  Intake 1074.53 ml  Output --  Net 1074.53 ml   Filed Weights   10/09/23 1217 10/09/23 1438  Weight: 45.8 kg 46 kg   Physical Exam: General: Rett's syndrome, chronically ill-appearing, no acute distress, nonverbal at baseline, intermittently awake then post-ictal HENT: [place], AT, OP clear, MMM Eyes: EOMI, no scleral icterus Respiratory: Diminished left upper air entry.  No crackles, wheezing or rales Cardiovascular: RRR, -M/R/G, no JVD GI: BS+, soft, nontender Extremities:-Edema,-tenderness Neuro: When awake, turns head, responds to voice, moves extremities  Imaging, labs and test in EMR in the last 24 hours reviewed independently by me. Pertinent findings below:  WBC 9.8 CXR 11/25 Complete left opacification  Resolved Hospital Problem list     Assessment & Plan:  Acute on chronic hypoxic and hypercapnic respiratory failure  -At baseline uses 0.5-1L [place], ranging from 3-5L, currently 3L at 100% Community acquired pneumonia present on admission Left mucus plug in the setting of multiple musculoskeletal dysfunctions -CTA chest obtained and negative for PE but positive for right sisded PNA and near complete collapse of left in the setting of mucus plug.   Patient would benefit from aggressive pulmonary toilet including CPT, nebulizers, hypertonic saline. Treatment of underlying pneumonia with antibiotics. Discussed steroids to decrease inflammation and mucous production however mother would prefer to  try above measures first. Patient has previously required NIV during acute illness with sepsis in the past. Has device at home but has not recently needed it. Her borderline hypercarbia may benefit from it especially if she is  more lethargic but will focus on pulmonary toilet first. Currently only requires 4-6L O2 and 100%, wean for goal SpO2 >88%. Bronchoscopy could help in setting of urgent oxygenation issues however mucous plugging will likely recur and risks vs benefit of procedure with moderate sedation should be considered if pursued. Will opt with conservative measures for now since lower O2 requirement at this time.   P: Bronchoscopy cancelled due to intermittent seizures. Primary team considering neuro consult Wean oxygen as needed for sats greater than 88% Aggressive pulmonary toilet with scheduled nebs, hypertonic saline, CPT q2h when awake NT suctioning PRN Head of bed elevated 30 degrees As needed BIPAP Follow intermittent chest x-ray and ABG as needed  Ensure adequate pulmonary hygiene  Follow cultures  Aspiration precautions    Best Practice (right click and "Reselect all SmartList Selections" daily)  Per primary    Critical care time: NA   Updated mother at bedside Care Time: 50 min  Mechele Collin, M.D. [facility] Pulmonary/Critical Care Medicine 10/12/2023 9:53 AM   See Amion for personal pager For hours between 7 PM to 7 AM, please call Elink for urgent questions

## 2023-10-12 NOTE — Consult Note (Signed)
NEUROLOGY CONSULT NOTE   Date of service: October 12, 2023 Patient Name: Jo Matthews MRN:  [id number] DOB:  04-08-1996 Chief Complaint: "Seizures" Requesting Provider: Narda Bonds, MD  History of Present Illness  Jo Matthews is a 27 y.o. female  has a past medical history of Fracture, humerus closed, G tube feedings (HCC), Pneumonia, Recurrent fever of unknown cause (08/31/2012), Rett's syndrome, Scoliosis, Seizure disorder (HCC), Seizures (HCC), and Weight loss.   Presented to the hospital for evaluation of respiratory failure, mucous plugging and possible community-acquired pneumonia.  She has been having increased seizure frequency per the primary team and neurology was consulted for further recommendations.  The patient has had seizures since the age of 51.  She has been to the care of pediatric neurology/epileptologist Dr. Bertram Savin at [facility]. She has at baseline anywhere from 1-5 seizures with worsening of seizure frequency with infection.  Her medication formulation was changed and was given IV medications, which makes the family think is what worsened her seizure frequency. She has always been nonverbal.  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  Patient gets all meds through G-tube.  Antiepileptics: Clonazepam 0.25 twice daily with additional doses for breakthrough seizures Depakote 125 mg p.o. 3 sprinkles in the morning 3 speckles in the afternoon and 4 at night. 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  Antiepileptics not tolerated-clobazam-due to multiple side effects with most concerning being respiratory depression.   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  Past History   Past Medical History:   Diagnosis Date   Fracture, humerus closed    G tube feedings (HCC)    Pneumonia    Recurrent fever of unknown cause 08/31/2012   Fever to 104-105 lasting 24 hrs, occuring once a week for the past 5 weeks (onset Sept 2013)   Rett's syndrome    Scoliosis    Seizure disorder (HCC)    Seizures (HCC)    Weight loss     Past Surgical History:  Procedure Laterality Date   BACK SURGERY     GASTROSTOMY TUBE PLACEMENT     SPINAL GROWTH RODS     SPINE SURGERY N/A    Phreesia 02/14/2021    Family History: Family History  Problem Relation Age of Onset   Heart disease Other    Hyperlipidemia Other    Diabetes Other     Social History  reports that she has never smoked. She has never used smokeless tobacco. She reports that she does not drink alcohol and does not use drugs.  Allergies  Allergen Reactions   Cephalexin Other (See Comments)    Seizures    Clobazam Shortness Of Breath and Other (See Comments)    Severe side effects = Respiratory distress   Penicillins Shortness Of Breath and Other (See Comments)    Respiratory distress    Medications   Current Facility-Administered Medications:    0.45 % sodium chloride infusion, , Intravenous, Continuous, Narda Bonds, MD, Last Rate: 60 mL/hr at 10/12/23 1417, Infusion Verify at 10/12/23 1417   acetaminophen (TYLENOL) tablet 650 mg, 650 mg, Per Tube, Q6H PRN, 650 mg at 10/11/23 1749 **OR** acetaminophen (TYLENOL) suppository 650 mg, 650 mg, Rectal, Q6H PRN, Anthoney Harada, NP   albuterol (  PROVENTIL) (2.5 MG/3ML) 0.083% nebulizer solution 2.5 mg, 2.5 mg, Nebulization, Q2H PRN, Kirby Crigler, Mir M, MD   cannabidiol (EPIDIOLEX) 100 MG/ML solution 350 mg, 350 mg, Per Tube, BID, Narda Bonds, MD, 350 mg at 10/12/23 8119   Chlorhexidine Gluconate Cloth 2 % PADS 6 each, 6 each, Topical, Daily, Narda Bonds, MD, 6 each at 10/12/23 1478   clonazepam (KLONOPIN) disintegrating tablet 0.25 mg, 0.25 mg, Per Tube, Daily, Narda Bonds, MD, 0.25 mg at 10/12/23 2956   clonazepam (KLONOPIN) disintegrating tablet 0.25 mg, 0.25 mg, Per Tube, Daily PRN, Narda Bonds, MD, 0.25 mg at 10/11/23 1618   clonazePAM (KLONOPIN) disintegrating tablet 0.5 mg, 0.5 mg, Per Tube, QHS, Narda Bonds, MD, 0.5 mg at 10/11/23 2207   diazepam (DIASTAT ACUDIAL) rectal kit 10 mg, 10 mg, Rectal, PRN, Narda Bonds, MD, 10 mg at 10/12/23 0950   divalproex (DEPAKOTE SPRINKLE) capsule 375 mg, 375 mg, Oral, 2 times per day, Luiz Iron, NP, 375 mg at 10/12/23 1243   divalproex (DEPAKOTE SPRINKLE) capsule 500 mg, 500 mg, Oral, q1800, Luiz Iron, NP   enoxaparin (LOVENOX) injection 40 mg, 40 mg, Subcutaneous, Q24H, Kirby Crigler, Mir M, MD, 40 mg at 10/11/23 2208   feeding supplement (OSMOLITE 1.5 CAL) liquid 237 mL, 237 mL, Per Tube, QID, Narda Bonds, MD, 237 mL at 10/11/23 2208   ipratropium-albuterol (DUONEB) 0.5-2.5 (3) MG/3ML nebulizer solution 3 mL, 3 mL, Nebulization, Q4H, Harris, Whitney D, NP, 3 mL at 10/12/23 1200   levETIRAcetam (KEPPRA) 100 MG/ML solution 2,000 mg, 2,000 mg, Per Tube, q morning, 2,000 mg at 10/12/23 0917 **AND** levETIRAcetam (KEPPRA) 100 MG/ML solution 2,250 mg, 2,250 mg, Per Tube, QHS, Narda Bonds, MD, 2,250 mg at 10/11/23 2209   levofloxacin (LEVAQUIN) IVPB 750 mg, 750 mg, Intravenous, Q24H, Anthoney Harada, NP, Stopped at 10/12/23 1400   lip balm (CARMEX) ointment 1 Application, 1 Application, Topical, PRN, Narda Bonds, MD, 1 Application at 10/10/23 2145   ondansetron (ZOFRAN) tablet 4 mg, 4 mg, Per Tube, Q6H PRN **OR** ondansetron (ZOFRAN) injection 4 mg, 4 mg, Intravenous, Q6H PRN, Anthoney Harada, NP   Perampanel SUSP 2 mg, 2 mg, Per Tube, QHS, Narda Bonds, MD, 2 mg at 10/11/23 2210   senna-docusate (Senokot-S) tablet 1 tablet, 1 tablet, Per Tube, QHS PRN, Anthoney Harada, NP   sodium chloride (OCEAN) 0.65 % nasal spray 1 spray, 1 spray, Each Nare, PRN, Narda Bonds, MD, 1 spray at 10/12/23  0335  Vitals   Vitals:   10/12/23 1200 10/12/23 1300 10/12/23 1303 10/12/23 1400  BP: (!) 117/52 135/70  127/63  Pulse: (!) 108 (!) 111  (!) 110  Resp: (!) 32 (!) 23  (!) 30  Temp:   98.1 F (36.7 C)   TempSrc:   Axillary   SpO2: 98% 100%  99%  Weight:      Height:        Body mass index is 20.48 kg/m.  Physical Exam  General small for age looking, laying in bed comfortably HEENT: Normocephalic atraumatic Lungs: Scattered Rales Abdomen nondistended nontender Extremities: Increased tone in both lower extremities with trace pitting edema Neurological exam Very sleepy but awakens to some noxious stimulation. Spontaneously smiles and inconsistently tracks examiner Has been nonverbal which is baseline Cranial nerves: Pupils are equal round reactive to light, is able to look towards both sides without restriction or forced gaze, no facial asymmetry. Motor examination: Has antigravity strength in all 4  extremities although does not follow commands or participate in the exam Sensation: Grimaces to noxious stimulation Coordination cannot be assessed At baseline is wheelchair-bound-gait cannot be assessed  Labs/Imaging/Neurodiagnostic studies   CBC:  Recent Labs  Lab 2023/10/24 0255 10/12/23 0630  WBC 9.8 8.4  HGB 9.4* 10.0*  HCT 30.5* 32.8*  MCV 108.2* 109.0*  PLT 187 202   Basic Metabolic Panel:  Lab Results  Component Value Date   NA 141 10/12/2023   K 3.5 10/12/2023   CO2 43 (H) 10/12/2023   GLUCOSE 116 (H) 10/12/2023   BUN <5 (L) 10/12/2023   CREATININE <0.30 (L) 10/12/2023   CALCIUM 8.6 (L) 10/12/2023   GFRNONAA NOT CALCULATED 10/12/2023   GFRAA >60 08/05/2017   AED levels:  Lab Results  Component Value Date   LEVETIRACETA 49.7 (H) 04/22/2010   Head CT report personally reviewed-care everywhere-from 2022 with no acute intracranial abnormality.  Asymmetric right cerebral hemisphere volume loss.  Also age advanced cerebellar volume loss which may be related  to chronic antiepileptic administration.  Ambulatory EEG from 04/30/2022-moderate generalized slowing of the background with frequent activation of bursts of generalized slow spike and polyspike and wave discharges lasting between 5 to 10 seconds without clinical change.  Burst over 10 seconds evidence of evolution in frequency highly suggestive of subclinical seizures.   Neurodiagnostics Rapid EEG in progress-preliminary review with GPDEDs  ASSESSMENT   Astasia Neuwirth is a 27 y.o. female  has a past medical history of Fracture, humerus closed, G tube feedings (HCC), Pneumonia, Recurrent fever of unknown cause (08/31/2012), Rett's syndrome, Scoliosis, Seizure disorder (HCC), Seizures (HCC), and Weight loss.  Presented for evaluation of worsening hypoxia likely in the setting of community-acquired pneumonia.  Has had increased frequency of seizures.  Usually has 1-5 seizures a day but is having more now. Family reports that this happens with acute illness and this is not uncommon. They were very upset that her medication formulations were changed on admission when they insisted that she should get all the medications as prescribed at home because she is very sensitive to medication changes. I have put in a note to that regarding the chart and added a flag where before making any changes to her medication, her neurologist should be contacted. She gets all her medications via tube so should not be much of a concern.  Impression: Breakthrough seizure in the setting of acute illness   RECOMMENDATIONS  Continue rapid EEG-had had a detailed discussion with the family that this is not an ideal way to monitor her on EEG but they are not very keen on transferring her to [facility] due to prior not pleasant experiences that they had at [facility].  I will have the EEG reviewed with the epileptologist tomorrow and if there are any concerning findings that require transfer to [facility], I will recommend that.  Continue home antiepileptics as above without any changes as prescribed by the outpatient neurologist.  Maintain seizure precautions.  Treatment of acute issues-pneumonia, hypoxia per primary team as you are.  Please call neurology with any questions or concerns  I will follow-up with you again tomorrow.  Plan was relayed to Dr. Caleb Popp.  Detailed discussion with the family-mother and father at bedside for over 45 minutes.  They are especially concerned about attention not being paid to their request and not changed the formulation of the antiepileptics.  I have made a special note in the chart and also added a flag-special  population flag indicating the patient's medications should not be changed unless first discussed with her outpatient neurologist or by contacting inpatient neurology if the outpatient team is not reachable.    ______________________________________________________________________    Signed, Milon Dikes, MD [facility]

## 2023-10-12 NOTE — Progress Notes (Addendum)
  Patient is tachypneic 25 and tachycardic 133.  Currently O2 sat 95% room air.  Patient has mucous plugging, unfortunately bronchoscopy was not successful today as patient has intermittent seizure. - Due to persistent tachycardia changing DuoNeb scheduled to Xopenex and ipratropium every 6 hour scheduled with Mucomyst nebulizer every 6 hour as well.  Continue DuoNeb as needed. -Patient also spiked fever 101.2 F around 6:30 PM.  Currently treating with Levaquin for community-acquired pneumonia.  Pending respiratory panel, sputum culture and blood culture. -Continue pulmonary toiletry, chest vest therapy.   Tereasa Coop, MD [facility] 10/12/2023, 8:01 PM

## 2023-10-12 NOTE — Progress Notes (Signed)
PATIENT IS ON MULTIPLE ANTI-SEIZURE MEDS IT IS NOT ADVISABLE TO STOP MEDS OR CHANGE FORMULATIONS GIVEN HER VERY REFRACTORY EPILEPSY. IN THE FUTURE, PLEASE REFRAIN FROM MAKING ANY CHANGES WITHOUT CONSULTING WITH HER NEUROLOGIST. FEEL FREE TO CALL INPATIENT NEUROLOGY IF YOU CAN NOT REACH THE OUTPATIENT DOCTORS AND NEED IMMEDIATE ASSISTANCE.   -- Milon Dikes, MD Neurologist [facility]

## 2023-10-12 NOTE — Progress Notes (Signed)
Ceribell orders received from Dr Wilford Corner. Dr Melynda Ripple notified by neurology team for monitoring. Ceribell initiated at 1215 by this RN per orders. RN will continue to carefully monitor for seizure activity.

## 2023-10-12 NOTE — Progress Notes (Signed)
Patient's temp spiked to 103.4 this afternoon. PRN ibuprofen given by this RN and fans and ice packs were placed. Dr Caleb Popp notified by this RN. Patient has been very restless, pulling at monitoring equipment since fever spiked. Dr Wilford Corner notified of ongoing interruptions to Ceribell monitoring, and verbal order received to leave Ceribell off until calm and then reapply, hopefully in the next several hours. This RN will continue to closely monitor temperature fluctuations and provide interventions as appropriate.

## 2023-10-12 NOTE — Plan of Care (Signed)
Problem: Nutrition: Goal: Adequate nutrition will be maintained Outcome: Progressing   Problem: Coping: Goal: Level of anxiety will decrease Outcome: Progressing   Problem: Safety: Goal: Ability to remain free from injury will improve Outcome: Progressing

## 2023-10-12 NOTE — Progress Notes (Signed)
Per Dr Bess Harvest verbal order, continue Ceribell overnight with the exception of scheduled chest PT. Per Dr Wilford Corner, okay to stop monitoring during this time to prevent monitor from recording artifact.

## 2023-10-13 DIAGNOSIS — G40919 Epilepsy, unspecified, intractable, without status epilepticus: Secondary | ICD-10-CM | POA: Diagnosis not present

## 2023-10-13 DIAGNOSIS — F842 Rett's syndrome: Secondary | ICD-10-CM | POA: Diagnosis not present

## 2023-10-13 DIAGNOSIS — R509 Fever, unspecified: Secondary | ICD-10-CM

## 2023-10-13 DIAGNOSIS — R569 Unspecified convulsions: Secondary | ICD-10-CM | POA: Diagnosis not present

## 2023-10-13 DIAGNOSIS — J189 Pneumonia, unspecified organism: Secondary | ICD-10-CM | POA: Diagnosis not present

## 2023-10-13 DIAGNOSIS — J9622 Acute and chronic respiratory failure with hypercapnia: Secondary | ICD-10-CM | POA: Diagnosis not present

## 2023-10-13 DIAGNOSIS — Z151 Genetic susceptibility to epilepsy and neurodevelopmental disorders: Secondary | ICD-10-CM | POA: Diagnosis not present

## 2023-10-13 DIAGNOSIS — J9621 Acute and chronic respiratory failure with hypoxia: Secondary | ICD-10-CM | POA: Diagnosis not present

## 2023-10-13 LAB — CBC
HCT: 29.7 % — ABNORMAL LOW (ref 36.0–46.0)
Hemoglobin: 9.3 g/dL — ABNORMAL LOW (ref 12.0–15.0)
MCH: 32.6 pg (ref 26.0–34.0)
MCHC: 31.3 g/dL (ref 30.0–36.0)
MCV: 104.2 fL — ABNORMAL HIGH (ref 80.0–100.0)
Platelets: 187 10*3/uL (ref 150–400)
RBC: 2.85 MIL/uL — ABNORMAL LOW (ref 3.87–5.11)
RDW: 12.2 % (ref 11.5–15.5)
WBC: 7.5 10*3/uL (ref 4.0–10.5)
nRBC: 0 % (ref 0.0–0.2)

## 2023-10-13 LAB — MRSA NEXT GEN BY PCR, NASAL: MRSA by PCR Next Gen: NOT DETECTED

## 2023-10-13 LAB — PROCALCITONIN: Procalcitonin: 1.27 ng/mL

## 2023-10-13 MED ORDER — ORAL CARE MOUTH RINSE: 15.0000 mL | OROMUCOSAL | Status: DC | PRN

## 2023-10-13 MED ORDER — ORAL CARE MOUTH RINSE
15.0000 mL | OROMUCOSAL | Status: DC
  Administered 2023-10-13 – 2023-10-18 (×20): 15 mL via OROMUCOSAL

## 2023-10-13 MED ORDER — AZITHROMYCIN 250 MG PO TABS
500.0000 mg | ORAL_TABLET | Freq: Every day | ORAL | Status: AC
  Administered 2023-10-13 – 2023-10-15 (×3): 500 mg
  Filled 2023-10-13 (×3): qty 2

## 2023-10-13 MED ORDER — OSMOLITE 1.5 CAL PO LIQD
237.0000 mL | Freq: Three times a day (TID) | ORAL | Status: DC
  Administered 2023-10-13 – 2023-10-18 (×14): 237 mL
  Filled 2023-10-13 (×18): qty 237

## 2023-10-13 MED ORDER — FREE WATER
100.0000 mL | Freq: Four times a day (QID) | Status: DC
  Administered 2023-10-13 – 2023-10-18 (×20): 100 mL

## 2023-10-13 MED ORDER — ACETYLCYSTEINE 20 % IN SOLN
4.0000 mL | Freq: Two times a day (BID) | RESPIRATORY_TRACT | Status: DC
  Administered 2023-10-13 – 2023-10-18 (×10): 4 mL via RESPIRATORY_TRACT
  Filled 2023-10-13 (×11): qty 4

## 2023-10-13 MED ORDER — SODIUM CHLORIDE 0.9 % IV SOLN
2.0000 g | INTRAVENOUS | Status: AC
  Administered 2023-10-13 – 2023-10-15 (×3): 2 g via INTRAVENOUS
  Filled 2023-10-13 (×3): qty 20

## 2023-10-13 MED ORDER — PERAMPANEL 0.5 MG/ML PO SUSP
4.0000 mL | Freq: Every day | ORAL | Status: DC
Start: 2023-10-13 — End: 2023-10-18
  Administered 2023-10-13 – 2023-10-17 (×5): 4 mL
  Filled 2023-10-13 (×5): qty 4

## 2023-10-13 NOTE — Progress Notes (Signed)
Nutrition Follow-up  DOCUMENTATION CODES:   Not applicable  INTERVENTION:  - 3 cartons daily of Osmolite 1.5 (1 carton at 9am, 3pm, and 9pm) + ProSource Plus 60mL daily + 60mL before and after each bolus + Q6H FWF Provides 1147 kcals, 65g protein, and free water  - Monitor hydration status and adjust FWF as needed.  - Monitor weight trends.   NUTRITION DIAGNOSIS:   Increased nutrient needs related to chronic illness as evidenced by estimated needs. *ongoing  GOAL:   Patient will meet greater than or equal to 90% of their needs *met with TF  MONITOR:   TF tolerance, PO intake  REASON FOR ASSESSMENT:   Consult Assessment of nutrition requirement/status  ASSESSMENT:   27 y.o. F, admitted with sepsis secondary to pneumonia with associated mucous plugging. PMH: Rett's syndrome, seizure, G tube feedings, scoliosis, weight loss.  Met with mom and patient at bedside this AM.  Mom reports a UBW of 100# and states patient has been steady in weight. Notes 100# is the highest she has ever been but no major changes recently.  Per EMR, weight stable over the past year.   Mom shares patient eats a regular diet at home. In the past has eaten everything set in front of her but recently has been more picky and not eating as much. Will often have something at breakfast and lunch but not at dinner.   She also has a G tube and receives tube feeds at home. Per mom's report, patient receives: 3 cartons daily of Osmolite 1.5 1 carton at each meal (breakfast, lunch, dinner)  If patient eats 50% of a meal they will only provide 1/2 carton after the meal. She often doesn't eat anything at dinner so gets a full carton.  She was increased to 4 cartons a day once made NPO on 11/24. Mom feels this has been too much. Will back down to just 3 cartons a day per home regimen and monitor weights. Will add free water flushes and ProSource to ensure hydration and protein needs are met. MD  on board with plan.  Mom asking if patient can have sips of bites of things from meals she brings in. Per RN, patient not yet alert enough for PO.  Medications reviewed and include: -  Labs reviewed:  -   Diet Order:   Diet Order             Diet NPO time specified  Diet effective now                   EDUCATION NEEDS:  Not appropriate for education at this time  Skin:  Skin Assessment: Reviewed RN Assessment  Last BM:  11/26 - type 6  Height:  Ht Readings from Last 1 Encounters:  10/09/23 4\' 11"  (1.499 m)   Weight:  Wt Readings from Last 1 Encounters:  10/09/23 46 kg    BMI:  Body mass index is 20.48 kg/m.  Estimated Nutritional Needs:  Kcal:  1150-1300 kcals Protein:  60-75 g/d Fluid:  >/= 1.2L    Shelle Iron RD, LDN For contact information, refer to [facility].

## 2023-10-13 NOTE — Progress Notes (Addendum)
NAME:  Jo Matthews, MRN:  [id number], DOB:  19-Sep-1996, LOS: 4 ADMISSION DATE:  10/09/2023, CONSULTATION DATE:  10/09/2023 REFERRING MD:  Dr Theresia Lo, CHIEF COMPLAINT:  Mucus plugging   History of Present Illness:  Jo Matthews is a 27 y.o. female with an extensive PMH significant  for Retts Syndrome, seizure disorder, cerebral palsy, and chronic hypoxic respiratory failure on 0.5-1L [place] at baseline who presented to the ED 11/22 for worsening hypoxia, cough, SOB, and fatigue. Patient was seen by PCP who obtained a CXR that was concerning for PNA and prompted family to present to the ED. On ED arrival patient was seen with mild tachycardia with all other vitals WNL on 6L [place]  Patient was admitted per [facility] for management of CAP    CTA chest obtained and negative for PE but positive for right sisded PNA and near complete collapse of left in the setting of mucus plug. PCCM consulted for assistance in management.    Pertinent  Medical History  Retts Syndrome, seizure disorder, cerebral palsy, and chronic hypoxic respiratory failure on 0.5-1L [place] at baseline   Significant Hospital Events: Including procedures, antibiotic start and stop dates in addition to other pertinent events   11/22 Presented at PCP recommendation for management of CAP. CTA chest obtained and negative for PE but positive for right sisded PNA and near complete collapse of left in the setting of mucus plug.  11/23 Improved left upper lobe aeration 11/25 had multiple seizures, bronchoscopy had been planned but had to be canceled   Antibiotics: -Levaquin x 4 days -Positive for rhinovirus  Interim History / Subjective:  Last seizure was about 2 AM lasting about 30 seconds Remains on oxygen supplementation Desaturates with seizures  Objective   Blood pressure (!) 141/80, pulse 94, temperature 97.9 F (36.6 C), temperature source Axillary, resp. rate 17, height 4\' 11"  (1.499 m), weight 46 kg, SpO2 100%.        Intake/Output  Summary (Last 24 hours) at 10/13/2023 0809 Last data filed at 10/13/2023 0700 Gross per 24 hour  Intake 1365.42 ml  Output 0 ml  Net 1365.42 ml   Filed Weights   10/09/23 1217 10/09/23 1438  Weight: 45.8 kg 46 kg    Examination: General: Chronically ill-appearing, does not appear to be in acute distress HENT: Nasal cannula in place, moist oral mucosa Lungs: Diminished air entry bilaterally, no rales, no wheezing Cardiovascular: S1-S2 appreciated Abdomen: Soft, bowel sounds appreciated Extremities: no edema Neuro: Sleeping, did not arouse easily GU:   I reviewed nursing notes, Consultant notes, hospitalist notes, last 24 h vitals and pain scores, last 48 h intake and output, last 24 h labs and trends, and last 24 h imaging results. -Reviewed recent CT  Resolved Hospital Problem list     Assessment & Plan:  Acute on chronic hypoxic and hypercapnic respiratory failure -At baseline on 0.5 to 1 L of oxygen, requiring increased oxygen supplementation  Community-acquired pneumonia Left-sided mucous plugging -Continue aggressive pulmonary toileting including chest PT, nebulizers, hypertonic saline -May require ventilator to be able to perform a bronchoscopy however this will be escalation of care, concern will be whether she will continue to produce significant mucus to continue to plug off which may require multiple bronchoscopies -Currently on levofloxacin, today will be day 5  Seizures -Appreciate neurology evaluation -Defer to neuro regarding ongoing management -Current medications include clonazepam for breakthrough seizures, Depakote, Keppra, Epidiolex -Epilepsy type noted to be 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 -Continue management for possible bacterial pneumonia as well  Maintain head of the bed  elevation at least 30 degrees May need BiPAP as needed  Intermittent chest x-ray  Updated family member at bedside Mom will be around later today-will update  Discussed with Dr. [facility] (right click and "Reselect all SmartList Selections" daily)   Per primary  Virl Diamond, MD [place] PCCM Pager: See Loretha Stapler

## 2023-10-13 NOTE — Progress Notes (Addendum)
PROGRESS NOTE    Jo Matthews  ZOX:[id number] DOB: 08/31/1996 DOA: 10/09/2023 PCP: Georgiann Hahn, MD   Brief Narrative: Jo Matthews is a 27 y.o. female with a history of Rett syndrome, severe sclerosis, seizure disorder, chronic respiratory failure with hypoxia.  Patient presented secondary to hypoxia and found to have sepsis secondary to pneumonia with associated mucous plugging. Levaquin started. Pulmonology consulted. No bronchoscopy recommended, but patient started on conservative treatment with aggressive pulmonary toilet with nebulizer, hypertonic saline and chest PT treatments.   Assessment and Plan:  Sepsis Present on admission and secondary to pneumonia. Patient with leukocytosis and fever with associated acute respiratory failure. Blood cultures obtained on admission and are no growth to date. Empiric antibiotics started for treatment of pneumonia. -Continue antibiotics and follow-up blood culture data  Community acquired pneumonia Present on admission. Associated leukocytosis. CT imaging significant for new airspace disease of the right lower lobe pneumonia. Patient started empirically on Levaquin. -Discontinue Levaquin secondary to concern for lowering seizure threshold; switch to Ceftriaxone and azithromycin with plan to complete a 7 day total course of antibiotics -Sputum culture pending  Acute on chronic respiratory failure with hypoxia Secondary to pneumonia and mucous plugging. Patient uses about 0.5 - 1L/min baseline. Patient requiring up to 5 L/min this admission. Weaned to 3 L/min but now back up to 4 L/min. Chest x-ray with worsened airspace disease of left upper lobe. Pulmonology planning bronchoscopy today. -Continue weaning to baseline 1 L/min as able  Mucous plugging Contributing to respiratory failure. Recommendation for aggressive pulmonary toilet. Bronchoscopy attempted on 11/25, however patient developed seizure and was postictal. -Continue chest PT,  hypertonic saline nebulizer treatments, Mucomyst -Continued pulmonology recommendations for consideration of bronchoscopy  Chronic anemia Hemoglobin is stable at baseline.  Hepatomegaly Hepatic steatosis Noted.  Cardiomegaly Noted on CT imaging. No history of heart failure. Presentation not consistent with acute heart failure. Recommend outpatient workup.  Dysphagia -Continue dysphagia 1 diet -Continue feeding supplement per tube  Seizure disorder Rett syndrome Patient with multiple breakthrough seizures while on home medications. Complicating ability to obtain bronchoscopy. Discussed with neurology who recommended transfer to [facility] for LTM EEG, however this was declined by family at this time. -Continue Keppra, Depakote sprinkles, perampanel -Neurology recommendations: pending today -Continue Diazepam/Klonopin PRN for seizures   DVT prophylaxis: Lovenox Code Status:   Code Status: Full Code Family Communication: Father at bedside Disposition Plan: Discharge home likely in 2-4 days pending ability to wean oxygen to baseline, improvement in pneumonia with ability to transition to outpatient regimen, ongoing pulmonology recommendations   Consultants: Pulmonology Neurology  Procedures:  None  Antimicrobials: Levaquin    Subjective: Ongoing fever with Tmax of 103.4 F, responding to ibuprofen. Father states fevers, even up to 53 F, are not uncommon for Jo Matthews at baseline. Two small seizures overnight.  Objective: BP (!) 141/80   Pulse 94   Temp 97.9 F (36.6 C) (Axillary)   Resp 17   Ht 4\' 11"  (1.499 m)   Wt 46 kg   SpO2 100%   BMI 20.48 kg/m   Examination:  General exam: Appears calm and comfortable Respiratory system: Diminished breath sounds; coarse with no wheezing. Slight tachypnea. Cardiovascular system: S1 & S2 heard, RRR. Gastrointestinal system: Abdomen is nondistended, soft and nontender. Normal bowel sounds heard. Central  nervous system: Alert and oriented. No focal neurological deficits. Psychiatry: Judgement and insight appear normal. Mood & affect appropriate.    Data Reviewed: I have personally reviewed following labs and imaging  studies  CBC Lab Results  Component Value Date   WBC 7.5 10/13/2023   RBC 2.85 (L) 10/13/2023   HGB 9.3 (L) 10/13/2023   HCT 29.7 (L) 10/13/2023   MCV 104.2 (H) 10/13/2023   MCH 32.6 10/13/2023   PLT 187 10/13/2023   MCHC 31.3 10/13/2023   RDW 12.2 10/13/2023   LYMPHSABS 3,330 12/26/2022   MONOABS 0.4 05/03/2021   EOSABS 259 12/26/2022   BASOSABS 52 12/26/2022     Last metabolic panel Lab Results  Component Value Date   NA 141 10/12/2023   K 3.5 10/12/2023   CL 93 (L) 10/12/2023   CO2 43 (H) 10/12/2023   BUN <5 (L) 10/12/2023   CREATININE <0.30 (L) 10/12/2023   GLUCOSE 116 (H) 10/12/2023   GFRNONAA NOT CALCULATED 10/12/2023   GFRAA >60 08/05/2017   CALCIUM 8.6 (L) 10/12/2023   PHOS 2.8 12/06/2014   PROT 6.3 (L) 10/12/2023   ALBUMIN 2.9 (L) 10/12/2023   BILITOT 0.4 10/12/2023   ALKPHOS 38 10/12/2023   AST 17 10/12/2023   ALT 11 10/12/2023   ANIONGAP 5 10/12/2023    GFR: CrCl cannot be calculated (This lab value cannot be used to calculate CrCl because it is not a number: <0.30).  Recent Results (from the past 240 hour(s))  Resp panel by RT-PCR (RSV, Flu A&B, Covid) Anterior Nasal Swab     Status: None   Collection Time: 10/09/23  1:10 PM   Specimen: Anterior Nasal Swab  Result Value Ref Range Status   SARS Coronavirus 2 by RT PCR NEGATIVE NEGATIVE Final    Comment: (NOTE) SARS-CoV-2 target nucleic acids are NOT DETECTED.  The SARS-CoV-2 RNA is generally detectable in upper respiratory specimens during the acute phase of infection. The lowest concentration of SARS-CoV-2 viral copies this assay can detect is 138 copies/mL. A negative result does not preclude SARS-Cov-2 infection and should not be used as the sole basis for treatment or other  patient management decisions. A negative result may occur with  improper specimen collection/handling, submission of specimen other than nasopharyngeal swab, presence of viral mutation(s) within the areas targeted by this assay, and inadequate number of viral copies(<138 copies/mL). A negative result must be combined with clinical observations, patient history, and epidemiological information. The expected result is Negative.  Fact Sheet for Patients:  BloggerCourse.com  Fact Sheet for Healthcare Providers:  SeriousBroker.it  This test is no t yet approved or cleared by the [place] FDA and  has been authorized for detection and/or diagnosis of SARS-CoV-2 by FDA under an Emergency Use Authorization (EUA). This EUA will remain  in effect (meaning this test can be used) for the duration of the COVID-19 declaration under Section 564(b)(1) of the Act, 21 U.S.C.section 360bbb-3(b)(1), unless the authorization is terminated  or revoked sooner.       Influenza A by PCR NEGATIVE NEGATIVE Final   Influenza B by PCR NEGATIVE NEGATIVE Final    Comment: (NOTE) The Xpert Xpress SARS-CoV-2/FLU/RSV plus assay is intended as an aid in the diagnosis of influenza from Nasopharyngeal swab specimens and should not be used as a sole basis for treatment. Nasal washings and aspirates are unacceptable for Xpert Xpress SARS-CoV-2/FLU/RSV testing.  Fact Sheet for Patients: BloggerCourse.com  Fact Sheet for Healthcare Providers: SeriousBroker.it  This test is not yet approved or cleared by the [place] FDA and has been authorized for detection and/or diagnosis of SARS-CoV-2 by FDA under an Emergency Use Authorization (EUA). This EUA will remain in effect (  meaning this test can be used) for the duration of the COVID-19 declaration under Section 564(b)(1) of the Act, 21 U.S.C. section  360bbb-3(b)(1), unless the authorization is terminated or revoked.     Resp Syncytial Virus by PCR NEGATIVE NEGATIVE Final    Comment: (NOTE) Fact Sheet for Patients: BloggerCourse.com  Fact Sheet for Healthcare Providers: SeriousBroker.it  This test is not yet approved or cleared by the [place] FDA and has been authorized for detection and/or diagnosis of SARS-CoV-2 by FDA under an Emergency Use Authorization (EUA). This EUA will remain in effect (meaning this test can be used) for the duration of the COVID-19 declaration under Section 564(b)(1) of the Act, 21 U.S.C. section 360bbb-3(b)(1), unless the authorization is terminated or revoked.  Performed at [facility], [address] [place], [place] [address]   Blood culture (routine x 2)     Status: None (Preliminary result)   Collection Time: 10/09/23  1:20 PM   Specimen: BLOOD  Result Value Ref Range Status   Specimen Description   Final    BLOOD RIGHT ANTECUBITAL Performed at [facility], [address] [place], [place] [address]    Special Requests   Final    BOTTLES DRAWN AEROBIC AND ANAEROBIC Blood Culture results may not be optimal due to an excessive volume of blood received in culture bottles Performed at [facility], [address] [place], [place] [address]    Culture   Final    NO GROWTH 3 DAYS Performed at [facility], [address], [place], [place] [address]    Report Status PENDING  Incomplete  Blood culture (routine x 2)     Status: None (Preliminary result)   Collection Time: 10/09/23  1:30 PM   Specimen: BLOOD  Result Value Ref Range Status   Specimen Description   Final    BLOOD LEFT ANTECUBITAL Performed at [facility], [address], [place], [place] [address]    Special Requests   Final    BOTTLES DRAWN AEROBIC AND ANAEROBIC Blood Culture adequate  volume Performed at [facility], [address], [place], [place] [address]    Culture   Final    NO GROWTH 3 DAYS Performed at [facility], [address], [place], [place] [address]    Report Status PENDING  Incomplete  Respiratory (~20 pathogens) panel by PCR     Status: Abnormal   Collection Time: 10/12/23  6:36 PM   Specimen: Nasopharyngeal Swab; Respiratory  Result Value Ref Range Status   Adenovirus NOT DETECTED NOT DETECTED Final   Coronavirus 229E NOT DETECTED NOT DETECTED Final    Comment: (NOTE) The Coronavirus on the Respiratory Panel, DOES NOT test for the novel  Coronavirus (2019 nCoV)    Coronavirus HKU1 NOT DETECTED NOT DETECTED Final   Coronavirus NL63 NOT DETECTED NOT DETECTED Final   Coronavirus OC43 NOT DETECTED NOT DETECTED Final   Metapneumovirus NOT DETECTED NOT DETECTED Final   Rhinovirus / Enterovirus DETECTED (A) NOT DETECTED Final   Influenza A NOT DETECTED NOT DETECTED Final   Influenza B NOT DETECTED NOT DETECTED Final   Parainfluenza Virus 1 NOT DETECTED NOT DETECTED Final   Parainfluenza Virus 2 NOT DETECTED NOT DETECTED Final   Parainfluenza Virus 3 NOT DETECTED NOT DETECTED Final   Parainfluenza Virus 4 NOT DETECTED NOT DETECTED Final   Respiratory Syncytial Virus NOT DETECTED NOT DETECTED Final   Bordetella pertussis NOT  DETECTED NOT DETECTED Final   Bordetella Parapertussis NOT DETECTED NOT DETECTED Final   Chlamydophila pneumoniae NOT DETECTED NOT DETECTED Final   Mycoplasma pneumoniae NOT DETECTED NOT DETECTED Final    Comment: Performed at [facility], [address], [place], [place] [address]  MRSA Next Gen by PCR, Nasal     Status: None   Collection Time: 10/13/23  1:09 AM   Specimen: Nasal Mucosa; Nasal Swab  Result Value Ref Range Status   MRSA by PCR Next Gen NOT DETECTED NOT DETECTED Final    Comment: (NOTE) The GeneXpert MRSA Assay (FDA approved for NASAL specimens only), is one component of a  comprehensive MRSA colonization surveillance program. It is not intended to diagnose MRSA infection nor to guide or monitor treatment for MRSA infections. Test performance is not FDA approved in patients less than 27 years old. Performed at [facility], [address] [place], [place] [address]       Radiology Studies: DG CHEST PORT 1 VIEW  Result Date: 10/12/2023 CLINICAL DATA:  Follow up atelectasis. EXAM: PORTABLE CHEST 1 VIEW COMPARISON:  Radiographs 10/11/2023, 10/10/2023 and 10/09/2023. CT 10/09/2023. FINDINGS: 0659 hours. Persistent complete opacification of the left hemithorax with patchy right basilar airspace disease not obscuring the right hemidiaphragm. No evidence of pneumothorax or large pleural effusion as correlated with recent CT. The visualized heart size and mediastinal contours are stable. Previous thoracolumbar spinal rod fusion appears unchanged. No acute osseous findings are evident. IMPRESSION: Persistent complete opacification of the left hemithorax with patchy right basilar airspace disease. No new findings demonstrated. Electronically Signed   By: Carey Bullocks M.D.   On: 10/12/2023 11:04      LOS: 4 days    Jacquelin Hawking, MD [facility] 10/13/2023, 8:24 AM   If 7PM-7AM, please contact night-coverage [email or web address]

## 2023-10-13 NOTE — Plan of Care (Signed)
  Problem: Education: Goal: Knowledge of General Education information will improve Description: Including pain rating scale, medication(s)/side effects and non-pharmacologic comfort measures Outcome: Progressing   Problem: Health Behavior/Discharge Planning: Goal: Ability to manage health-related needs will improve Outcome: Progressing   Problem: Clinical Measurements: Goal: Ability to maintain clinical measurements within normal limits will improve Outcome: Progressing Goal: Will remain free from infection Outcome: Progressing Goal: Diagnostic test results will improve Outcome: Progressing Goal: Respiratory complications will improve Outcome: Progressing Goal: Cardiovascular complication will be avoided Outcome: Progressing   Problem: Activity: Goal: Risk for activity intolerance will decrease Outcome: Progressing   Problem: Nutrition: Goal: Adequate nutrition will be maintained Outcome: Progressing   Problem: Coping: Goal: Level of anxiety will decrease Outcome: Progressing   Problem: Elimination: Goal: Will not experience complications related to bowel motility Outcome: Progressing Goal: Will not experience complications related to urinary retention Outcome: Progressing   Problem: Pain Management: Goal: General experience of comfort will improve Outcome: Progressing   Problem: Safety: Goal: Ability to remain free from injury will improve Outcome: Progressing   Problem: Skin Integrity: Goal: Risk for impaired skin integrity will decrease Outcome: Progressing   Problem: Education: Goal: Expressions of having a comfortable level of knowledge regarding the disease process will increase Outcome: Progressing   Problem: Coping: Goal: Ability to adjust to condition or change in health will improve Outcome: Progressing Goal: Ability to identify appropriate support needs will improve Outcome: Progressing   Problem: Health Behavior/Discharge Planning: Goal:  Compliance with prescribed medication regimen will improve Outcome: Progressing   Problem: Medication: Goal: Risk for medication side effects will decrease Outcome: Progressing   Problem: Clinical Measurements: Goal: Complications related to the disease process, condition or treatment will be avoided or minimized Outcome: Progressing Goal: Diagnostic test results will improve Outcome: Progressing   Problem: Safety: Goal: Verbalization of understanding the information provided will improve Outcome: Progressing   Problem: Self-Concept: Goal: Level of anxiety will decrease Outcome: Progressing Goal: Ability to verbalize feelings about condition will improve Outcome: Progressing  Patient is non-verbal. Goals for applicable items refer to family/guardian.  Cindy S. Clelia Croft BSN, RN, CCRP, CCRN 10/13/2023 3:13 AM

## 2023-10-13 NOTE — Procedures (Signed)
Patient Name: Jo Matthews  MRN: [id number]  Epilepsy Attending: Charlsie Quest  Referring Physician/Provider: Milon Dikes, MD  Duration: 10/12/2023 1215 to 10/13/2023 0520  Patient history: 27 yo F with history of Retts, breakthrough seizures in the setting of acute pna, no med changes but still having frequent seizures. EEG to evaluate for seizure  Level of alertness: Awake, asleep  AEDs during EEG study: CBD, Clonazepam, VPA, LEV, Perampanel  Technical aspects: This EEG was obtained using a 10 lead EEG system positioned circumferentially without any parasagittal coverage (rapid EEG). Computer selected EEG is reviewed as  well as background features and all clinically significant events.  Description: No clear posterior dominant rhythm was seen.  Sleep was characterized by sleep symptoms (12 to 14 Hz), maximal frontocentral region. EEG showed continuous generalized polymorphic 3 to 6 Hz theta-delta slowing. Generalized periodic discharges with overriding fast activity at 1-1.5 Hz were also noted. Hyperventilation and photic stimulation were not performed.     EEG was disconnected between 10/12/2023 1757 to 10/13/2023 0118.   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 This EEG pattern is on the ictal-interictal continuum and increased risk of seizures. Additionally there is moderate diffuse encephalopathy.  Recommend conventional eeg for further evaluation   Jo Matthews [name]

## 2023-10-13 NOTE — Plan of Care (Signed)
I spoke with the patient's mother and Dr. Caleb Popp.  Patient's mother feels that she is improving and is better than the previous day.  Unless the parents have concern of worsening clinical seizure activity, I do not see further need for electrographic monitoring.  Her EEG, we know, at baseline is abnormal and will always be-there is nothing more that we can do other than having her on the antiepileptics that she is already on.  Please continue the antiepileptic medications for now and if any changes are considered, please make sure to run it by her outpatient neurologist or if they are not reachable, please feel free to reach out to the inpatient neurology team. This was discussed in detail with Dr. Caleb Popp, bedside RN and the patient's mother.  Inpatient neurology will be available as needed.  -- Milon Dikes, MD Neurologist [facility]

## 2023-10-13 NOTE — Plan of Care (Signed)
  Problem: Clinical Measurements: Goal: Respiratory complications will improve Outcome: Progressing Goal: Cardiovascular complication will be avoided Outcome: Progressing   Problem: Nutrition: Goal: Adequate nutrition will be maintained Outcome: Progressing   Problem: Safety: Goal: Ability to remain free from injury will improve Outcome: Progressing   Problem: Skin Integrity: Goal: Risk for impaired skin integrity will decrease Outcome: Progressing

## 2023-10-14 ENCOUNTER — Inpatient Hospital Stay (HOSPITAL_COMMUNITY): Payer: Medicaid Other

## 2023-10-14 DIAGNOSIS — J189 Pneumonia, unspecified organism: Secondary | ICD-10-CM | POA: Diagnosis not present

## 2023-10-14 DIAGNOSIS — G40919 Epilepsy, unspecified, intractable, without status epilepticus: Secondary | ICD-10-CM | POA: Diagnosis not present

## 2023-10-14 DIAGNOSIS — J9622 Acute and chronic respiratory failure with hypercapnia: Secondary | ICD-10-CM | POA: Diagnosis not present

## 2023-10-14 DIAGNOSIS — J9621 Acute and chronic respiratory failure with hypoxia: Secondary | ICD-10-CM | POA: Diagnosis not present

## 2023-10-14 LAB — CBC
HCT: 30.9 % — ABNORMAL LOW (ref 36.0–46.0)
Hemoglobin: 9.5 g/dL — ABNORMAL LOW (ref 12.0–15.0)
MCH: 33.3 pg (ref 26.0–34.0)
MCHC: 30.7 g/dL (ref 30.0–36.0)
MCV: 108.4 fL — ABNORMAL HIGH (ref 80.0–100.0)
Platelets: 226 10*3/uL (ref 150–400)
RBC: 2.85 MIL/uL — ABNORMAL LOW (ref 3.87–5.11)
RDW: 12.3 % (ref 11.5–15.5)
WBC: 11.1 10*3/uL — ABNORMAL HIGH (ref 4.0–10.5)
nRBC: 0.2 % (ref 0.0–0.2)

## 2023-10-14 LAB — CULTURE, BLOOD (ROUTINE X 2)
Culture: NO GROWTH
Culture: NO GROWTH
Special Requests: ADEQUATE

## 2023-10-14 LAB — BASIC METABOLIC PANEL
Anion gap: 9 (ref 5–15)
BUN: <5 mg/dL — ABNORMAL LOW (ref 6–20)
CO2: 39 mmol/L — ABNORMAL HIGH (ref 22–32)
Calcium: 8.8 mg/dL — ABNORMAL LOW (ref 8.9–10.3)
Chloride: 94 mmol/L — ABNORMAL LOW (ref 98–111)
Creatinine, Ser: <0.3 mg/dL — ABNORMAL LOW (ref 0.44–1.00)
Glucose, Bld: 117 mg/dL — ABNORMAL HIGH (ref 70–99)
Potassium: 3.3 mmol/L — ABNORMAL LOW (ref 3.5–5.1)
Sodium: 142 mmol/L (ref 135–145)

## 2023-10-14 MED ORDER — POLYVINYL ALCOHOL 1.4 % OP SOLN
1.0000 [drp] | OPHTHALMIC | Status: DC | PRN
  Administered 2023-10-14: 1 [drp] via OPHTHALMIC
  Filled 2023-10-14: qty 15

## 2023-10-14 MED ORDER — POTASSIUM CHLORIDE 20 MEQ PO PACK
40.0000 meq | PACK | Freq: Two times a day (BID) | ORAL | Status: AC
  Administered 2023-10-14 (×2): 40 meq via ORAL
  Filled 2023-10-14 (×2): qty 2

## 2023-10-14 NOTE — Progress Notes (Signed)
I had multiple discussions with patient's parents.  Earlier in the day with patient's mother and with both parents in the late afternoon  Questions answered regarding bronchoscopy and the need for mechanical ventilation for bronchoscopy.  The risk with having to be on a ventilator for few days to assess readiness for extubation if the lungs do not open up immediately and the possible need for multiple bronchoscopies to adequately clear of the lungs  Questions were answered  They would want to wait and not have her have any invasive procedure until after Thanksgiving.  They are aware that there is always the risk of worsening.  Will continue current level of care  Will continue to follow closely

## 2023-10-14 NOTE — TOC Initial Note (Signed)
Transition of Care [facility]) - Initial/Assessment Note    Patient Details  Name: Jo Matthews MRN: [id number] Date of Birth: July 23, 1996  Transition of Care [facility]) CM/SW Contact:    Lavenia Atlas, RN Phone Number: 10/14/2023, 5:35 PM  Clinical Narrative:  Per chart review patient currently in [facility] SDU for acute on chronic hypoxic respiratory failure. Spoke with patient's mother who reports she is unsure of which DME vendor supplies patient's oxygen, however prior to admission patient was on 1L [place]. Patient has NIV with [employer]. Patient's mother reports she needs a new wheelchair due to outgrowing current wheelchair (over 51 years old). Patient's mother inquiring about resources for wheelchair accessible Zenaida Niece. Patient has HHRN with [employer].   TOC will continue to follow for discharge needs                  Expected Discharge Plan:  (unknown at this time) Barriers to Discharge: Continued Medical Work up   Patient Goals and CMS Choice Patient states their goals for this hospitalization and ongoing recovery are:: to feel better CMS Medicare.gov Compare Post Acute Care list provided to:: Patient Represenative (must comment) Choice offered to / list presented to : Parent [place] ownership interest in [facility].provided to:: Parent NA    Expected Discharge Plan and Services In-house Referral: NA Discharge Planning Services: CM Consult Post Acute Care Choice: Resumption of Svcs/PTA Provider (59 hours of in home nursing) Living arrangements for the past 2 months: Single Family Home                 DME Arranged: N/A DME Agency: NA       HH Arranged: RN HH Agency: [facility]        Prior Living Arrangements/Services Living arrangements for the past 2 months: Single Family Home Lives with:: Parents Patient language and need for interpreter reviewed:: Yes Do you feel safe going back to the place where you live?: Yes      Need for Family Participation in Patient Care:  Yes (Comment) Care giver support system in place?: Yes (comment) Current home services: DME (Wheelchair, hoyer lift, Chest vest, suction tubes/suction machine,Nebulizer,Pulse ox, O2 tubes and tank, diapers   G -tube button with Tubings) Criminal Activity/Legal Involvement Pertinent to Current Situation/Hospitalization: No - Comment as needed  Activities of Daily Living   ADL Screening (condition at time of admission) Independently performs ADLs?: No Does the patient have a NEW difficulty with bathing/dressing/toileting/self-feeding that is expected to last >3 days?: No Does the patient have a NEW difficulty with getting in/out of bed, walking, or climbing stairs that is expected to last >3 days?: No Does the patient have a NEW difficulty with communication that is expected to last >3 days?: No Is the patient deaf or have difficulty hearing?: No Does the patient have difficulty seeing, even when wearing glasses/contacts?: No Does the patient have difficulty concentrating, remembering, or making decisions?: Yes  Permission Sought/Granted Permission sought to share information with : Case Manager Permission granted to share information with : Yes, Verbal Permission Granted  Share Information with NAME: Case manager           Emotional Assessment Appearance:: Appears stated age Attitude/Demeanor/Rapport: Gracious Affect (typically observed): Accepting   Alcohol / Substance Use: Not Applicable Psych Involvement: No (comment)  Admission diagnosis:  Hypoxia [R09.02] Pneumonia of both lungs due to infectious organism, unspecified part of lung [J18.9] Acute on chronic hypoxic respiratory failure (HCC) [J96.21] Patient Active Problem List  Diagnosis Date Noted   Breakthrough seizure (HCC) 10/12/2023   Community acquired bacterial pneumonia 10/11/2023   Acute on chronic hypoxic respiratory failure (HCC) 10/09/2023   Fever 01/21/2023   Seizures (HCC) 01/21/2023   Community acquired  pneumonia of left lower lobe of lung 01/21/2023   Vitamin D deficiency 12/28/2022   Sepsis (HCC) 05/01/2021   Acute on chronic respiratory failure with hypoxia and hypercapnia (HCC) 05/01/2021   Acute hypoxemic respiratory failure due to COVID-19 (HCC) 05/01/2021   Tinea unguium 02/19/2021   Annual physical exam 02/19/2021   BMI (body mass index), pediatric, less than 5th percentile for age 86/17/2020   Hypercapnic respiratory failure, chronic (HCC) 07/16/2018   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 [place] [address] Phone: [phone] Fax: [phone]     Social Determinants of Health (SDOH) Social History: SDOH Screenings   Food Insecurity: Patient Unable To Answer (10/10/2023)  Housing: Patient Declined (10/10/2023)  Transportation Needs: No Transportation Needs (10/10/2023)  Utilities: Not At Risk (10/10/2023)  Tobacco Use: Low Risk  (10/11/2023)  Recent Concern: Tobacco Use - Medium Risk (08/06/2023)   Received from [facility]   SDOH Interventions:     Readmission Risk Interventions    10/14/2023    5:00 PM  Readmission Risk Prevention Plan  Post Dischage Appt Complete  Medication Screening Complete  Transportation Screening Complete

## 2023-10-14 NOTE — Progress Notes (Signed)
NAME:  Devyani Saelee, MRN:  [id number], DOB:  [birth year], LOS: 5 ADMISSION DATE:  10/09/2023, CONSULTATION DATE:  10/09/2023 REFERRING MD:  Dr Theresia Lo, CHIEF COMPLAINT:  Mucus plugging   History of Present Illness:  Chenille Parde is a 27 y.o. female with an extensive PMH significant  for Retts Syndrome, seizure disorder, cerebral palsy, and chronic hypoxic respiratory failure on 0.5-1L [place] at baseline who presented to the ED 11/22 for worsening hypoxia, cough, SOB, and fatigue. Patient was seen by PCP who obtained a CXR that was concerning for PNA and prompted family to present to the ED. On ED arrival patient was seen with mild tachycardia with all other vitals WNL on 6L [place]  Patient was admitted per [facility] for management of CAP    CTA chest obtained and negative for PE but positive for right sisded PNA and near complete collapse of left in the setting of mucus plug. PCCM consulted for assistance in management.    Pertinent  Medical History  Retts Syndrome, seizure disorder, cerebral palsy, and chronic hypoxic respiratory failure on 0.5-1L [place] at baseline   Significant Hospital Events: Including procedures, antibiotic start and stop dates in addition to other pertinent events   11/22 Presented at PCP recommendation for management of CAP. CTA chest obtained and negative for PE but positive for right sisded PNA and near complete collapse of left in the setting of mucus plug.  11/23 Improved left upper lobe aeration 11/25 had multiple seizures, bronchoscopy had been planned but had to be canceled   Antibiotics: -Levaquin x 4 days -Switch to Rocephin and azithromycin to complete 7 days of treatment -Positive for rhinovirus  Interim History / Subjective:  Occasional seizures which is her usual Remains on oxygen supplementation No other overnight events  Objective   Blood pressure (!) 125/57, pulse 97, temperature 97.9 F (36.6 C), temperature source Axillary, resp. rate (!) 30, height 4\' 11"   (1.499 m), weight 46 kg, SpO2 100%.        Intake/Output Summary (Last 24 hours) at 10/14/2023 0954 Last data filed at 10/13/2023 2230 Gross per 24 hour  Intake 712.82 ml  Output --  Net 712.82 ml   Filed Weights   10/09/23 1217 10/09/23 1438  Weight: 45.8 kg 46 kg    Examination: General: Chronically ill-appearing, does not appear to be in distress HENT: Nasal cannula remains in place Lungs: Diminished air entry bilaterally, no rales, no wheezing Cardiovascular: S1-S2 appreciated Abdomen: Soft, bowel sounds appreciated Extremities: no edema Neuro: Sleeping, did not arouse easily GU:   I reviewed nursing notes, Consultant notes, hospitalist notes, last 24 h vitals and pain scores, last 48 h intake and output, last 24 h labs and trends, and last 24 h imaging results. Reviewed most recent chest x-ray  Resolved Hospital Problem list     Assessment & Plan:   Acute on chronic hypoxic and hypercapnic respiratory failure -On 0.5 to 1 L of oxygen at baseline, requiring about Acute on chronic hypoxic and hypercapnic respiratory failure -At baseline on 0.5 to 1 L of oxygen, requiring increased oxygen supplementation  Community-acquired pneumonia Left-sided mucous plugging -Will continue aggressive pulmonary toileting, chest PT, nebulization treatment, hypertonic saline -Will need a bronchoscopy to clear as conservative measures do not appear to be helping/mobilize forward currently -Antibiotics was changed to complete 7 days of antibiotic therapy  Seizures -Appreciate neurology evaluation -Continue current medications -Continue antiseizure medications -Current medications include clonazepam for breakthrough seizures, Depakote, Keppra, Epidiolex -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 -resolving -continue antibiotics-to complete 7 days  Maintain head of the bed elevation at 30 degrees  Follow chest x-ray  I did update patient's mother about ongoing chest x-ray findings which are not currently improving The only way to safely intervene will be intubation and bronchoscopy which may be multiple unfortunately She is at present not able to cough and expectorate Because of the risk associated with intubation and further management, I did let her know that she can think about it and if she has any specific questions we can talk about it further before proceeding however, we do not appear to have any other logical choices. We can continue to conservatively treat, however, the lung is not opening up at present   Best Practice (right click and "Reselect all SmartList Selections" daily)   Per primary  Virl Diamond, MD [place] [facility] Pager: See Loretha Stapler

## 2023-10-14 NOTE — Plan of Care (Signed)
Patient is non-verbal. Goals for applicable items refer to family/guardian.  Problem: Education: Goal: Knowledge of General Education information will improve Description: Including pain rating scale, medication(s)/side effects and non-pharmacologic comfort measures Outcome: Progressing   Problem: Health Behavior/Discharge Planning: Goal: Ability to manage health-related needs will improve Outcome: Progressing   Problem: Clinical Measurements: Goal: Ability to maintain clinical measurements within normal limits will improve Outcome: Progressing Goal: Will remain free from infection Outcome: Progressing Goal: Diagnostic test results will improve Outcome: Progressing Goal: Respiratory complications will improve Outcome: Progressing Goal: Cardiovascular complication will be avoided Outcome: Progressing   Problem: Activity: Goal: Risk for activity intolerance will decrease Outcome: Progressing   Problem: Nutrition: Goal: Adequate nutrition will be maintained Outcome: Progressing   Problem: Coping: Goal: Level of anxiety will decrease Outcome: Progressing   Problem: Elimination: Goal: Will not experience complications related to bowel motility Outcome: Progressing Goal: Will not experience complications related to urinary retention Outcome: Progressing   Problem: Pain Management: Goal: General experience of comfort will improve Outcome: Progressing   Problem: Safety: Goal: Ability to remain free from injury will improve Outcome: Progressing   Problem: Skin Integrity: Goal: Risk for impaired skin integrity will decrease Outcome: Progressing   Problem: Education: Goal: Expressions of having a comfortable level of knowledge regarding the disease process will increase Outcome: Progressing   Problem: Coping: Goal: Ability to adjust to condition or change in health will improve Outcome: Progressing Goal: Ability to identify appropriate support needs will  improve Outcome: Progressing   Problem: Health Behavior/Discharge Planning: Goal: Compliance with prescribed medication regimen will improve Outcome: Progressing   Problem: Medication: Goal: Risk for medication side effects will decrease Outcome: Progressing   Problem: Clinical Measurements: Goal: Complications related to the disease process, condition or treatment will be avoided or minimized Outcome: Progressing Goal: Diagnostic test results will improve Outcome: Progressing   Problem: Safety: Goal: Verbalization of understanding the information provided will improve Outcome: Progressing   Problem: Self-Concept: Goal: Level of anxiety will decrease Outcome: Progressing Goal: Ability to verbalize feelings about condition will improve Outcome: Progressing   Cindy S. Clelia Croft BSN, RN, CCRP, CCRN 10/14/2023 1:09 AM

## 2023-10-14 NOTE — Plan of Care (Signed)
Problem: Education: Goal: Knowledge of General Education information will improve Description: Including pain rating scale, medication(s)/side effects and non-pharmacologic comfort measures Outcome: Progressing   Problem: Health Behavior/Discharge Planning: Goal: Ability to manage health-related needs will improve Outcome: Progressing   Problem: Clinical Measurements: Goal: Ability to maintain clinical measurements within normal limits will improve Outcome: Progressing Goal: Will remain free from infection Outcome: Progressing Goal: Diagnostic test results will improve Outcome: Progressing Goal: Respiratory complications will improve Outcome: Progressing Goal: Cardiovascular complication will be avoided Outcome: Progressing   Problem: Nutrition: Goal: Adequate nutrition will be maintained Outcome: Progressing   Problem: Activity: Goal: Risk for activity intolerance will decrease Outcome: Progressing   Problem: Elimination: Goal: Will not experience complications related to bowel motility Outcome: Progressing Goal: Will not experience complications related to urinary retention Outcome: Progressing

## 2023-10-14 NOTE — Progress Notes (Signed)
PROGRESS NOTE    Jo Matthews  NFA:[id number] DOB: [birth year] DOA: 10/09/2023 PCP: Georgiann Hahn, MD   Brief Narrative: Jo Matthews is a 27 y.o. female with a history of Rett syndrome,  seizure disorder, chronic respiratory failure with hypoxia presented to the hospital secondary to hypoxia and found to have sepsis secondary to pneumonia with associated mucous plugging. Levaquin was started. Pulmonology consulted. Patient was started on conservative treatment with aggressive pulmonary toilet with nebulizer, hypertonic saline and chest PT treatments.  Assessment and Plan:  Sepsis secondary to pneumonia. Patient had leukocytosis fever with respiratory failure secondary to pneumonia.  Blood cultures negative in 5 days.  Continue antibiotic.  Community acquired pneumonia CT scan with right lower lobe pneumonia.  Was empirically on Levaquin which was discontinued due to possibility of lowering seizure threshold.  Currently on Rocephin and Zithromax to complete 7-day course.  Procalcitonin of 1.2.  WBC at 7.5.  Rhinovirus detected in respiratory viral panel.  Mild leukocytosis at 11.1.  Acute on chronic respiratory failure with hypoxia Secondary to pneumonia and mucous plugging. Patient uses about 0.5 - 1L/min baseline.  Required up to 5 L on admission now at 2 L/min.  Follow-up chest x-ray with worsened airspace disease of left upper lobe. Pulmonology on board and had plans for bronchoscopy which family has refused at this time. -Continue weaning to baseline 1 L/min as able  Mucous plugging Contributing to respiratory failure.  Continue aggressive pulmonary toilet. Bronchoscopy was planned on 11/25, however patient developed seizure and was postictal.  Continue chest PT, hypertonic saline nebulizer treatments, Mucomyst.  Chest x-ray from today shows a persistent opacification of the left hemithorax..  Pulmonary has seen the patient today and recommendation is bronchoscopy again.  Patient's  mother at bedside is here to make decision for bronchoscopy.  Chronic anemia Hemoglobin is stable at baseline.  Latest hemoglobin of 9.5 from 9.3..  Mild hypokalemia.  Will replace.  Check levels in AM.  Hepatomegaly Hepatic steatosis Noted on scan.  Cardiomegaly No signs of heart failure.  Continue to monitor.  Dysphagia -Continue dysphagia 1 diet and tube feeding.  Seizure disorder Rett syndrome Patient with multiple breakthrough seizures while on home medications.  On Keppra and Depakote perampanel.  Neurology had recommended [facility] but family had declined.  At this time neurology has followed the patient and no further recommendations except for continuation of antiepileptics.  Continue Diazepam/Klonopin PRN for seizures   DVT prophylaxis: Lovenox subcu  Code Status:   Code Status: Full Code  Family Communication:  Spoke with the patient's mother at bedside.  Disposition Plan:   Likely home uncertain at this time.   Consultants: Pulmonology Neurology  Procedures:  EEG  Antimicrobials: Rocephin and Zithromax   Subjective: Today, patient was seen and examined at bedside.  Patient's mother at bedside.  States that that she is not quite at her baseline but slightly improved mentation.  Chest x-ray with persistent left opacification.   Objective: BP 121/76   Pulse 100   Temp 97.9 F (36.6 C) (Axillary)   Resp 20   Ht 4\' 11"  (1.499 m)   Wt 46 kg   SpO2 100%   BMI 20.48 kg/m   Physical examination: General:  Average built, not in obvious distress, on nasal cannula oxygen, chronically ill, HENT:   No scleral pallor or icterus noted. Oral mucosa is moist.  Chest:    Diminished breath sounds bilaterally.  CVS: S1 &S2 heard. No murmur.  Regular rate and rhythm. Abdomen: Soft, nontender, nondistended.  Bowel sounds are heard.  PEG tube in place. Extremities: No cyanosis, clubbing or edema.  Peripheral pulses are palpable. Psych:  Alert, awake  CNS: Moves extremities.  Contracted extremities Skin: Warm and dry.    Data Reviewed: I have personally reviewed following labs and imaging studies  CBC Lab Results  Component Value Date   WBC 11.1 (H) 10/14/2023   RBC 2.85 (L) 10/14/2023   HGB 9.5 (L) 10/14/2023   HCT 30.9 (L) 10/14/2023   MCV 108.4 (H) 10/14/2023   MCH 33.3 10/14/2023   PLT 226 10/14/2023   MCHC 30.7 10/14/2023   RDW 12.3 10/14/2023   LYMPHSABS 3,330 12/26/2022   MONOABS 0.4 05/03/2021   EOSABS 259 12/26/2022   BASOSABS 52 12/26/2022     Last metabolic panel Lab Results  Component Value Date   NA 142 10/14/2023   K 3.3 (L) 10/14/2023   CL 94 (L) 10/14/2023   CO2 39 (H) 10/14/2023   BUN <5 (L) 10/14/2023   CREATININE <0.30 (L) 10/14/2023   GLUCOSE 117 (H) 10/14/2023   GFRNONAA NOT CALCULATED 10/14/2023   GFRAA >60 08/05/2017   CALCIUM 8.8 (L) 10/14/2023   PHOS 2.8 12/06/2014   PROT 6.3 (L) 10/12/2023   ALBUMIN 2.9 (L) 10/12/2023   BILITOT 0.4 10/12/2023   ALKPHOS 38 10/12/2023   AST 17 10/12/2023   ALT 11 10/12/2023   ANIONGAP 9 10/14/2023    GFR: CrCl cannot be calculated (This lab value cannot be used to calculate CrCl because it is not a number: <0.30).  Recent Results (from the past 240 hour(s))  Resp panel by RT-PCR (RSV, Flu A&B, Covid) Anterior Nasal Swab     Status: None   Collection Time: 10/09/23  1:10 PM   Specimen: Anterior Nasal Swab  Result Value Ref Range Status   SARS Coronavirus 2 by RT PCR NEGATIVE NEGATIVE Final    Comment: (NOTE) SARS-CoV-2 target nucleic acids are NOT DETECTED.  The SARS-CoV-2 RNA is generally detectable in upper respiratory specimens during the acute phase of infection. The lowest concentration of SARS-CoV-2 viral copies this assay can detect is 138 copies/mL. A negative result does not preclude SARS-Cov-2 infection and should not be used as the sole basis for treatment or other patient management decisions. A negative result may  occur with  improper specimen collection/handling, submission of specimen other than nasopharyngeal swab, presence of viral mutation(s) within the areas targeted by this assay, and inadequate number of viral copies(<138 copies/mL). A negative result must be combined with clinical observations, patient history, and epidemiological information. The expected result is Negative.  Fact Sheet for Patients:  BloggerCourse.com  Fact Sheet for Healthcare Providers:  SeriousBroker.it  This test is no t yet approved or cleared by the [place] FDA and  has been authorized for detection and/or diagnosis of SARS-CoV-2 by FDA under an Emergency Use Authorization (EUA). This EUA will remain  in effect (meaning this test can be used) for the duration of the COVID-19 declaration under Section 564(b)(1) of the Act, 21 U.S.C.section 360bbb-3(b)(1), unless the authorization is terminated  or revoked sooner.       Influenza A by PCR NEGATIVE NEGATIVE Final   Influenza B by PCR NEGATIVE NEGATIVE Final    Comment: (NOTE) The Xpert Xpress SARS-CoV-2/FLU/RSV plus assay is intended as an aid in the diagnosis of influenza from Nasopharyngeal swab specimens and should not be used as a sole basis for treatment. Nasal washings and  aspirates are unacceptable for Xpert Xpress SARS-CoV-2/FLU/RSV testing.  Fact Sheet for Patients: BloggerCourse.com  Fact Sheet for Healthcare Providers: SeriousBroker.it  This test is not yet approved or cleared by the [place] FDA and has been authorized for detection and/or diagnosis of SARS-CoV-2 by FDA under an Emergency Use Authorization (EUA). This EUA will remain in effect (meaning this test can be used) for the duration of the COVID-19 declaration under Section 564(b)(1) of the Act, 21 U.S.C. section 360bbb-3(b)(1), unless the authorization is terminated  or revoked.     Resp Syncytial Virus by PCR NEGATIVE NEGATIVE Final    Comment: (NOTE) Fact Sheet for Patients: BloggerCourse.com  Fact Sheet for Healthcare Providers: SeriousBroker.it  This test is not yet approved or cleared by the [place] FDA and has been authorized for detection and/or diagnosis of SARS-CoV-2 by FDA under an Emergency Use Authorization (EUA). This EUA will remain in effect (meaning this test can be used) for the duration of the COVID-19 declaration under Section 564(b)(1) of the Act, 21 U.S.C. section 360bbb-3(b)(1), unless the authorization is terminated or revoked.  Performed at [facility], [address] [place], [place] [address]   Blood culture (routine x 2)     Status: None (Preliminary result)   Collection Time: 10/09/23  1:20 PM   Specimen: BLOOD  Result Value Ref Range Status   Specimen Description   Final    BLOOD RIGHT ANTECUBITAL Performed at [facility], [address] [place], [place] [address]    Special Requests   Final    BOTTLES DRAWN AEROBIC AND ANAEROBIC Blood Culture results may not be optimal due to an excessive volume of blood received in culture bottles Performed at [facility], [address] [place], [place] [address]    Culture   Final    NO GROWTH 4 DAYS Performed at [facility], [address], [place], [place] [address]    Report Status PENDING  Incomplete  Blood culture (routine x 2)     Status: None (Preliminary result)   Collection Time: 10/09/23  1:30 PM   Specimen: BLOOD  Result Value Ref Range Status   Specimen Description   Final    BLOOD LEFT ANTECUBITAL Performed at [facility], [address] [place], [place] [address]    Special Requests   Final    BOTTLES DRAWN AEROBIC AND ANAEROBIC Blood Culture adequate volume Performed at [facility], [address], [place], [place] [address]    Culture   Final    NO GROWTH 4 DAYS Performed at [facility], [address] [place], [place] [address]    Report Status PENDING  Incomplete  Respiratory (~20 pathogens) panel by PCR     Status: Abnormal   Collection Time: 10/12/23  6:36 PM   Specimen: Nasopharyngeal Swab; Respiratory  Result Value Ref Range Status   Adenovirus NOT DETECTED NOT DETECTED Final   Coronavirus 229E NOT DETECTED NOT DETECTED Final    Comment: (NOTE) The Coronavirus on the Respiratory Panel, DOES NOT test for the novel  Coronavirus (2019 nCoV)    Coronavirus HKU1 NOT DETECTED NOT DETECTED Final   Coronavirus NL63 NOT DETECTED NOT DETECTED Final   Coronavirus OC43 NOT DETECTED NOT DETECTED Final   Metapneumovirus NOT DETECTED NOT DETECTED Final   Rhinovirus / Enterovirus DETECTED (A) NOT DETECTED Final   Influenza A NOT DETECTED NOT DETECTED Final   Influenza B NOT  DETECTED NOT DETECTED Final   Parainfluenza Virus 1 NOT DETECTED NOT DETECTED Final   Parainfluenza Virus 2 NOT DETECTED NOT DETECTED Final   Parainfluenza Virus 3 NOT DETECTED NOT DETECTED Final   Parainfluenza Virus 4 NOT DETECTED NOT DETECTED Final   Respiratory Syncytial Virus NOT DETECTED NOT DETECTED Final   Bordetella pertussis NOT DETECTED NOT DETECTED Final   Bordetella Parapertussis NOT DETECTED NOT DETECTED Final   Chlamydophila pneumoniae NOT DETECTED NOT DETECTED Final   Mycoplasma pneumoniae NOT DETECTED NOT DETECTED Final    Comment: Performed at [facility], [address], [place], [place] [address]  MRSA Next Gen by PCR, Nasal     Status: None   Collection Time: 11/10/2023  1:09 AM   Specimen: Nasal Mucosa; Nasal Swab  Result Value Ref Range Status   MRSA by PCR Next Gen NOT DETECTED NOT DETECTED Final    Comment: (NOTE) The GeneXpert MRSA Assay (FDA approved for NASAL specimens only), is one component of a comprehensive MRSA colonization surveillance program. It is  not intended to diagnose MRSA infection nor to guide or monitor treatment for MRSA infections. Test performance is not FDA approved in patients less than 69 years old. Performed at [facility], [address] [place], [place] [address]       Radiology Studies: Rapid EEG  Result Date: 2023-11-10 Charlsie Quest, MD     11/10/2023 10:37 AM Patient Name: Jo Matthews MRN: [id number] Epilepsy Attending: Charlsie Quest Referring Physician/Provider: Milon Dikes, MD Duration: 10/12/2023 1215 to 11/10/2023 0520 Patient history: 27 yo F with history of Retts, breakthrough seizures in the setting of acute pna, no med changes but still having frequent seizures. EEG to evaluate for seizure Level of alertness: Awake, asleep AEDs during EEG study: CBD, Clonazepam, VPA, LEV, Perampanel Technical aspects: This EEG was obtained using a 10 lead EEG system positioned circumferentially without any parasagittal coverage (rapid EEG). Computer selected EEG is reviewed as  well as background features and all clinically significant events. Description: No clear posterior dominant rhythm was seen.  Sleep was characterized by sleep symptoms (12 to 14 Hz), maximal frontocentral region. EEG showed continuous generalized polymorphic 3 to 6 Hz theta-delta slowing. Generalized periodic discharges with overriding fast activity at 1-1.5 Hz were also noted. Hyperventilation and photic stimulation were not performed.   EEG was disconnected between 10/12/2023 1757 to November 10, 2023 0118. 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 This EEG pattern is on the ictal-interictal continuum and increased risk of seizures. Additionally there is moderate diffuse encephalopathy. Recommend conventional eeg for further evaluation  Priyanka [name]      LOS: 5 days     Loistine Chance, MD [facility] 10/14/2023, 7:27 AM If 7PM-7AM, please contact night-coverage [email or web address]

## 2023-10-14 NOTE — Progress Notes (Signed)
Patient's mother called out to notify staff patient was having a seizure. By the time RN was in room (10:31), patient had no seizure-like movements/activity except for eye deviation. Mother reports seizure activity lasted about 2 minutes (started 10:29 until 10:31, with eye deviation lasting until 10:35). No changes in vital signs. Patient now resting comfortably.

## 2023-10-15 ENCOUNTER — Inpatient Hospital Stay (HOSPITAL_COMMUNITY): Payer: Medicaid Other

## 2023-10-15 DIAGNOSIS — J189 Pneumonia, unspecified organism: Secondary | ICD-10-CM | POA: Diagnosis not present

## 2023-10-15 DIAGNOSIS — J9621 Acute and chronic respiratory failure with hypoxia: Secondary | ICD-10-CM | POA: Diagnosis not present

## 2023-10-15 DIAGNOSIS — G40919 Epilepsy, unspecified, intractable, without status epilepticus: Secondary | ICD-10-CM | POA: Diagnosis not present

## 2023-10-15 DIAGNOSIS — J9622 Acute and chronic respiratory failure with hypercapnia: Secondary | ICD-10-CM | POA: Diagnosis not present

## 2023-10-15 LAB — BASIC METABOLIC PANEL
Anion gap: 10 (ref 5–15)
BUN: 6 mg/dL (ref 6–20)
CO2: 34 mmol/L — ABNORMAL HIGH (ref 22–32)
Calcium: 9.1 mg/dL (ref 8.9–10.3)
Chloride: 91 mmol/L — ABNORMAL LOW (ref 98–111)
Creatinine, Ser: <0.3 mg/dL — ABNORMAL LOW (ref 0.44–1.00)
Glucose, Bld: 101 mg/dL — ABNORMAL HIGH (ref 70–99)
Potassium: 4.8 mmol/L (ref 3.5–5.1)
Sodium: 135 mmol/L (ref 135–145)

## 2023-10-15 LAB — CBC
HCT: 31.2 % — ABNORMAL LOW (ref 36.0–46.0)
Hemoglobin: 9.9 g/dL — ABNORMAL LOW (ref 12.0–15.0)
MCH: 33.3 pg (ref 26.0–34.0)
MCHC: 31.7 g/dL (ref 30.0–36.0)
MCV: 105.1 fL — ABNORMAL HIGH (ref 80.0–100.0)
Platelets: 348 10*3/uL (ref 150–400)
RBC: 2.97 MIL/uL — ABNORMAL LOW (ref 3.87–5.11)
RDW: 12.6 % (ref 11.5–15.5)
WBC: 11.6 10*3/uL — ABNORMAL HIGH (ref 4.0–10.5)
nRBC: 0.3 % — ABNORMAL HIGH (ref 0.0–0.2)

## 2023-10-15 LAB — MAGNESIUM: Magnesium: 2.5 mg/dL — ABNORMAL HIGH (ref 1.7–2.4)

## 2023-10-15 NOTE — Progress Notes (Signed)
PROGRESS NOTE    Jo Matthews  NFA:[id number] DOB: Jul 26, 1996 DOA: 10/09/2023 PCP: Georgiann Hahn, MD   Brief Narrative:  Jo Matthews is a 27 y.o. female with a history of Rett syndrome,  seizure disorder, chronic respiratory failure with hypoxia presented to the hospital secondary to hypoxia and found to have sepsis secondary to pneumonia with associated mucous plugging. Levaquin was started. Pulmonology was consulted and  was started on conservative treatment with aggressive pulmonary toilet with nebulizer, hypertonic saline and chest PT treatments.  Assessment and Plan:  Sepsis secondary to pneumonia. Patient had leukocytosis fever with respiratory failure secondary to pneumonia.  Blood cultures negative in 5 days.  Continue antibiotic.  Community acquired pneumonia CT scan with right lower lobe pneumonia.  Was empirically on Levaquin which was discontinued due to possibility of lowering seizure threshold.  Currently on Rocephin and Zithromax to complete 7-day course.  Procalcitonin of 1.2.  WBC at 7.5.  Rhinovirus detected in respiratory viral panel.  Mild leukocytosis at 11.1.  Acute on chronic respiratory failure with hypoxia Secondary to pneumonia and mucous plugging. Patient uses about 0.5 - 1L/min baseline.  Required up to 5 L on admission now at 2 L/min.  Follow-up chest x-ray with worsened airspace disease of left upper lobe so pulmonology on board and has plans for bronchoscopy. Continue weaning to baseline 1 L/min as able  Mucous plugging  Continue aggressive pulmonary toilet. Bronchoscopy was planned on 11/25, however patient developed seizure and was postictal.  Continue chest PT, hypertonic saline nebulizer treatments, Mucomyst.  Chest x-ray from today shows a persistent opacification of the left hemithorax..  Pulmonary has seen the patient today and recommendation is bronchoscopy on 10/16/2023.   Chronic anemia Hemoglobin is stable at baseline.  Latest hemoglobin of  9.5 from 9.3..  Mild hypokalemia.  Improved after repletion.  Hepatomegaly Hepatic steatosis Noted on scan.  Cardiomegaly No signs of heart failure.  Continue to monitor.  Dysphagia N.p.o. at this time, continue tube feeding  Seizure disorder Rett syndrome Patient with multiple breakthrough seizures while on home medications.  On Keppra and Depakote perampanel.  Neurology had recommended transfer to [facility] and [facility] for [facility] but family had declined.  At this time neurology has followed the patient and no further recommendations except for continuation of antiepileptics.  Continue Diazepam/Klonopin PRN for seizures no further seizures reported.   DVT prophylaxis: Lovenox subcu  Code Status:   Code Status: Full Code  Family Communication:  Spoke with the patient's father at bedside.  Disposition Plan:  Home, uncertain at this time.   Consultants: Pulmonology Neurology  Procedures:  EEG  Antimicrobials: Rocephin and Zithromax   Subjective: Today, patient was seen and examined at bedside.  Used to be mildly somnolent.  Patient's father at bedside states that she is nearing her baseline.  Father states that plan for bronchoscopy tomorrow.     Objective: BP (!) 108/56   Pulse (!) 103   Temp 97.7 F (36.5 C) (Axillary)   Resp (!) 28   Ht 4\' 11"  (1.499 m)   Wt 46 kg   SpO2 100%   BMI 20.48 kg/m   Physical examination: General:  Average built, not in obvious distress, on nasal cannula oxygen, chronically ill, HENT:   No scleral pallor or icterus noted. Oral mucosa is moist.  Chest:    Diminished breath sounds bilaterally.  CVS: S1 &S2 heard. No murmur.  Regular rate and rhythm. Abdomen: Soft, nontender, nondistended.  Bowel sounds are heard.  PEG  tube in place. Extremities: No cyanosis, clubbing or edema.  Peripheral pulses are palpable. Psych: Mildly somnolent. CNS: Moves extremities.  Contracted extremities Skin: Warm and dry.    Data Reviewed: I have  personally reviewed following labs and imaging studies  CBC Lab Results  Component Value Date   WBC 11.6 (H) 10/15/2023   RBC 2.97 (L) 10/15/2023   HGB 9.9 (L) 10/15/2023   HCT 31.2 (L) 10/15/2023   MCV 105.1 (H) 10/15/2023   MCH 33.3 10/15/2023   PLT 348 10/15/2023   MCHC 31.7 10/15/2023   RDW 12.6 10/15/2023   LYMPHSABS 3,330 12/26/2022   MONOABS 0.4 05/03/2021   EOSABS 259 12/26/2022   BASOSABS 52 12/26/2022     Last metabolic panel Lab Results  Component Value Date   NA 135 10/15/2023   K 4.8 10/15/2023   CL 91 (L) 10/15/2023   CO2 34 (H) 10/15/2023   BUN 6 10/15/2023   CREATININE <0.30 (L) 10/15/2023   GLUCOSE 101 (H) 10/15/2023   GFRNONAA NOT CALCULATED 10/15/2023   GFRAA >60 08/05/2017   CALCIUM 9.1 10/15/2023   PHOS 2.8 12/06/2014   PROT 6.3 (L) 10/12/2023   ALBUMIN 2.9 (L) 10/12/2023   BILITOT 0.4 10/12/2023   ALKPHOS 38 10/12/2023   AST 17 10/12/2023   ALT 11 10/12/2023   ANIONGAP 10 10/15/2023    GFR: CrCl cannot be calculated (This lab value cannot be used to calculate CrCl because it is not a number: <0.30).  Recent Results (from the past 240 hour(s))  Resp panel by RT-PCR (RSV, Flu A&B, Covid) Anterior Nasal Swab     Status: None   Collection Time: 10/09/23  1:10 PM   Specimen: Anterior Nasal Swab  Result Value Ref Range Status   SARS Coronavirus 2 by RT PCR NEGATIVE NEGATIVE Final    Comment: (NOTE) SARS-CoV-2 target nucleic acids are NOT DETECTED.  The SARS-CoV-2 RNA is generally detectable in upper respiratory specimens during the acute phase of infection. The lowest concentration of SARS-CoV-2 viral copies this assay can detect is 138 copies/mL. A negative result does not preclude SARS-Cov-2 infection and should not be used as the sole basis for treatment or other patient management decisions. A negative result may occur with  improper specimen collection/handling, submission of specimen other than nasopharyngeal swab, presence of viral  mutation(s) within the areas targeted by this assay, and inadequate number of viral copies(<138 copies/mL). A negative result must be combined with clinical observations, patient history, and epidemiological information. The expected result is Negative.  Fact Sheet for Patients:  BloggerCourse.com  Fact Sheet for Healthcare Providers:  SeriousBroker.it  This test is no t yet approved or cleared by the [place] FDA and  has been authorized for detection and/or diagnosis of SARS-CoV-2 by FDA under an Emergency Use Authorization (EUA). This EUA will remain  in effect (meaning this test can be used) for the duration of the COVID-19 declaration under Section 564(b)(1) of the Act, 21 U.S.C.section 360bbb-3(b)(1), unless the authorization is terminated  or revoked sooner.       Influenza A by PCR NEGATIVE NEGATIVE Final   Influenza B by PCR NEGATIVE NEGATIVE Final    Comment: (NOTE) The Xpert Xpress SARS-CoV-2/FLU/RSV plus assay is intended as an aid in the diagnosis of influenza from Nasopharyngeal swab specimens and should not be used as a sole basis for treatment. Nasal washings and aspirates are unacceptable for Xpert Xpress SARS-CoV-2/FLU/RSV testing.  Fact Sheet for Patients: BloggerCourse.com  Fact Sheet for Healthcare  Providers: SeriousBroker.it  This test is not yet approved or cleared by the [place] and has been authorized for detection and/or diagnosis of SARS-CoV-2 by FDA under an Emergency Use Authorization (EUA). This EUA will remain in effect (meaning this test can be used) for the duration of the COVID-19 declaration under Section 564(b)(1) of the Act, 21 U.S.C. section 360bbb-3(b)(1), unless the authorization is terminated or revoked.     Resp Syncytial Virus by PCR NEGATIVE NEGATIVE Final    Comment: (NOTE) Fact Sheet for  Patients: BloggerCourse.com  Fact Sheet for Healthcare Providers: SeriousBroker.it  This test is not yet approved or cleared by the [place] FDA and has been authorized for detection and/or diagnosis of SARS-CoV-2 by FDA under an Emergency Use Authorization (EUA). This EUA will remain in effect (meaning this test can be used) for the duration of the COVID-19 declaration under Section 564(b)(1) of the Act, 21 U.S.C. section 360bbb-3(b)(1), unless the authorization is terminated or revoked.  Performed at [facility], [address] [place], [place] [address]   Blood culture (routine x 2)     Status: None   Collection Time: 10/09/23  1:20 PM   Specimen: BLOOD  Result Value Ref Range Status   Specimen Description   Final    BLOOD RIGHT ANTECUBITAL Performed at [facility], [address] [place], [place] [address]    Special Requests   Final    BOTTLES DRAWN AEROBIC AND ANAEROBIC Blood Culture results may not be optimal due to an excessive volume of blood received in culture bottles Performed at [facility], [address], [place], [place] [address]    Culture   Final    NO GROWTH 5 DAYS Performed at [facility], [address], [place], [place] [address]    Report Status 10/14/2023 FINAL  Final  Blood culture (routine x 2)     Status: None   Collection Time: 10/09/23  1:30 PM   Specimen: BLOOD  Result Value Ref Range Status   Specimen Description   Final    BLOOD LEFT ANTECUBITAL Performed at [facility], [address], [place], [place] [address]    Special Requests   Final    BOTTLES DRAWN AEROBIC AND ANAEROBIC Blood Culture adequate volume Performed at [facility], [address] [place], [place] [address]    Culture   Final    NO GROWTH 5 DAYS Performed at [facility], [address]  [place], [place] [address]    Report Status 10/14/2023 FINAL  Final  Respiratory (~20 pathogens) panel by PCR     Status: Abnormal   Collection Time: 10/12/23  6:36 PM   Specimen: Nasopharyngeal Swab; Respiratory  Result Value Ref Range Status   Adenovirus NOT DETECTED NOT DETECTED Final   Coronavirus 229E NOT DETECTED NOT DETECTED Final    Comment: (NOTE) The Coronavirus on the Respiratory Panel, DOES NOT test for the novel  Coronavirus (2019 nCoV)    Coronavirus HKU1 NOT DETECTED NOT DETECTED Final   Coronavirus NL63 NOT DETECTED NOT DETECTED Final   Coronavirus OC43 NOT DETECTED NOT DETECTED Final   Metapneumovirus NOT DETECTED NOT DETECTED Final   Rhinovirus / Enterovirus DETECTED (A) NOT DETECTED Final   Influenza A NOT DETECTED NOT DETECTED Final   Influenza B NOT DETECTED NOT DETECTED Final   Parainfluenza Virus 1 NOT DETECTED NOT DETECTED Final   Parainfluenza Virus 2 NOT DETECTED  NOT DETECTED Final   Parainfluenza Virus 3 NOT DETECTED NOT DETECTED Final   Parainfluenza Virus 4 NOT DETECTED NOT DETECTED Final   Respiratory Syncytial Virus NOT DETECTED NOT DETECTED Final   Bordetella pertussis NOT DETECTED NOT DETECTED Final   Bordetella Parapertussis NOT DETECTED NOT DETECTED Final   Chlamydophila pneumoniae NOT DETECTED NOT DETECTED Final   Mycoplasma pneumoniae NOT DETECTED NOT DETECTED Final    Comment: Performed at [facility], [address], [place], [place] [address]  MRSA Next Gen by PCR, Nasal     Status: None   Collection Time: 10/13/23  1:09 AM   Specimen: Nasal Mucosa; Nasal Swab  Result Value Ref Range Status   MRSA by PCR Next Gen NOT DETECTED NOT DETECTED Final    Comment: (NOTE) The GeneXpert MRSA Assay (FDA approved for NASAL specimens only), is one component of a comprehensive MRSA colonization surveillance program. It is not intended to diagnose MRSA infection nor to guide or monitor treatment for MRSA infections. Test performance is not FDA  approved in patients less than 20 years old. Performed at [facility], [address], [place], [place] [address]       Radiology Studies: DG CHEST PORT 1 VIEW  Result Date: 10/15/2023 CLINICAL DATA:  27 year old female history of atelectasis of the left lung. EXAM: PORTABLE CHEST 1 VIEW COMPARISON:  Chest x-ray 10/14/2023. FINDINGS: Complete opacification of the left hemithorax. Worsening aeration throughout the right lung. No definite right pleural effusion. No pneumothorax. No evidence of pulmonary edema. Cardiac silhouette is completely obscured. Orthopedic fixation hardware noted throughout the visualized thoracolumbar spine. IMPRESSION: 1. Worsening aeration in the right lung, favored to reflect areas of atelectasis given the low lung volumes, although developing airspace consolidation is not entirely excluded. 2. Complete opacification of the left hemithorax redemonstrated, which may reflect atelectasis and/or consolidation, with superimposed large left pleural effusion. Electronically Signed   By: Trudie Reed M.D.   On: 10/15/2023 10:21   DG Chest Port 1 View  Result Date: 10/14/2023 CLINICAL DATA:  Respiratory failure EXAM: PORTABLE CHEST 1 VIEW COMPARISON:  10/12/2023 FINDINGS: Single frontal view of the chest demonstrates continued opacification of the left hemithorax. There is improved aeration at the right lung base, with minimal residual patchy airspace disease. No right effusion or pneumothorax. Stable thoracolumbar fusion. IMPRESSION: 1. Persistent opacification of the left hemithorax. 2. Improved aeration at the right lung base, with mild residual patchy right basilar airspace disease. Electronically Signed   By: Sharlet Salina M.D.   On: 10/14/2023 09:03      LOS: 6 days    Loistine Chance, MD [facility] 10/15/2023, 2:01 PM If 7PM-7AM, please contact night-coverage [email or web address]

## 2023-10-15 NOTE — Plan of Care (Signed)
  Problem: Clinical Measurements: Goal: Ability to maintain clinical measurements within normal limits will improve Outcome: Progressing Goal: Will remain free from infection Outcome: Progressing Goal: Diagnostic test results will improve Outcome: Progressing Goal: Respiratory complications will improve Outcome: Progressing Goal: Cardiovascular complication will be avoided Outcome: Progressing   Problem: Activity: Goal: Risk for activity intolerance will decrease Outcome: Progressing   Problem: Elimination: Goal: Will not experience complications related to bowel motility Outcome: Progressing Goal: Will not experience complications related to urinary retention Outcome: Progressing   Problem: Pain Management: Goal: General experience of comfort will improve Outcome: Progressing   Problem: Clinical Measurements: Goal: Complications related to the disease process, condition or treatment will be avoided or minimized Outcome: Progressing Goal: Diagnostic test results will improve Outcome: Progressing

## 2023-10-15 NOTE — Progress Notes (Signed)
NAME:  Jo Matthews, MRN:  [id number], DOB:  31-Dec-1995, LOS: 6 ADMISSION DATE:  10/09/2023, CONSULTATION DATE:  10/09/2023 REFERRING MD:  Dr Theresia Lo, CHIEF COMPLAINT:  Mucus plugging   History of Present Illness:  Jo Matthews is a 27 y.o. female with an extensive PMH significant  for Retts Syndrome, seizure disorder, cerebral palsy, and chronic hypoxic respiratory failure on 0.5-1L [place] at baseline who presented to the ED 11/22 for worsening hypoxia, cough, SOB, and fatigue. Patient was seen by PCP who obtained a CXR that was concerning for PNA and prompted family to present to the ED. On ED arrival patient was seen with mild tachycardia with all other vitals WNL on 6L [place]  Patient was admitted per [facility] for management of CAP    CTA chest obtained and negative for PE but positive for right sisded PNA and near complete collapse of left in the setting of mucus plug. PCCM consulted for assistance in management.    Pertinent  Medical History  Retts Syndrome, seizure disorder, cerebral palsy, and chronic hypoxic respiratory failure on 0.5-1L  at baseline   Significant Hospital Events: Including procedures, antibiotic start and stop dates in addition to other pertinent events   11/22 Presented at PCP recommendation for management of CAP. CTA chest obtained and negative for PE but positive for right sisded PNA and near complete collapse of left in the setting of mucus plug.  11/23 Improved left upper lobe aeration 11/25 had multiple seizures, bronchoscopy had been planned but had to be canceled   Antibiotics: -Levaquin x 4 days -Switch to Rocephin and azithromycin to complete 7 days of treatment -Positive for rhinovirus  Interim History / Subjective:  On oxygen supplementation at 2 L with good saturations No other overnight events  Objective   Blood pressure (!) 109/50, pulse 98, temperature 100 F (37.8 C), temperature source Axillary, resp. rate (!) 25, height 4\' 11"  (1.499 m), weight 46 kg,  SpO2 99%.        Intake/Output Summary (Last 24 hours) at 10/15/2023 6213 Last data filed at 10/14/2023 1236 Gross per 24 hour  Intake 50.43 ml  Output --  Net 50.43 ml   Filed Weights   10/09/23 1217 10/09/23 1438  Weight: 45.8 kg 46 kg    Examination: General: Chronically ill-appearing, does not appear to be in distress HENT: Nasal cannula in place Lungs: Diminished air entry bilaterally, no rales, no wheezing Cardiovascular: S1-S2 appreciated Abdomen: Soft bowel sounds appreciated Extremities: no edema Neuro: Sleeping, did not arouse easily GU:   Chest x-ray reviewed showing atelectasis, unchanged from previous  Resolved Hospital Problem list     Assessment & Plan:   Acute on chronic hypoxic and hypercapnic respiratory failure -On oxygen 0.5 to 1 L at baseline, currently requiring 2 L  Persistent atelectasis of the left lung -Ongoing discussions with parents regarding need for bronchoscopy -We may be able to schedule for bronchoscopy 10/16/2023 pending discussions with patient's mother -Spoke with patient's daughter at bedside this morning  Community-acquired pneumonia Left-sided mucous plugging -To complete 7 days of antibiotic therapy -Also positive for rhinovirus  Seizures -Appears to be at baseline. -Continue antiseizure medications  She is afebrile at present  Continue to follow chest x-ray findings  Tentatively will try and schedule for bronchoscopy 10/16/2023  She likely will not be able to expectorate adequately to clear secretions and will require bronchoscopy for the complete atelectasis Ongoing concern is being able to extubate soon after procedure   Best Practice (right click  and "Reselect all SmartList Selections" daily)   Per primary  Virl Diamond, MD [place] [facility] Pager: See Loretha Stapler

## 2023-10-15 NOTE — Plan of Care (Signed)
  Problem: Clinical Measurements: Goal: Cardiovascular complication will be avoided Outcome: Progressing   Problem: Coping: Goal: Level of anxiety will decrease Outcome: Progressing   Problem: Elimination: Goal: Will not experience complications related to bowel motility Outcome: Progressing   Problem: Pain Management: Goal: General experience of comfort will improve Outcome: Progressing   Problem: Medication: Goal: Risk for medication side effects will decrease Outcome: Progressing

## 2023-10-16 ENCOUNTER — Inpatient Hospital Stay (HOSPITAL_COMMUNITY): Payer: Medicaid Other

## 2023-10-16 ENCOUNTER — Inpatient Hospital Stay (HOSPITAL_COMMUNITY): Payer: Medicaid Other | Admitting: Anesthesiology

## 2023-10-16 ENCOUNTER — Encounter (HOSPITAL_COMMUNITY)
Admission: EM | Disposition: A | Discharge status: Home / Self Care | Payer: Self-pay | Source: Home / Self Care | Attending: Internal Medicine

## 2023-10-16 ENCOUNTER — Encounter (HOSPITAL_COMMUNITY): Payer: Self-pay | Admitting: Internal Medicine

## 2023-10-16 DIAGNOSIS — J9621 Acute and chronic respiratory failure with hypoxia: Secondary | ICD-10-CM | POA: Diagnosis not present

## 2023-10-16 DIAGNOSIS — J9622 Acute and chronic respiratory failure with hypercapnia: Secondary | ICD-10-CM | POA: Diagnosis not present

## 2023-10-16 DIAGNOSIS — J189 Pneumonia, unspecified organism: Secondary | ICD-10-CM | POA: Diagnosis not present

## 2023-10-16 DIAGNOSIS — J9811 Atelectasis: Secondary | ICD-10-CM | POA: Diagnosis not present

## 2023-10-16 DIAGNOSIS — G40919 Epilepsy, unspecified, intractable, without status epilepticus: Secondary | ICD-10-CM | POA: Diagnosis not present

## 2023-10-16 HISTORY — PX: VIDEO BRONCHOSCOPY: SHX5072

## 2023-10-16 HISTORY — PX: BRONCHIAL WASHINGS: SHX5105

## 2023-10-16 LAB — BASIC METABOLIC PANEL
Anion gap: 10 (ref 5–15)
BUN: 8 mg/dL (ref 6–20)
CO2: 37 mmol/L — ABNORMAL HIGH (ref 22–32)
Calcium: 9.4 mg/dL (ref 8.9–10.3)
Chloride: 92 mmol/L — ABNORMAL LOW (ref 98–111)
Creatinine, Ser: <0.3 mg/dL — ABNORMAL LOW (ref 0.44–1.00)
Glucose, Bld: 135 mg/dL — ABNORMAL HIGH (ref 70–99)
Potassium: 4.8 mmol/L (ref 3.5–5.1)
Sodium: 139 mmol/L (ref 135–145)

## 2023-10-16 LAB — CBC
HCT: 36.2 % (ref 36.0–46.0)
Hemoglobin: 11.1 g/dL — ABNORMAL LOW (ref 12.0–15.0)
MCH: 32.9 pg (ref 26.0–34.0)
MCHC: 30.7 g/dL (ref 30.0–36.0)
MCV: 107.4 fL — ABNORMAL HIGH (ref 80.0–100.0)
Platelets: 385 10*3/uL (ref 150–400)
RBC: 3.37 MIL/uL — ABNORMAL LOW (ref 3.87–5.11)
RDW: 12.8 % (ref 11.5–15.5)
WBC: 12.2 10*3/uL — ABNORMAL HIGH (ref 4.0–10.5)
nRBC: 0.2 % (ref 0.0–0.2)

## 2023-10-16 LAB — MAGNESIUM: Magnesium: 2.9 mg/dL — ABNORMAL HIGH (ref 1.7–2.4)

## 2023-10-16 SURGERY — VIDEO BRONCHOSCOPY WITHOUT FLUORO
Anesthesia: General | Laterality: Left

## 2023-10-16 MED ORDER — PROPOFOL 10 MG/ML IV BOLUS
INTRAVENOUS | Status: AC
  Filled 2023-10-16: qty 20

## 2023-10-16 MED ORDER — SODIUM CHLORIDE 0.9 % IV SOLN: INTRAVENOUS | Status: DC | PRN

## 2023-10-16 MED ORDER — ACETYLCYSTEINE 20 % IN SOLN
RESPIRATORY_TRACT | Status: AC
  Filled 2023-10-16: qty 4

## 2023-10-16 MED ORDER — PROPOFOL 1000 MG/100ML IV EMUL
INTRAVENOUS | Status: AC
  Filled 2023-10-16: qty 100

## 2023-10-16 MED ORDER — PROPOFOL 500 MG/50ML IV EMUL
INTRAVENOUS | Status: DC | PRN
  Administered 2023-10-16: 100 ug/kg/min via INTRAVENOUS

## 2023-10-16 MED ORDER — ACETYLCYSTEINE 20 % IN SOLN
RESPIRATORY_TRACT | Status: DC | PRN
  Administered 2023-10-16: 4 mL via ORAL

## 2023-10-16 NOTE — Plan of Care (Signed)
Procedure completed today without complications. Patient suitable for transfer to lower level of care. Working towards goals for discharge home

## 2023-10-16 NOTE — Progress Notes (Signed)
   NAME:  Jo Matthews, MRN:  [id number], DOB:  1996-05-20, LOS: 7 ADMISSION DATE:  10/09/2023, CONSULTATION DATE:  10/09/2023 REFERRING MD:  Dr Theresia Lo, CHIEF COMPLAINT:  Mucus plugging   History of Present Illness:  Jo Matthews is a 27 y.o. female with an extensive PMH significant  for Retts Syndrome, seizure disorder, cerebral palsy, and chronic hypoxic respiratory failure on 0.5-1L [place] at baseline who presented to the ED 11/22 for worsening hypoxia, cough, SOB, and fatigue. Patient was seen by PCP who obtained a CXR that was concerning for PNA and prompted family to present to the ED. On ED arrival patient was seen with mild tachycardia with all other vitals WNL on 6L [place]  Patient was admitted per [facility] for management of CAP    CTA chest obtained and negative for PE but positive for right sisded PNA and near complete collapse of left in the setting of mucus plug. PCCM consulted for assistance in management.    Pertinent  Medical History  Retts Syndrome, seizure disorder, cerebral palsy, and chronic hypoxic respiratory failure on 0.5-1L [place] at baseline   Significant Hospital Events: Including procedures, antibiotic start and stop dates in addition to other pertinent events   11/22 Presented at PCP recommendation for management of CAP. CTA chest obtained and negative for PE but positive for right sisded PNA and near complete collapse of left in the setting of mucus plug.  11/23 Improved left upper lobe aeration 11/25 had multiple seizures, bronchoscopy had been planned but had to be canceled   Antibiotics: -Levaquin x 4 days -Switch to Rocephin and azithromycin to complete 7 days of treatment -Positive for rhinovirus  Interim History / Subjective:  On oxygen supplementation at 2 L with good saturations No overnight events Continues to receive pulmonary toileting  Objective   Blood pressure (!) 137/92, pulse 98, temperature 98.2 F (36.8 C), temperature source Axillary, resp. rate 19,  height 4\' 11"  (1.499 m), weight 46 kg, SpO2 99%.        Intake/Output Summary (Last 24 hours) at 10/16/2023 0738 Last data filed at 10/16/2023 0300 Gross per 24 hour  Intake 100 ml  Output 1000 ml  Net -900 ml   Filed Weights   10/09/23 1217 10/09/23 1438  Weight: 45.8 kg 46 kg    Examination: General: Chronically ill-appearing, does not appear to be in distress HENT: Nasal cannula in place Lungs: Diminished air entry bilaterally, no rales, no wheezing Cardiovascular: S1-S2 appreciated Abdomen: Soft bowel sounds appreciated Extremities: no edema Neuro: Alert was is easily GU:   Chest x-ray reviewed showing atelectasis, unchanged from previous 11/29  Resolved Hospital Problem list     Assessment & Plan:   Acute on chronic hypoxic and hypercapnic respiratory failure -On oxygen at 2 L, at baseline on 1 L at home  Persistent atelectasis of the left lung -Scheduled for bronchoscopy today for mucous plugging and persistent atelectasis  Community-acquired pneumonia -Plan is to complete course of antibiotics over 7 days -She is also positive for rhinovirus -Supportive care  History of seizures Appears to be at baseline -Continue antiseizure medications -Difficult to control seizures at baseline with daily seizures  Remains afebrile, clinically stable to undergo bronchoscopy  Updated patient's mother at bedside Did discuss the fact that she may need to remain on the ventilator for a few days   Best Practice (right click and "Reselect all SmartList Selections" daily)   Per primary  Virl Diamond, MD [place] [facility]M Pager: See Loretha Stapler

## 2023-10-16 NOTE — Transfer of Care (Signed)
Immediate Anesthesia Transfer of Care Note  Patient: Jo Matthews  Procedure(s) Performed: VIDEO BRONCHOSCOPY WITHOUT FLUORO (Left) BRONCHIAL WASHINGS  Patient Location: PACU  Anesthesia Type:General  Level of Consciousness: sedated  Airway & Oxygen Therapy: Patient Spontanous Breathing and Patient connected to face mask oxygen  Post-op Assessment: Report given to RN and Post -op Vital signs reviewed and stable  Post vital signs: Reviewed and stable  Last Vitals:  Vitals Value Taken Time  BP    Temp    Pulse    Resp    SpO2      Last Pain:  Vitals:   10/16/23 1113  TempSrc: Temporal  PainSc: 0-No pain         Complications: No notable events documented.

## 2023-10-16 NOTE — Progress Notes (Signed)
External catheter, purewick, removed due to increased unformed bowel movements. At this time using one would add increase of risk of urinary tract infection. Patient has reddened bottom and perineum, uses briefs at home for complete incontinence and total care required. Will leave only brief provided from home on patient with frequent checks to make sure she isn't staying soiled

## 2023-10-16 NOTE — H&P (View-Only) (Signed)
   NAME:  Jo Matthews, MRN:  [id number], DOB:  1996-05-20, LOS: 7 ADMISSION DATE:  10/09/2023, CONSULTATION DATE:  10/09/2023 REFERRING MD:  Dr Theresia Lo, CHIEF COMPLAINT:  Mucus plugging   History of Present Illness:  Jo Matthews is a 27 y.o. female with an extensive PMH significant  for Retts Syndrome, seizure disorder, cerebral palsy, and chronic hypoxic respiratory failure on 0.5-1L [place] at baseline who presented to the ED 11/22 for worsening hypoxia, cough, SOB, and fatigue. Patient was seen by PCP who obtained a CXR that was concerning for PNA and prompted family to present to the ED. On ED arrival patient was seen with mild tachycardia with all other vitals WNL on 6L [place]  Patient was admitted per [facility] for management of CAP    CTA chest obtained and negative for PE but positive for right sisded PNA and near complete collapse of left in the setting of mucus plug. PCCM consulted for assistance in management.    Pertinent  Medical History  Retts Syndrome, seizure disorder, cerebral palsy, and chronic hypoxic respiratory failure on 0.5-1L [place] at baseline   Significant Hospital Events: Including procedures, antibiotic start and stop dates in addition to other pertinent events   11/22 Presented at PCP recommendation for management of CAP. CTA chest obtained and negative for PE but positive for right sisded PNA and near complete collapse of left in the setting of mucus plug.  11/23 Improved left upper lobe aeration 11/25 had multiple seizures, bronchoscopy had been planned but had to be canceled   Antibiotics: -Levaquin x 4 days -Switch to Rocephin and azithromycin to complete 7 days of treatment -Positive for rhinovirus  Interim History / Subjective:  On oxygen supplementation at 2 L with good saturations No overnight events Continues to receive pulmonary toileting  Objective   Blood pressure (!) 137/92, pulse 98, temperature 98.2 F (36.8 C), temperature source Axillary, resp. rate 19,  height 4\' 11"  (1.499 m), weight 46 kg, SpO2 99%.        Intake/Output Summary (Last 24 hours) at 10/16/2023 0738 Last data filed at 10/16/2023 0300 Gross per 24 hour  Intake 100 ml  Output 1000 ml  Net -900 ml   Filed Weights   10/09/23 1217 10/09/23 1438  Weight: 45.8 kg 46 kg    Examination: General: Chronically ill-appearing, does not appear to be in distress HENT: Nasal cannula in place Lungs: Diminished air entry bilaterally, no rales, no wheezing Cardiovascular: S1-S2 appreciated Abdomen: Soft bowel sounds appreciated Extremities: no edema Neuro: Alert was is easily GU:   Chest x-ray reviewed showing atelectasis, unchanged from previous 11/29  Resolved Hospital Problem list     Assessment & Plan:   Acute on chronic hypoxic and hypercapnic respiratory failure -On oxygen at 2 L, at baseline on 1 L at home  Persistent atelectasis of the left lung -Scheduled for bronchoscopy today for mucous plugging and persistent atelectasis  Community-acquired pneumonia -Plan is to complete course of antibiotics over 7 days -She is also positive for rhinovirus -Supportive care  History of seizures Appears to be at baseline -Continue antiseizure medications -Difficult to control seizures at baseline with daily seizures  Remains afebrile, clinically stable to undergo bronchoscopy  Updated patient's mother at bedside Did discuss the fact that she may need to remain on the ventilator for a few days   Best Practice (right click and "Reselect all SmartList Selections" daily)   Per primary  Virl Diamond, MD [place] PCCM Pager: See Loretha Stapler

## 2023-10-16 NOTE — Progress Notes (Signed)
PROGRESS NOTE    Jo Matthews  MVH:[id number] DOB: Aug 20, 1996 DOA: 10/09/2023 PCP: Georgiann Hahn, MD   Brief Narrative:  Jo Matthews is a 27 y.o. female with a history of Rett syndrome,  seizure disorder, chronic respiratory failure with hypoxia presented to the hospital secondary to hypoxia and found to have sepsis secondary to pneumonia with associated mucous plugging. Levaquin was started. Pulmonology was consulted and  was started on conservative treatment with aggressive pulmonary toilet with nebulizer, hypertonic saline and chest PT treatments.  Due to persistent lung opacification plan is bronchoscopy at this time.  Assessment and Plan:  Sepsis secondary to pneumonia. Patient had leukocytosis fever with respiratory failure secondary to pneumonia.  Blood cultures negative in 5 days.  Continue Rocephin and Zithromax  Community acquired pneumonia CT scan with right lower lobe pneumonia.  Was empirically on Levaquin which was discontinued due to possibility of lowering seizure threshold.  Currently on Rocephin and Zithromax to complete 7-day course.  Procalcitonin of 1.2.  WBC at 7.5.  Rhinovirus detected in respiratory viral panel.  Mild leukocytosis at 12.2 today from 11.1 yesterday.  No fever  Acute on chronic respiratory failure with hypoxia Secondary to pneumonia and mucous plugging. Patient uses about 0.5 - 1L/min baseline.  Required up to 5 L on admission now at 2 L/min.  Follow-up chest x-ray with worsened airspace disease of left upper lobe so pulmonology on board and has plans for bronchoscopy on 10/16/2023. Continue weaning to baseline 1 L/min as able  Mucous plugging  Continue aggressive pulmonary toilet. Bronchoscopy was planned on 11/25, however patient developed seizure and was postictal.  Continue chest PT, hypertonic saline nebulizer treatments, Mucomyst.  Chest x-ray from today shows a persistent opacification of the left hemithorax..  Pulmonary has seen the patient  today and recommendation is bronchoscopy on 10/16/2023.   Chronic anemia Hemoglobin is stable at baseline.  Latest hemoglobin of 11.1   Mild hypokalemia.  Improved after repletion.  Latest potassium of 4.8  Hepatomegaly Hepatic steatosis Noted on scan.  Cardiomegaly No signs of heart failure.  Continue to monitor.  Dysphagia N.p.o. at this time, continue tube feeding  Seizure disorder Rett syndrome Patient with multiple breakthrough seizures while on home medications.  On Keppra and Depakote perampanel.  Neurology had recommended [facility] but family had declined.  At this time neurology has followed the patient and no further recommendations except for continuation of antiepileptics.  Continue Diazepam/Klonopin PRN for seizures no further seizures reported.   DVT prophylaxis: Lovenox subcu  Code Status:   Code Status: Full Code  Family Communication:  Spoke with the patient's mother at bedside  Disposition Plan:  Home, uncertain at this time.   Consultants: Pulmonology Neurology  Procedures:  EEG Plan for bronchoscopy 11/29  Antimicrobials: Rocephin and Zithromax   Subjective: Today, patient was seen and examined at bedside.  Sleeping quietly.  Does not seem to be in discomfort.  Patient's mother at bedside.  No interval complaints reported overnight   Objective: BP (!) 127/98 (BP Location: Right Arm)   Pulse (!) 103   Temp 98.6 F (37 C) (Axillary)   Resp 20   Ht 4\' 11"  (1.499 m)   Wt 46 kg   SpO2 100%   BMI 20.48 kg/m   Physical examination:  General:  Average built, not in obvious distress, on nasal cannula oxygen, chronically ill, resting comfortably HENT:   No scleral pallor or icterus noted. Oral mucosa is moist.  Chest:    Diminished breath sounds bilaterally.  CVS: S1 &S2 heard. No murmur.  Regular rate and rhythm. Abdomen: Soft, nontender, nondistended.  Bowel sounds are heard.  PEG tube in place. Extremities: No  cyanosis, clubbing or edema.  Peripheral pulses are palpable. Psych somnolent, CNS: Moves extremities.  Contracted extremities Skin: Warm and dry.    Data Reviewed: I have personally reviewed following labs and imaging studies  CBC Lab Results  Component Value Date   WBC 12.2 (H) 10/16/2023   RBC 3.37 (L) 10/16/2023   HGB 11.1 (L) 10/16/2023   HCT 36.2 10/16/2023   MCV 107.4 (H) 10/16/2023   MCH 32.9 10/16/2023   PLT 385 10/16/2023   MCHC 30.7 10/16/2023   RDW 12.8 10/16/2023   LYMPHSABS 3,330 12/26/2022   MONOABS 0.4 05/03/2021   EOSABS 259 12/26/2022   BASOSABS 52 12/26/2022     Last metabolic panel Lab Results  Component Value Date   NA 139 10/16/2023   K 4.8 10/16/2023   CL 92 (L) 10/16/2023   CO2 37 (H) 10/16/2023   BUN 8 10/16/2023   CREATININE <0.30 (L) 10/16/2023   GLUCOSE 135 (H) 10/16/2023   GFRNONAA NOT CALCULATED 10/16/2023   GFRAA >60 08/05/2017   CALCIUM 9.4 10/16/2023   PHOS 2.8 12/06/2014   PROT 6.3 (L) 10/12/2023   ALBUMIN 2.9 (L) 10/12/2023   BILITOT 0.4 10/12/2023   ALKPHOS 38 10/12/2023   AST 17 10/12/2023   ALT 11 10/12/2023   ANIONGAP 10 10/16/2023    GFR: CrCl cannot be calculated (This lab value cannot be used to calculate CrCl because it is not a number: <0.30).  Recent Results (from the past 240 hour(s))  Resp panel by RT-PCR (RSV, Flu A&B, Covid) Anterior Nasal Swab     Status: None   Collection Time: 10/09/23  1:10 PM   Specimen: Anterior Nasal Swab  Result Value Ref Range Status   SARS Coronavirus 2 by RT PCR NEGATIVE NEGATIVE Final    Comment: (NOTE) SARS-CoV-2 target nucleic acids are NOT DETECTED.  The SARS-CoV-2 RNA is generally detectable in upper respiratory specimens during the acute phase of infection. The lowest concentration of SARS-CoV-2 viral copies this assay can detect is 138 copies/mL. A negative result does not preclude SARS-Cov-2 infection and should not be used as the sole basis for treatment or other  patient management decisions. A negative result may occur with  improper specimen collection/handling, submission of specimen other than nasopharyngeal swab, presence of viral mutation(s) within the areas targeted by this assay, and inadequate number of viral copies(<138 copies/mL). A negative result must be combined with clinical observations, patient history, and epidemiological information. The expected result is Negative.  Fact Sheet for Patients:  BloggerCourse.com  Fact Sheet for Healthcare Providers:  SeriousBroker.it  This test is no t yet approved or cleared by the [place] FDA and  has been authorized for detection and/or diagnosis of SARS-CoV-2 by FDA under an Emergency Use Authorization (EUA). This EUA will remain  in effect (meaning this test can be used) for the duration of the COVID-19 declaration under Section 564(b)(1) of the Act, 21 U.S.C.section 360bbb-3(b)(1), unless the authorization is terminated  or revoked sooner.       Influenza A by PCR NEGATIVE NEGATIVE Final   Influenza B by PCR NEGATIVE NEGATIVE Final    Comment: (NOTE) The Xpert Xpress SARS-CoV-2/FLU/RSV plus assay is intended as an aid in the diagnosis of influenza from Nasopharyngeal swab specimens and should not be  used as a sole basis for treatment. Nasal washings and aspirates are unacceptable for Xpert Xpress SARS-CoV-2/FLU/RSV testing.  Fact Sheet for Patients: BloggerCourse.com  Fact Sheet for Healthcare Providers: SeriousBroker.it  This test is not yet approved or cleared by the [place] FDA and has been authorized for detection and/or diagnosis of SARS-CoV-2 by FDA under an Emergency Use Authorization (EUA). This EUA will remain in effect (meaning this test can be used) for the duration of the COVID-19 declaration under Section 564(b)(1) of the Act, 21 U.S.C. section  360bbb-3(b)(1), unless the authorization is terminated or revoked.     Resp Syncytial Virus by PCR NEGATIVE NEGATIVE Final    Comment: (NOTE) Fact Sheet for Patients: BloggerCourse.com  Fact Sheet for Healthcare Providers: SeriousBroker.it  This test is not yet approved or cleared by the [place] FDA and has been authorized for detection and/or diagnosis of SARS-CoV-2 by FDA under an Emergency Use Authorization (EUA). This EUA will remain in effect (meaning this test can be used) for the duration of the COVID-19 declaration under Section 564(b)(1) of the Act, 21 U.S.C. section 360bbb-3(b)(1), unless the authorization is terminated or revoked.  Performed at [facility], [address] [place], [place] [address]   Blood culture (routine x 2)     Status: None   Collection Time: 10/09/23  1:20 PM   Specimen: BLOOD  Result Value Ref Range Status   Specimen Description   Final    BLOOD RIGHT ANTECUBITAL Performed at [facility], [address], [place], [place] [address]    Special Requests   Final    BOTTLES DRAWN AEROBIC AND ANAEROBIC Blood Culture results may not be optimal due to an excessive volume of blood received in culture bottles Performed at [facility], [address], [place], [place] [address]    Culture   Final    NO GROWTH 5 DAYS Performed at [facility], [address] [place], [place] [address]    Report Status 10/14/2023 FINAL  Final  Blood culture (routine x 2)     Status: None   Collection Time: 10/09/23  1:30 PM   Specimen: BLOOD  Result Value Ref Range Status   Specimen Description   Final    BLOOD LEFT ANTECUBITAL Performed at [facility], [address] [place], [place] [address]    Special Requests   Final    BOTTLES DRAWN AEROBIC AND ANAEROBIC Blood Culture adequate volume Performed at [facility], [address], [place], [place] [address]    Culture   Final    NO GROWTH 5 DAYS Performed at [facility], [address], [place], [place] [address]    Report Status 10/14/2023 FINAL  Final  Respiratory (~20 pathogens) panel by PCR     Status: Abnormal   Collection Time: 10/12/23  6:36 PM   Specimen: Nasopharyngeal Swab; Respiratory  Result Value Ref Range Status   Adenovirus NOT DETECTED NOT DETECTED Final   Coronavirus 229E NOT DETECTED NOT DETECTED Final    Comment: (NOTE) The Coronavirus on the Respiratory Panel, DOES NOT test for the novel  Coronavirus (2019 nCoV)    Coronavirus HKU1 NOT DETECTED NOT DETECTED Final   Coronavirus NL63 NOT DETECTED NOT DETECTED Final   Coronavirus OC43 NOT DETECTED NOT DETECTED Final   Metapneumovirus NOT DETECTED NOT DETECTED Final   Rhinovirus / Enterovirus DETECTED (A) NOT DETECTED Final   Influenza A NOT DETECTED  NOT DETECTED Final   Influenza B NOT DETECTED NOT DETECTED Final   Parainfluenza Virus 1 NOT DETECTED NOT DETECTED Final   Parainfluenza Virus 2 NOT DETECTED NOT DETECTED Final   Parainfluenza Virus 3 NOT DETECTED NOT DETECTED Final   Parainfluenza Virus 4 NOT DETECTED NOT DETECTED Final   Respiratory Syncytial Virus NOT DETECTED NOT DETECTED Final   Bordetella pertussis NOT DETECTED NOT DETECTED Final   Bordetella Parapertussis NOT DETECTED NOT DETECTED Final   Chlamydophila pneumoniae NOT DETECTED NOT DETECTED Final   Mycoplasma pneumoniae NOT DETECTED NOT DETECTED Final    Comment: Performed at [facility], [address], [place], [place] [address]  MRSA Next Gen by PCR, Nasal     Status: None   Collection Time: 10/13/23  1:09 AM   Specimen: Nasal Mucosa; Nasal Swab  Result Value Ref Range Status   MRSA by PCR Next Gen NOT DETECTED NOT DETECTED Final    Comment: (NOTE) The GeneXpert MRSA Assay (FDA approved for NASAL specimens only), is one component of a comprehensive MRSA colonization  surveillance program. It is not intended to diagnose MRSA infection nor to guide or monitor treatment for MRSA infections. Test performance is not FDA approved in patients less than 32 years old. Performed at [facility], [address] [place], [place] [address]       Radiology Studies: [facility] Chest Port 1 View  Result Date: 10/16/2023 CLINICAL DATA:  [address] with respiratory failure. EXAM: PORTABLE CHEST 1 VIEW COMPARISON:  Portable chest yesterday at 7:27 a.m. FINDINGS: 4:45 a.m. There is continued opacification of the left hemithorax with ipsilateral mediastinal shift, probably due to atelectasis with underlying pleural effusion not excluded. There is a low volume of the right lung. There is right perihilar opacity which could be atelectasis or a small pneumonia. No other focal airspace process is seen on the right. The cardiomediastinal silhouette is obscured. No vascular congestion is seen on the right. No new osseous findings. Thoracic spinal fusion hardware is again shown. IMPRESSION: 1. Continued opacification of the left hemithorax with ipsilateral mediastinal shift, probably due to atelectasis with underlying pleural effusion not excluded. 2. Low volume of the right lung with right perihilar opacity which could be atelectasis or a small pneumonia. Electronically Signed   By: Almira Bar M.D.   On: 10/16/2023 05:32   DG CHEST PORT 1 VIEW  Result Date: 10/15/2023 CLINICAL DATA:  27 year old female history of atelectasis of the left lung. EXAM: PORTABLE CHEST 1 VIEW COMPARISON:  Chest x-ray 10/14/2023. FINDINGS: Complete opacification of the left hemithorax. Worsening aeration throughout the right lung. No definite right pleural effusion. No pneumothorax. No evidence of pulmonary edema. Cardiac silhouette is completely obscured. Orthopedic fixation hardware noted throughout the visualized thoracolumbar spine. IMPRESSION: 1. Worsening aeration in the right lung, favored to  reflect areas of atelectasis given the low lung volumes, although developing airspace consolidation is not entirely excluded. 2. Complete opacification of the left hemithorax redemonstrated, which may reflect atelectasis and/or consolidation, with superimposed large left pleural effusion. Electronically Signed   By: Trudie Reed M.D.   On: 10/15/2023 10:21      LOS: 7 days    Loistine Chance, MD [facility] 10/16/2023, 9:13 AM If 7PM-7AM, please contact night-coverage [email or web address]

## 2023-10-16 NOTE — Op Note (Signed)
[facility] Cardiopulmonary Patient Name: Jo Matthews Procedure Date: 10/16/2023 MRN: [id number] Attending MD: Tomma Lightning MD, MD, [id number] Date of Birth: [birth year] CSN: [id number] Age: 27 Admit Type: Inpatient Ethnicity: Not Hispanic or Latino Procedure:             Bronchoscopy Indications:           Atelectasis of the left upper lobe, Atelectasis of the                         left lower lobe Providers:             Atalie Oros A. Wynona Neat MD, MD, Lorenza Evangelist, RN, Harrington Challenger, Technician Referring MD:           Medicines:             See the Anesthesia note for documentation of the                         administered medications Complications:         No immediate complications Estimated Blood Loss:  Estimated blood loss: none. Procedure:      Pre-Anesthesia Assessment:      - The anesthesia plan was to use general anesthesia.      After obtaining informed consent, the bronchoscope was passed under       direct vision. Throughout the procedure, the patient's blood pressure,       pulse, and oxygen saturations were monitored continuously. the [id number]       ([id number]) Olympus bronoscope was introduced through the mouth, via the       endotracheal tube (the patient was intubated for the procedure) and       advanced to the tracheobronchial tree of both lungs. The procedure was       accomplished without difficulty. The patient tolerated the procedure       well. Findings:      Erythema was found in the left mainstem bronchus. thick mucus in left       mainstem suctioned easily.no definite plug noted      Washings were obtained in the left mainstem bronchus and sent for       routine cytology and bacterial, AFB and fungal analysis. The return was       cloudy. Impression:      - Atelectasis of the left upper lobe      - Atelectasis of the left lower lobe      - No specimens collected. Moderate Sedation:      An independent  trained observer was present and continuously monitored       the patient. Recommendation:      - Await test results. Procedure Code(s):      --- Professional ---      [phone], Bronchoscopy, rigid or flexible, including fluoroscopic guidance,       when performed; diagnostic, with cell washing, when performed (separate       procedure) Diagnosis Code(s):      --- Professional ---      J98.11, Atelectasis CPT copyright 2022 American Medical Association. All rights reserved. The codes documented in this report are preliminary and upon coder review may  be revised to meet current compliance requirements. Virl Diamond, MD Onnie Boer Renne Musca MD,  MD 10/16/2023 11:55:32 AM This report has been signed electronically. Number of Addenda: 0 Scope In: Scope Out:

## 2023-10-16 NOTE — Anesthesia Procedure Notes (Signed)
Procedure Name: Intubation Date/Time: 10/16/2023 11:37 AM  Performed by: Doran Clay, CRNAPre-anesthesia Checklist: Patient identified, Emergency Drugs available, Suction available, Patient being monitored and Timeout performed Patient Re-evaluated:Patient Re-evaluated prior to induction Oxygen Delivery Method: Circle system utilized Preoxygenation: Pre-oxygenation with 100% oxygen Induction Type: IV induction Laryngoscope Size: Mac, 3 and Glidescope Grade View: Grade I Tube type: Oral Tube size: 8.0 mm Number of attempts: 1 Airway Equipment and Method: Stylet Placement Confirmation: ETT inserted through vocal cords under direct vision, positive ETCO2 and breath sounds checked- equal and bilateral Secured at: 20 cm Tube secured with: Tape Dental Injury: Teeth and Oropharynx as per pre-operative assessment

## 2023-10-16 NOTE — Anesthesia Postprocedure Evaluation (Signed)
Anesthesia Post Note  Patient: Jo Matthews  Procedure(s) Performed: VIDEO BRONCHOSCOPY WITHOUT FLUORO (Left) BRONCHIAL WASHINGS     Patient location during evaluation: Endoscopy Anesthesia Type: General Level of consciousness: awake and alert Pain management: pain level controlled Vital Signs Assessment: post-procedure vital signs reviewed and stable Respiratory status: spontaneous breathing, nonlabored ventilation, respiratory function stable and patient connected to face mask oxygen Cardiovascular status: blood pressure returned to baseline and stable Postop Assessment: no apparent nausea or vomiting Anesthetic complications: no  No notable events documented.  Last Vitals:  Vitals:   10/16/23 1240 10/16/23 1250  BP: 127/70 125/71  Pulse: 100 (!) 110  Resp: 18 18  Temp:    SpO2: 100% 100%    Last Pain:  Vitals:   10/16/23 1250  TempSrc:   PainSc: 0-No pain                 Quinley Nesler,W. EDMOND

## 2023-10-16 NOTE — Anesthesia Preprocedure Evaluation (Addendum)
Anesthesia Evaluation  Patient identified by MRN, date of birth, ID band Patient awake    Reviewed: Allergy & Precautions, H&P , NPO status , Patient's Chart, lab work & pertinent test results  Airway Mallampati: III  TM Distance: >3 FB Neck ROM: Full    Dental no notable dental hx. (+) Teeth Intact, Dental Advisory Given   Pulmonary pneumonia   Pulmonary exam normal breath sounds clear to auscultation       Cardiovascular negative cardio ROS  Rhythm:Regular Rate:Normal     Neuro/Psych Seizures -, Poorly Controlled,   negative psych ROS   GI/Hepatic negative GI ROS, Neg liver ROS,,,  Endo/Other  negative endocrine ROS    Renal/GU negative Renal ROS  negative genitourinary   Musculoskeletal   Abdominal   Peds  Hematology negative hematology ROS (+)   Anesthesia Other Findings   Reproductive/Obstetrics negative OB ROS                             Anesthesia Physical Anesthesia Plan  ASA: 3  Anesthesia Plan: General   Post-op Pain Management: Minimal or no pain anticipated   Induction: Intravenous  PONV Risk Score and Plan: 3 and Ondansetron  Airway Management Planned: Oral ETT  Additional Equipment:   Intra-op Plan:   Post-operative Plan: Extubation in OR  Informed Consent: I have reviewed the patients History and Physical, chart, labs and discussed the procedure including the risks, benefits and alternatives for the proposed anesthesia with the patient or authorized representative who has indicated his/her understanding and acceptance.     Dental advisory given  Plan Discussed with: CRNA  Anesthesia Plan Comments:        Anesthesia Quick Evaluation

## 2023-10-16 NOTE — Interval H&P Note (Signed)
History and Physical Interval Note:  10/16/2023 11:22 AM  Jo Matthews  has presented today for surgery, with the diagnosis of atelectasis.  The various methods of treatment have been discussed with the patient and family. After consideration of risks, benefits and other options for treatment, the patient has consented to  Procedure(s) with comments: VIDEO BRONCHOSCOPY WITHOUT FLUORO (Left) - mucus plugging as a surgical intervention.  The patient's history has been reviewed, patient examined, no change in status, stable for surgery.  I have reviewed the patient's chart and labs.  Questions were answered to the patient's satisfaction.     Zadin Lange A Othell Diluzio

## 2023-10-17 ENCOUNTER — Inpatient Hospital Stay (HOSPITAL_COMMUNITY): Payer: Medicaid Other

## 2023-10-17 DIAGNOSIS — J9622 Acute and chronic respiratory failure with hypercapnia: Secondary | ICD-10-CM | POA: Diagnosis not present

## 2023-10-17 DIAGNOSIS — J9621 Acute and chronic respiratory failure with hypoxia: Secondary | ICD-10-CM | POA: Diagnosis not present

## 2023-10-17 DIAGNOSIS — J189 Pneumonia, unspecified organism: Secondary | ICD-10-CM | POA: Diagnosis not present

## 2023-10-17 LAB — CBC
HCT: 40.6 % (ref 36.0–46.0)
Hemoglobin: 13.1 g/dL (ref 12.0–15.0)
MCH: 33.9 pg (ref 26.0–34.0)
MCHC: 32.3 g/dL (ref 30.0–36.0)
MCV: 104.9 fL — ABNORMAL HIGH (ref 80.0–100.0)
Platelets: 384 10*3/uL (ref 150–400)
RBC: 3.87 MIL/uL (ref 3.87–5.11)
RDW: 12.9 % (ref 11.5–15.5)
WBC: 16.4 10*3/uL — ABNORMAL HIGH (ref 4.0–10.5)
nRBC: 0.1 % (ref 0.0–0.2)

## 2023-10-17 NOTE — Progress Notes (Signed)
Family member at bedside. Pt is non-verbal. VSS, on 2L O2/[place], continuous pulse ox in use. sinus tachy with HR in 100's on the tele monitor.  Call bell in reach. Safety maintained. Will continue to monitor.

## 2023-10-17 NOTE — Progress Notes (Signed)
PROGRESS NOTE    Jo Matthews  GLO:[id number] DOB: 1996/06/22 DOA: 10/09/2023 PCP: Georgiann Hahn, MD   Brief Narrative:  Jo Matthews is a 27 y.o. female with a history of Rett syndrome,  seizure disorder, chronic respiratory failure with hypoxia presented to the hospital secondary to hypoxia and found to have sepsis secondary to pneumonia with associated mucous plugging. Levaquin was started. Pulmonology was consulted and  was started on conservative treatment with aggressive pulmonary toilet with nebulizer, hypertonic saline and chest PT treatments.  Due to persistent lung opacification patient underwent bronchoscopy evaluation by pulmonary.  Assessment and Plan:  Sepsis secondary to pneumonia. Patient had leukocytosis fever with respiratory failure secondary to pneumonia.  Blood cultures negative in 5 days.  Completed Rocephin and Zithromax.  Community acquired pneumonia Rhinovirus infection. CT scan with right lower lobe pneumonia.  Was empirically on Levaquin which was discontinued due to possibility of lowering seizure threshold.  Completed Rocephin and Zithromax.   Rhinovirus detected in respiratory viral panel.    No fever  Acute on chronic respiratory failure with hypoxia Secondary to pneumonia and mucous plugging. Patient uses about 0.5 - 1L/min baseline.  Required up to 5 L on admission now 1 L/min.  Status post bronchoscopy.  Mucous plugging Status post bronchoscopy on 10/17/2023 with suctioning of thin secretions.  Chest x-ray improving at this time.  Oxygen requirement at 1 L.  Will follow serial x-rays.  Continue pulmonary toileting.  Chronic anemia Hemoglobin is stable at baseline.    Mild hypokalemia.  Resolved  Hepatomegaly Hepatic steatosis Noted on scan.  Cardiomegaly No signs of heart failure.  Continue to monitor.  Dysphagia N.p.o. at this time, continue tube feeding  Seizure disorder Rett syndrome Patient with multiple breakthrough seizures while  on home medications.  On Keppra and Depakote perampanel.   At this time neurology recommendations to continue current antiepileptics.  Continue Diazepam/Klonopin PRN for seizures no further seizures reported.   DVT prophylaxis: Lovenox subcu  Code Status:   Code Status: Full Code  Family Communication:  Spoke with the patient's mother at bedside  Disposition Plan:  Home, uncertain at this time.   Consultants: Pulmonology Neurology  Procedures:  EEG  bronchoscopy 11/29  Antimicrobials: None   Subjective: Today, patient was seen and examined at bedside.  Sleeping quietly.  Comfortable.  Patient's mother at bedside.  States that she is likely at her baseline.  Objective: BP 120/69 (BP Location: Right Arm)   Pulse (!) 116   Temp 98.5 F (36.9 C) (Axillary)   Resp 20   Ht 4\' 11"  (1.499 m)   Wt 46 kg   SpO2 93%   BMI 20.48 kg/m   Physical examination:  General:  Average built, not in obvious distress, on nasal cannula oxygen, chronically ill, resting comfortably HENT:   No scleral pallor or icterus noted. Oral mucosa is moist.  Chest:    Diminished breath sounds bilaterally.  No overt wheezes or crackles noted. CVS: S1 &S2 heard. No murmur.  Regular rate and rhythm. Abdomen: Soft, nontender, nondistended.  Bowel sounds are heard.  PEG tube in place. Extremities: No cyanosis, clubbing or edema.  Peripheral pulses are palpable. Psych somnolent in bed. CNS: Moves extremities.  Contracted extremities Skin: Warm and dry.    Data Reviewed: I have personally reviewed following labs and imaging studies  CBC Lab Results  Component Value Date   WBC 16.4 (H) 10/17/2023   RBC 3.87 10/17/2023   HGB 13.1 10/17/2023   HCT 40.6  10/17/2023   MCV 104.9 (H) 10/17/2023   MCH 33.9 10/17/2023   PLT 384 10/17/2023   MCHC 32.3 10/17/2023   RDW 12.9 10/17/2023   LYMPHSABS 3,330 12/26/2022   MONOABS 0.4 05/03/2021   EOSABS 259 12/26/2022   BASOSABS 52 12/26/2022     Last  metabolic panel Lab Results  Component Value Date   NA 139 10/16/2023   K 4.8 10/16/2023   CL 92 (L) 10/16/2023   CO2 37 (H) 10/16/2023   BUN 8 10/16/2023   CREATININE <0.30 (L) 10/16/2023   GLUCOSE 135 (H) 10/16/2023   GFRNONAA NOT CALCULATED 10/16/2023   GFRAA >60 08/05/2017   CALCIUM 9.4 10/16/2023   PHOS 2.8 12/06/2014   PROT 6.3 (L) 10/12/2023   ALBUMIN 2.9 (L) 10/12/2023   BILITOT 0.4 10/12/2023   ALKPHOS 38 10/12/2023   AST 17 10/12/2023   ALT 11 10/12/2023   ANIONGAP 10 10/16/2023    GFR: CrCl cannot be calculated (This lab value cannot be used to calculate CrCl because it is not a number: <0.30).  Recent Results (from the past 240 hour(s))  Resp panel by RT-PCR (RSV, Flu A&B, Covid) Anterior Nasal Swab     Status: None   Collection Time: 10/09/23  1:10 PM   Specimen: Anterior Nasal Swab  Result Value Ref Range Status   SARS Coronavirus 2 by RT PCR NEGATIVE NEGATIVE Final    Comment: (NOTE) SARS-CoV-2 target nucleic acids are NOT DETECTED.  The SARS-CoV-2 RNA is generally detectable in upper respiratory specimens during the acute phase of infection. The lowest concentration of SARS-CoV-2 viral copies this assay can detect is 138 copies/mL. A negative result does not preclude SARS-Cov-2 infection and should not be used as the sole basis for treatment or other patient management decisions. A negative result may occur with  improper specimen collection/handling, submission of specimen other than nasopharyngeal swab, presence of viral mutation(s) within the areas targeted by this assay, and inadequate number of viral copies(<138 copies/mL). A negative result must be combined with clinical observations, patient history, and epidemiological information. The expected result is Negative.  Fact Sheet for Patients:  BloggerCourse.com  Fact Sheet for Healthcare Providers:  SeriousBroker.it  This test is no t yet  approved or cleared by the [place] FDA and  has been authorized for detection and/or diagnosis of SARS-CoV-2 by FDA under an Emergency Use Authorization (EUA). This EUA will remain  in effect (meaning this test can be used) for the duration of the COVID-19 declaration under Section 564(b)(1) of the Act, 21 U.S.C.section 360bbb-3(b)(1), unless the authorization is terminated  or revoked sooner.       Influenza A by PCR NEGATIVE NEGATIVE Final   Influenza B by PCR NEGATIVE NEGATIVE Final    Comment: (NOTE) The Xpert Xpress SARS-CoV-2/FLU/RSV plus assay is intended as an aid in the diagnosis of influenza from Nasopharyngeal swab specimens and should not be used as a sole basis for treatment. Nasal washings and aspirates are unacceptable for Xpert Xpress SARS-CoV-2/FLU/RSV testing.  Fact Sheet for Patients: BloggerCourse.com  Fact Sheet for Healthcare Providers: SeriousBroker.it  This test is not yet approved or cleared by the [place] FDA and has been authorized for detection and/or diagnosis of SARS-CoV-2 by FDA under an Emergency Use Authorization (EUA). This EUA will remain in effect (meaning this test can be used) for the duration of the COVID-19 declaration under Section 564(b)(1) of the Act, 21 U.S.C. section 360bbb-3(b)(1), unless the authorization is terminated or revoked.  Resp Syncytial Virus by PCR NEGATIVE NEGATIVE Final    Comment: (NOTE) Fact Sheet for Patients: BloggerCourse.com  Fact Sheet for Healthcare Providers: SeriousBroker.it  This test is not yet approved or cleared by the [place] FDA and has been authorized for detection and/or diagnosis of SARS-CoV-2 by FDA under an Emergency Use Authorization (EUA). This EUA will remain in effect (meaning this test can be used) for the duration of the COVID-19 declaration under Section 564(b)(1) of  the Act, 21 U.S.C. section 360bbb-3(b)(1), unless the authorization is terminated or revoked.  Performed at [facility], [address] [place], [place] [address]   Blood culture (routine x 2)     Status: None   Collection Time: 10/09/23  1:20 PM   Specimen: BLOOD  Result Value Ref Range Status   Specimen Description   Final    BLOOD RIGHT ANTECUBITAL Performed at [facility], [address], [place], [place] [address]    Special Requests   Final    BOTTLES DRAWN AEROBIC AND ANAEROBIC Blood Culture results may not be optimal due to an excessive volume of blood received in culture bottles Performed at [facility], [address], [place], [place] [address]    Culture   Final    NO GROWTH 5 DAYS Performed at [facility], [address] [place], [place] [address]    Report Status 10/14/2023 FINAL  Final  Blood culture (routine x 2)     Status: None   Collection Time: 10/09/23  1:30 PM   Specimen: BLOOD  Result Value Ref Range Status   Specimen Description   Final    BLOOD LEFT ANTECUBITAL Performed at [facility], [address], [place], [place] [address]    Special Requests   Final    BOTTLES DRAWN AEROBIC AND ANAEROBIC Blood Culture adequate volume Performed at [facility], [address], [place], [place] [address]    Culture   Final    NO GROWTH 5 DAYS Performed at [facility], [address], [place], [place] [address]    Report Status 10/14/2023 FINAL  Final  Respiratory (~20 pathogens) panel by PCR     Status: Abnormal   Collection Time: 10/12/23  6:36 PM   Specimen: Nasopharyngeal Swab; Respiratory  Result Value Ref Range Status   Adenovirus NOT DETECTED NOT DETECTED Final   Coronavirus 229E NOT DETECTED NOT DETECTED Final    Comment: (NOTE) The Coronavirus on the Respiratory Panel, DOES NOT test for the novel  Coronavirus (2019 nCoV)     Coronavirus HKU1 NOT DETECTED NOT DETECTED Final   Coronavirus NL63 NOT DETECTED NOT DETECTED Final   Coronavirus OC43 NOT DETECTED NOT DETECTED Final   Metapneumovirus NOT DETECTED NOT DETECTED Final   Rhinovirus / Enterovirus DETECTED (A) NOT DETECTED Final   Influenza A NOT DETECTED NOT DETECTED Final   Influenza B NOT DETECTED NOT DETECTED Final   Parainfluenza Virus 1 NOT DETECTED NOT DETECTED Final   Parainfluenza Virus 2 NOT DETECTED NOT DETECTED Final   Parainfluenza Virus 3 NOT DETECTED NOT DETECTED Final   Parainfluenza Virus 4 NOT DETECTED NOT DETECTED Final   Respiratory Syncytial Virus NOT DETECTED NOT DETECTED Final   Bordetella pertussis NOT DETECTED NOT DETECTED Final   Bordetella Parapertussis NOT DETECTED NOT DETECTED Final   Chlamydophila pneumoniae NOT DETECTED NOT DETECTED Final   Mycoplasma pneumoniae NOT DETECTED NOT DETECTED Final    Comment: Performed  at [facility], [address] [place], [place] [address]  MRSA Next Gen by PCR, Nasal     Status: None   Collection Time: 10/13/23  1:09 AM   Specimen: Nasal Mucosa; Nasal Swab  Result Value Ref Range Status   MRSA by PCR Next Gen NOT DETECTED NOT DETECTED Final    Comment: (NOTE) The GeneXpert MRSA Assay (FDA approved for NASAL specimens only), is one component of a comprehensive MRSA colonization surveillance program. It is not intended to diagnose MRSA infection nor to guide or monitor treatment for MRSA infections. Test performance is not FDA approved in patients less than 52 years old. Performed at [facility], [address] [place], [place] [address]   Culture, Respiratory w Gram Stain     Status: None (Preliminary result)   Collection Time: 10/16/23 12:10 PM   Specimen: Bronchial Wash; Respiratory  Result Value Ref Range Status   Specimen Description   Final    BRONCHIAL WASHINGS Performed at [facility], [address] [place], [place] [address]     Special Requests   Final    NONE Performed at [facility], [address] [place], [place] [address]    Gram Stain   Final    FEW WBC PRESENT, PREDOMINANTLY PMN NO ORGANISMS SEEN Performed at [facility], [address] [place], [place] [address]    Culture PENDING  Incomplete   Report Status PENDING  Incomplete      Radiology Studies: DG CHEST PORT 1 VIEW  Result Date: 10/17/2023 CLINICAL DATA:  [id number] with atelectasis, follow-up. EXAM: PORTABLE CHEST 1 VIEW COMPARISON:  Portable chest yesterday at 4:45 a.m. FINDINGS: 6:34 a.m.  The left thorax was completely opacified yesterday. Today there is partial reaeration of the left upper lung field and continued opacification of the lower zone. The right lung is clear. The heart is obscured within the left thorax with continued left mediastinal shift. The mediastinal configuration is not well seen. No vascular congestion is evident. No new osseous findings with multilevel thoracic spine fusion. IMPRESSION: 1. Partial reaeration of the left upper lung field with continued opacification of the lower zone. 2. Continued left mediastinal shift.  Right lung is clear. Electronically Signed   By: Almira Bar M.D.   On: 10/17/2023 07:35   DG Chest Port 1 View  Result Date: 10/16/2023 CLINICAL DATA:  [phone] with respiratory failure. EXAM: PORTABLE CHEST 1 VIEW COMPARISON:  Portable chest yesterday at 7:27 a.m. FINDINGS: 4:45 a.m. There is continued opacification of the left hemithorax with ipsilateral mediastinal shift, probably due to atelectasis with underlying pleural effusion not excluded. There is a low volume of the right lung. There is right perihilar opacity which could be atelectasis or a small pneumonia. No other focal airspace process is seen on the right. The cardiomediastinal silhouette is obscured. No vascular congestion is seen on the right. No new osseous findings. Thoracic spinal fusion hardware is again  shown. IMPRESSION: 1. Continued opacification of the left hemithorax with ipsilateral mediastinal shift, probably due to atelectasis with underlying pleural effusion not excluded. 2. Low volume of the right lung with right perihilar opacity which could be atelectasis or a small pneumonia. Electronically Signed   By: Almira Bar M.D.   On: 10/16/2023 05:32      LOS: 8 days    Loistine Chance, MD [facility] 10/17/2023, 11:35 AM If 7PM-7AM, please contact night-coverage [email or web address]

## 2023-10-17 NOTE — Plan of Care (Signed)
  Problem: Clinical Measurements: Goal: Will remain free from infection Outcome: Progressing Goal: Respiratory complications will improve Outcome: Progressing   Problem: Nutrition: Goal: Adequate nutrition will be maintained Outcome: Progressing   Problem: Elimination: Goal: Will not experience complications related to urinary retention Outcome: Progressing   Problem: Self-Concept: Goal: Level of anxiety will decrease Outcome: Progressing

## 2023-10-17 NOTE — Progress Notes (Signed)
   NAME:  Jo Matthews, MRN:  [id number], DOB:  Apr 26, 1996, LOS: 8 ADMISSION DATE:  10/09/2023, CONSULTATION DATE:  10/09/2023 REFERRING MD:  Dr Theresia Lo, CHIEF COMPLAINT:  Mucus plugging   History of Present Illness:  Jo Matthews is a 27 y.o. female with an extensive PMH significant  for Retts Syndrome, seizure disorder, cerebral palsy, and chronic hypoxic respiratory failure on 0.5-1L [place] at baseline who presented to the ED 11/22 for worsening hypoxia, cough, SOB, and fatigue. Patient was seen by PCP who obtained a CXR that was concerning for PNA and prompted family to present to the ED. On ED arrival patient was seen with mild tachycardia with all other vitals WNL on 6L [place]  Patient was admitted per [facility] for management of CAP    CTA chest obtained and negative for PE but positive for right sisded PNA and near complete collapse of left in the setting of mucus plug. PCCM consulted for assistance in management.    Pertinent  Medical History  Retts Syndrome, seizure disorder, cerebral palsy, and chronic hypoxic respiratory failure on 0.5-1L [place] at baseline   Significant Hospital Events: Including procedures, antibiotic start and stop dates in addition to other pertinent events   11/22 Presented at PCP recommendation for management of CAP. CTA chest obtained and negative for PE but positive for right sisded PNA and near complete collapse of left in the setting of mucus plug.  11/23 Improved left upper lobe aeration 11/25 had multiple seizures, bronchoscopy had been planned but had to be canceled 11/29- Bronchoscopy- thin secretions, suctioned effectively 11/30- CXR looks improved   Antibiotics: -Levaquin x 4 days -Switch to Rocephin and azithromycin to complete 7 days of treatment- completed -Positive for rhinovirus  Interim History / Subjective:  On oxygen supplementation Comfortable Tolerated bronchoscopy well Chest x-ray shows some improvement  Objective   Blood pressure (!) 114/56, pulse  92, temperature 98.4 F (36.9 C), temperature source Axillary, resp. rate 19, height 4\' 11"  (1.499 m), weight 46 kg, SpO2 98%.    FiO2 (%):  [24 %] 24 %   Intake/Output Summary (Last 24 hours) at 10/17/2023 0751 Last data filed at 10/16/2023 1157 Gross per 24 hour  Intake 300 ml  Output --  Net 300 ml   Filed Weights   10/09/23 1217 10/09/23 1438 10/16/23 1113  Weight: 45.8 kg 46 kg 46 kg    Examination: General: Chronically ill-appearing, does not appear to be in distress HENT: Nasal cannula in place Lungs: Decreased air movement bilaterally, no rales Cardiovascular: S1-S2 appreciated Abdomen: Soft, bowel sounds appreciated Extremities: no edema Neuro: Arouses easily GU:   Chest x-ray today shows improvement in air entry left upper lobe  Resolved Hospital Problem list     Assessment & Plan:   Acute on chronic hypoxic and hypercapnic respiratory failure Left lung atelectasis -Continue oxygen segmentation -Continue pulmonary toileting -Chest x-ray shows some improvement  Maintaining aggressive pulmonary toileting is appropriate at present -Patient has completed course of antibiotics Lung washings not showing any organisms so far  Currently acquired pneumonia -Completed 7 days of antibiotics  History of seizures -Appears to be at baseline -Did not have any seizures in the last 24 hours -Maintain current antiseizure medications  Updated patient's mother at bedside  Can start making arrangements for possible discharge    Best Practice (right click and "Reselect all SmartList Selections" daily)   Per primary  Virl Diamond, MD [place] PCCM Pager: See Loretha Stapler

## 2023-10-18 ENCOUNTER — Inpatient Hospital Stay (HOSPITAL_COMMUNITY): Payer: Medicaid Other

## 2023-10-18 ENCOUNTER — Encounter (HOSPITAL_COMMUNITY): Payer: Self-pay | Admitting: Pulmonary Disease

## 2023-10-18 DIAGNOSIS — J9621 Acute and chronic respiratory failure with hypoxia: Secondary | ICD-10-CM | POA: Diagnosis not present

## 2023-10-18 DIAGNOSIS — G808 Other cerebral palsy: Secondary | ICD-10-CM | POA: Diagnosis not present

## 2023-10-18 DIAGNOSIS — J189 Pneumonia, unspecified organism: Secondary | ICD-10-CM | POA: Diagnosis not present

## 2023-10-18 DIAGNOSIS — F842 Rett's syndrome: Secondary | ICD-10-CM | POA: Diagnosis not present

## 2023-10-18 DIAGNOSIS — J9622 Acute and chronic respiratory failure with hypercapnia: Secondary | ICD-10-CM | POA: Diagnosis not present

## 2023-10-18 DIAGNOSIS — R569 Unspecified convulsions: Secondary | ICD-10-CM | POA: Diagnosis not present

## 2023-10-18 LAB — BASIC METABOLIC PANEL
Anion gap: 7 (ref 5–15)
BUN: 11 mg/dL (ref 6–20)
CO2: 37 mmol/L — ABNORMAL HIGH (ref 22–32)
Calcium: 9.2 mg/dL (ref 8.9–10.3)
Chloride: 91 mmol/L — ABNORMAL LOW (ref 98–111)
Creatinine, Ser: <0.3 mg/dL — ABNORMAL LOW (ref 0.44–1.00)
Glucose, Bld: 119 mg/dL — ABNORMAL HIGH (ref 70–99)
Potassium: 4.7 mmol/L (ref 3.5–5.1)
Sodium: 135 mmol/L (ref 135–145)

## 2023-10-18 LAB — CBC
HCT: 31.3 % — ABNORMAL LOW (ref 36.0–46.0)
Hemoglobin: 9.7 g/dL — ABNORMAL LOW (ref 12.0–15.0)
MCH: 33.6 pg (ref 26.0–34.0)
MCHC: 31 g/dL (ref 30.0–36.0)
MCV: 108.3 fL — ABNORMAL HIGH (ref 80.0–100.0)
Platelets: 426 10*3/uL — ABNORMAL HIGH (ref 150–400)
RBC: 2.89 MIL/uL — ABNORMAL LOW (ref 3.87–5.11)
RDW: 13.1 % (ref 11.5–15.5)
WBC: 10.7 10*3/uL — ABNORMAL HIGH (ref 4.0–10.5)
nRBC: 0.5 % — ABNORMAL HIGH (ref 0.0–0.2)

## 2023-10-18 MED ORDER — LEVALBUTEROL HCL 0.63 MG/3ML IN NEBU
0.6300 mg | INHALATION_SOLUTION | Freq: Four times a day (QID) | RESPIRATORY_TRACT | Status: DC
  Administered 2023-10-18: 0.63 mg via RESPIRATORY_TRACT
  Filled 2023-10-18: qty 3

## 2023-10-18 MED ORDER — ALBUTEROL SULFATE (2.5 MG/3ML) 0.083% IN NEBU: 2.5000 mg | INHALATION_SOLUTION | Freq: Four times a day (QID) | RESPIRATORY_TRACT | 1 refills | Status: DC | PRN

## 2023-10-18 MED ORDER — IPRATROPIUM BROMIDE 0.02 % IN SOLN
0.5000 mg | Freq: Four times a day (QID) | RESPIRATORY_TRACT | Status: DC
  Administered 2023-10-18: 0.5 mg via RESPIRATORY_TRACT
  Filled 2023-10-18: qty 2.5

## 2023-10-18 MED ORDER — SODIUM CHLORIDE 3 % IN NEBU: INHALATION_SOLUTION | RESPIRATORY_TRACT | 1 refills | Status: DC | PRN

## 2023-10-18 NOTE — Progress Notes (Addendum)
   10/18/23 0539  Assess: MEWS Score  Temp 98.1 F (36.7 C)  BP 123/80  MAP (mmHg) 93  Pulse Rate 100  ECG Heart Rate (!) 103  Resp (!) 28  SpO2 98 %  O2 Device Nasal Cannula  O2 Flow Rate (L/min) 2 L/min  Assess: MEWS Score  MEWS Temp 0  MEWS Systolic 0  MEWS Pulse 1  MEWS RR 2  MEWS LOC 0  MEWS Score 3  MEWS Score Color Yellow  Assess: if the MEWS score is Yellow or Red  Were vital signs accurate and taken at a resting state? No, vital signs rechecked (pt is restless)  Does the patient meet 2 or more of the SIRS criteria? Yes  Does the patient have a confirmed or suspected source of infection? No  MEWS guidelines implemented  Yes, yellow  Treat  MEWS Interventions Considered administering scheduled or prn medications/treatments as ordered  Take Vital Signs  Increase Vital Sign Frequency  Yellow: Q2hr x1, continue Q4hrs until patient remains green for 12hrs  Escalate  MEWS: Escalate Yellow: Discuss with charge nurse and consider notifying provider and/or RRT  Notify: Charge Nurse/RN  Name of Charge Nurse/RN Notified Madilyn Fireman, RN  Provider Notification  Provider Name/Title Anthoney Harada, NP  Date Provider Notified 10/18/23  Time Provider Notified [phone]  Method of Notification Page (secure chat)  Notification Reason Other (Comment) (RR 28, pt restless. vitals is YELLOW MEWs and MEWS protocol implemented and RRT notified. nex treatment given)  Provider response No new orders  Date of Provider Response 10/18/23  Time of Provider Response 0617  Assess: SIRS CRITERIA  SIRS Temperature  0  SIRS Pulse 1  SIRS Respirations  1  SIRS WBC 0  SIRS Score Sum  2     Pt restless and on 2L O2/[place]. HR in 100's. No respiratory distress noted. Yellow MEWS protocol implemented.

## 2023-10-18 NOTE — Plan of Care (Signed)
VSS, continues on 2L O2/[place]. Dad at bedside. Meds given as ordered. Droplet precautions maintained.  Safety maintained. Will continue to monitor.    Problem: Education: Goal: Knowledge of General Education information will improve Description: Including pain rating scale, medication(s)/side effects and non-pharmacologic comfort measures Outcome: Progressing   Problem: Health Behavior/Discharge Planning: Goal: Ability to manage health-related needs will improve Outcome: Progressing   Problem: Clinical Measurements: Goal: Ability to maintain clinical measurements within normal limits will improve Outcome: Progressing Goal: Will remain free from infection Outcome: Progressing Goal: Diagnostic test results will improve Outcome: Progressing Goal: Respiratory complications will improve Outcome: Progressing Goal: Cardiovascular complication will be avoided Outcome: Progressing   Problem: Activity: Goal: Risk for activity intolerance will decrease Outcome: Progressing   Problem: Nutrition: Goal: Adequate nutrition will be maintained Outcome: Progressing   Problem: Coping: Goal: Level of anxiety will decrease Outcome: Progressing   Problem: Elimination: Goal: Will not experience complications related to bowel motility Outcome: Progressing Goal: Will not experience complications related to urinary retention Outcome: Progressing   Problem: Pain Management: Goal: General experience of comfort will improve Outcome: Progressing   Problem: Safety: Goal: Ability to remain free from injury will improve Outcome: Progressing   Problem: Skin Integrity: Goal: Risk for impaired skin integrity will decrease Outcome: Progressing   Problem: Education: Goal: Expressions of having a comfortable level of knowledge regarding the disease process will increase Outcome: Progressing   Problem: Coping: Goal: Ability to adjust to condition or change in health will improve Outcome:  Progressing Goal: Ability to identify appropriate support needs will improve Outcome: Progressing   Problem: Health Behavior/Discharge Planning: Goal: Compliance with prescribed medication regimen will improve Outcome: Progressing   Problem: Medication: Goal: Risk for medication side effects will decrease Outcome: Progressing   Problem: Clinical Measurements: Goal: Complications related to the disease process, condition or treatment will be avoided or minimized Outcome: Progressing Goal: Diagnostic test results will improve Outcome: Progressing   Problem: Safety: Goal: Verbalization of understanding the information provided will improve Outcome: Progressing   Problem: Self-Concept: Goal: Level of anxiety will decrease Outcome: Progressing Goal: Ability to verbalize feelings about condition will improve Outcome: Progressing

## 2023-10-18 NOTE — Progress Notes (Signed)
   NAME:  Jo Matthews, MRN:  [id number], DOB:  01-14-1996, LOS: 9 ADMISSION DATE:  10/09/2023, CONSULTATION DATE:  10/09/2023 REFERRING MD:  Dr Theresia Lo, CHIEF COMPLAINT:  Mucus plugging   History of Present Illness:  Jo Matthews is a 27 y.o. female with an extensive PMH significant  for Retts Syndrome, seizure disorder, cerebral palsy, and chronic hypoxic respiratory failure on 0.5-1L [place] at baseline who presented to the ED 11/22 for worsening hypoxia, cough, SOB, and fatigue. Patient was seen by PCP who obtained a CXR that was concerning for PNA and prompted family to present to the ED. On ED arrival patient was seen with mild tachycardia with all other vitals WNL on 6L [place]  Patient was admitted per [facility] for management of CAP    CTA chest obtained and negative for PE but positive for right sisded PNA and near complete collapse of left in the setting of mucus plug. PCCM consulted for assistance in management.    Pertinent  Medical History  Retts Syndrome, seizure disorder, cerebral palsy, and chronic hypoxic respiratory failure on 0.5-1L [place] at baseline   Significant Hospital Events: Including procedures, antibiotic start and stop dates in addition to other pertinent events   11/22 Presented at PCP recommendation for management of CAP. CTA chest obtained and negative for PE but positive for right sisded PNA and near complete collapse of left in the setting of mucus plug.  11/23 Improved left upper lobe aeration 11/25 had multiple seizures, bronchoscopy had been planned but had to be canceled 11/29- Bronchoscopy- thin secretions, suctioned effectively 11/30- CXR looks improved  Antibiotics: -Levaquin x 4 days -Switch to Rocephin and azithromycin to complete 7 days of treatment- completed -Positive for rhinovirus  Interim History / Subjective:  On oxygen supplementation Appears comfortable  Objective   Blood pressure 118/61, pulse 97, temperature 97.6 F (36.4 C), temperature source Oral,  resp. rate 18, height 4\' 11"  (1.499 m), weight 46 kg, SpO2 100%.    FiO2 (%):  [24 %] 24 %  No intake or output data in the 24 hours ending 10/18/23 1025  Filed Weights   10/09/23 1217 10/09/23 1438 10/16/23 1113  Weight: 45.8 kg 46 kg 46 kg    Examination: General: Young lady, does not appear to be in distress  HENT: Nasal cannula in place Lungs: Decreased air movement bilaterally Cardiovascular: S1-S2 appreciated Abdomen: Soft  Chest x-ray 11/30 does show improvement  Resolved Hospital Problem list     Assessment & Plan:   Acute on chronic hypoxic and hypercapnic respiratory failure Left lung atelectasis -S/p bronchoscopy with improvement in atelectasis -On pulmonary toileting -Chest x-ray prior to discharge to ascertain lung is staying open  Atelectasis related to recent respiratory infection -Can consider nebulization unit at home with use of hypertonic saline and albuterol as needed  Albuterol 2.5 mg nebulized 4 times daily as needed -Hypertonic saline 3 cc nebulized twice a day as needed -Only need to use if cough and congestion  Continue oxygen supplementation  History of seizures -Appears to be back to baseline  May be considered for discharge when appropriate  Pulmonary will sign off  Virl Diamond, MD [place] [facility] Pager: See Loretha Stapler

## 2023-10-18 NOTE — Progress Notes (Signed)
Xopenex and Atrovent not done due to pt got Duoneb at 06:11. Pt tolerate CPT well at this time.

## 2023-10-18 NOTE — Discharge Summary (Signed)
Physician Discharge Summary  Jo Matthews HYQ:[id number] DOB: 21-Oct-1996 DOA: 10/09/2023  PCP: Georgiann Hahn, MD  Admit date: 10/09/2023 Discharge date: 10/18/2023  Admitted From: Home  Discharge disposition: home    Recommendations for Outpatient Follow-Up:   Follow up with your primary care provider in one week.  Check CBC, BMP, magnesium in the next visit including chest xray for follow up.   Discharge Diagnosis:   Active Problems:   Rett syndrome   Cerebral palsy, diplegic, infantile (HCC)   Acute on chronic respiratory failure with hypoxia and hypercapnia (HCC)    Discharge Condition: Improved.  Diet recommendation: Tube feeding  Wound care: None.  Code status: Full.   History of Present Illness:   Jo Matthews is a 27 y.o. female with a history of Rett syndrome,  seizure disorder, chronic respiratory failure with hypoxia presented to the hospital secondary to hypoxia and found to have sepsis secondary to pneumonia with associated mucous plugging. Levaquin was started. Pulmonology was consulted and  was started on conservative treatment with aggressive pulmonary toilet with nebulizer, hypertonic saline and chest PT treatments.  During hospitalization patient was seen by pulmonary due to mucous plugging.  Hospital Course:   Following conditions were addressed during hospitalization as listed below,  Sepsis secondary to pneumonia. Patient had leukocytosis fever with respiratory failure secondary to pneumonia.  Blood cultures negative in 5 days.  Completed Rocephin and Zithromax.  Currently no signs of infection.   Community acquired pneumonia Rhinovirus infection. CT scan with right lower lobe pneumonia.  Was empirically on Levaquin which was discontinued due to possibility of lowering seizure threshold.  Completed Rocephin and Zithromax.   Rhinovirus detected in respiratory viral panel.    No fever.  Currently at baseline.   Acute on chronic respiratory  failure with hypoxia Secondary to pneumonia and mucous plugging. Patient uses about 0.5 - 1L/min baseline.  Required up to 5 L on admission now 1 L/min.  Status post bronchoscopy.  Repeat chest x-ray today with increased infiltrate in the left lung.  Spoke with Dr Wynona Neat, Pulmonary about this prior to discharge.  At this time, family wishes to take the patient home.  She seems to be at baseline.  Family understands the risk of for worsening collapse and respiratory distress.  At this time patient will be prescribed hypertonic saline at home, albuterol nebulizers,.  Family stated that she did have some noninvasive ventilation at home.   Mucous plugging Status post bronchoscopy on 10/17/2023 with suctioning of thin secretions.  Chest x-ray improving at this time.  Oxygen requirement at 1 L.  Repeat chest x-ray with increasing infiltrate but patient at baseline.   Chronic anemia Hemoglobin is stable at baseline.     Mild hypokalemia.  Resolved   Hepatomegaly Hepatic steatosis Noted on scan.   Cardiomegaly No signs of heart failure.  Continue to monitor.   Dysphagia N.p.o. at this time, continue tube feeding   Seizure disorder Rett syndrome Patient with multiple breakthrough seizures while on home medications.  On Keppra and Depakote perampanel.   At this time neurology recommendations to continue current antiepileptics.  Continue Diazepam/Klonopin PRN for seizures no further seizures reported.  Disposition.  At this time, patient is stable for disposition home with outpatient PCP follow-up.  Medical Consultants:  Pulmonary  Procedures:    Bronchoscopy Subjective:   Today, patient seen and examined at bedside.  Sleeping in bed.  No respiratory distress.  Patient's father at bedside.  Discharge Exam:   Vitals:  10/18/23 0600 10/18/23 0814  BP:  118/61  Pulse:  97  Resp: 15 18  Temp:  97.6 F (36.4 C)  SpO2:  100%   Vitals:   10/18/23 0002 10/18/23 0539 10/18/23 0600  10/18/23 0814  BP: 117/67 123/80  118/61  Pulse: 99 100  97  Resp: 19 (!) 28 15 18   Temp: 98.8 F (37.1 C) 98.1 F (36.7 C)  97.6 F (36.4 C)  TempSrc: Oral Oral  Oral  SpO2: 100% 98%  100%  Weight:      Height:       Body mass index is 20.48 kg/m.   General: Alert awake,  appears chronically ill, not in distress, HENT: pupils equally reacting to light,  No scleral pallor or icterus noted. Oral mucosa is moist.  Chest:    Diminished breath sounds bilaterally. No crackles or wheezes.  CVS: S1 &S2 heard. No murmur.  Regular rate and rhythm. Abdomen: Soft, nontender, nondistended.  Bowel sounds are heard.  PEG tube in place. Extremities: No cyanosis, clubbing or edema.  Peripheral pulses are palpable. Psych: Somnolent. CNS: Contracted extremities. Skin: Warm and dry.  No rashes noted.  The results of significant diagnostics from this hospitalization (including imaging, microbiology, ancillary and laboratory) are listed below for reference.     Diagnostic Studies:   DG CHEST PORT 1 VIEW  Result Date: 10/10/2023 CLINICAL DATA:  27 year old female with increased oxygen requirement. Complete left lung atelectasis on CTA yesterday, suspected mucous plugging. EXAM: PORTABLE CHEST 1 VIEW COMPARISON:  CTA chest and radiographs yesterday. FINDINGS: Portable AP semi upright view at 0611 hours.Ongoing dense perihilar and bibasilar lung opacification. But peripheral left upper lung ventilation has improved since 1119 hours yesterday. Improved visualization of airways at the bifurcation of the left mainstem. Right lung base opacity is more veiling now. Most mediastinal contours are obscured. Small volume pleural effusion also visible in the left lung apex. Visualized tracheal air column is within normal limits. Extensive posterior spinal rod fusion hardware is stable. Paucity of bowel gas. IMPRESSION: 1. Ongoing bilateral lung opacification, but left upper lobe ventilation has improved since 1119  hours yesterday. Left mainstem bronchus patency appears improved. 2. Evidence of bilateral pleural effusions, new since yesterday. Electronically Signed   By: Odessa Fleming M.D.   On: 10/10/2023 06:33   CT Angio Chest Pulmonary Embolism (PE) W or WO Contrast  Result Date: 10/09/2023 CLINICAL DATA:  Increased oxygen requirement. EXAM: CT ANGIOGRAPHY CHEST WITH CONTRAST TECHNIQUE: Multidetector CT imaging of the chest was performed using the standard protocol during bolus administration of intravenous contrast. Multiplanar CT image reconstructions and MIPs were obtained to evaluate the vascular anatomy. RADIATION DOSE REDUCTION: This exam was performed according to the departmental dose-optimization program which includes automated exposure control, adjustment of the mA and/or kV according to patient size and/or use of iterative reconstruction technique. CONTRAST:  75mL OMNIPAQUE IOHEXOL 350 MG/ML SOLN COMPARISON:  Chest x-ray earlier today. Prior CTA of the chest on 08/05/2017 FINDINGS: Cardiovascular: The pulmonary arteries are adequately opacified. There is no evidence of pulmonary embolism. Central pulmonary arteries are not dilated. The thoracic aorta is normal in caliber. The heart is mild-to-moderately enlarged. No pericardial fluid present. No visible calcified coronary artery plaque. Mediastinum/Nodes: No enlarged mediastinal, hilar, or axillary lymph nodes. Thyroid gland, trachea, and esophagus demonstrate no significant findings. Lungs/Pleura: New complete atelectasis of the left lung with cut off of the left mainstem bronchus consistent with occlusion, likely related to mucous plug. New airspace disease of  the right lower lobe is consistent with pneumonia. No associated pleural effusions. No pneumothorax. The right upper lobe and middle lobe are normally aerated. Upper Abdomen: Hepatomegaly and hepatic steatosis. Musculoskeletal: Extensive spinal fusion hardware again noted. Review of the MIP images  confirms the above findings. IMPRESSION: 1. No evidence of pulmonary embolism. 2. New complete atelectasis of the left lung with cut off of the left mainstem bronchus consistent with occlusion, likely related to mucous plug. 3. New airspace disease of the right lower lobe is consistent with pneumonia. 4. Mild-to-moderate cardiomegaly. 5. Hepatomegaly and hepatic steatosis. Electronically Signed   By: Irish Lack M.D.   On: 10/09/2023 16:12   DG Chest 2 View  Result Date: 10/09/2023 CLINICAL DATA:  Fever and cough.  Evaluate for pneumonia. EXAM: CHEST - 2 VIEW COMPARISON:  Chest radiographs 01/20/2023, 05/20/2022, 05/01/2021 FINDINGS: As on the comparison studies, there is moderate hypoinflation. There is worsened now homogeneous opacification of the majority of the left lung. Moderate heterogeneous opacification of the inferior right lung is new from most recent 05/20/2022 and 01/20/2023 comparison radiographs. The cardiac silhouette and mediastinal contours are not well evaluated given the left lung opacities. No definite pneumothorax is seen. Mild dextrocurvature of the midthoracic spine with extensive thoracic and lumbar posterior rod and screw fusion hardware. Left upper abdominal quadrant gastrostomy tube is noted. IMPRESSION: 1. Worsened now homogeneous opacification of the majority of the left lung. This may represent a combination of pleural effusion, atelectasis, and/or pneumonia. 2. Moderate heterogeneous opacification of the inferior right lung is new from most recent 05/20/2022 and 01/20/2023 comparison radiographs. This also may represent atelectasis and/or pneumonia. Electronically Signed   By: Neita Garnet M.D.   On: 10/09/2023 11:50     Labs:   Basic Metabolic Panel: Recent Labs  Lab 10/12/23 1252 10/14/23 0302 10/15/23 0739 10/16/23 0632 10/18/23 0514  NA 141 142 135 139 135  K 3.5 3.3* 4.8 4.8 4.7  CL 93* 94* 91* 92* 91*  CO2 43* 39* 34* 37* 37*  GLUCOSE 116* 117* 101*  135* 119*  BUN <5* <5* 6 8 11   CREATININE <0.30* <0.30* <0.30* <0.30* <0.30*  CALCIUM 8.6* 8.8* 9.1 9.4 9.2  MG  --   --  2.5* 2.9*  --    GFR CrCl cannot be calculated (This lab value cannot be used to calculate CrCl because it is not a number: <0.30). Liver Function Tests: Recent Labs  Lab 10/12/23 1252  AST 17  ALT 11  ALKPHOS 38  BILITOT 0.4  PROT 6.3*  ALBUMIN 2.9*   No results for input(s): "LIPASE", "AMYLASE" in the last 168 hours. No results for input(s): "AMMONIA" in the last 168 hours. Coagulation profile No results for input(s): "INR", "PROTIME" in the last 168 hours.  CBC: Recent Labs  Lab 10/14/23 0302 10/15/23 0739 10/16/23 0632 10/17/23 0309 10/18/23 0514  WBC 11.1* 11.6* 12.2* 16.4* 10.7*  HGB 9.5* 9.9* 11.1* 13.1 9.7*  HCT 30.9* 31.2* 36.2 40.6 31.3*  MCV 108.4* 105.1* 107.4* 104.9* 108.3*  PLT 226 348 385 384 426*   Cardiac Enzymes: No results for input(s): "CKTOTAL", "CKMB", "CKMBINDEX", "TROPONINI" in the last 168 hours. BNP: Invalid input(s): "POCBNP" CBG: No results for input(s): "GLUCAP" in the last 168 hours. D-Dimer No results for input(s): "DDIMER" in the last 72 hours. Hgb A1c No results for input(s): "HGBA1C" in the last 72 hours. Lipid Profile No results for input(s): "CHOL", "HDL", "LDLCALC", "TRIG", "CHOLHDL", "LDLDIRECT" in the last 72 hours. Thyroid function  studies No results for input(s): "TSH", "T4TOTAL", "T3FREE", "THYROIDAB" in the last 72 hours.  Invalid input(s): "FREET3" Anemia work up No results for input(s): "VITAMINB12", "FOLATE", "FERRITIN", "TIBC", "IRON", "RETICCTPCT" in the last 72 hours. Microbiology Recent Results (from the past 240 hour(s))  Resp panel by RT-PCR (RSV, Flu A&B, Covid) Anterior Nasal Swab     Status: None   Collection Time: 10/09/23  1:10 PM   Specimen: Anterior Nasal Swab  Result Value Ref Range Status   SARS Coronavirus 2 by RT PCR NEGATIVE NEGATIVE Final    Comment: (NOTE) SARS-CoV-2  target nucleic acids are NOT DETECTED.  The SARS-CoV-2 RNA is generally detectable in upper respiratory specimens during the acute phase of infection. The lowest concentration of SARS-CoV-2 viral copies this assay can detect is 138 copies/mL. A negative result does not preclude SARS-Cov-2 infection and should not be used as the sole basis for treatment or other patient management decisions. A negative result may occur with  improper specimen collection/handling, submission of specimen other than nasopharyngeal swab, presence of viral mutation(s) within the areas targeted by this assay, and inadequate number of viral copies(<138 copies/mL). A negative result must be combined with clinical observations, patient history, and epidemiological information. The expected result is Negative.  Fact Sheet for Patients:  BloggerCourse.com  Fact Sheet for Healthcare Providers:  SeriousBroker.it  This test is no t yet approved or cleared by the [place] FDA and  has been authorized for detection and/or diagnosis of SARS-CoV-2 by FDA under an Emergency Use Authorization (EUA). This EUA will remain  in effect (meaning this test can be used) for the duration of the COVID-19 declaration under Section 564(b)(1) of the Act, 21 U.S.C.section 360bbb-3(b)(1), unless the authorization is terminated  or revoked sooner.       Influenza A by PCR NEGATIVE NEGATIVE Final   Influenza B by PCR NEGATIVE NEGATIVE Final    Comment: (NOTE) The Xpert Xpress SARS-CoV-2/FLU/RSV plus assay is intended as an aid in the diagnosis of influenza from Nasopharyngeal swab specimens and should not be used as a sole basis for treatment. Nasal washings and aspirates are unacceptable for Xpert Xpress SARS-CoV-2/FLU/RSV testing.  Fact Sheet for Patients: BloggerCourse.com  Fact Sheet for Healthcare  Providers: SeriousBroker.it  This test is not yet approved or cleared by the [place] FDA and has been authorized for detection and/or diagnosis of SARS-CoV-2 by FDA under an Emergency Use Authorization (EUA). This EUA will remain in effect (meaning this test can be used) for the duration of the COVID-19 declaration under Section 564(b)(1) of the Act, 21 U.S.C. section 360bbb-3(b)(1), unless the authorization is terminated or revoked.     Resp Syncytial Virus by PCR NEGATIVE NEGATIVE Final    Comment: (NOTE) Fact Sheet for Patients: BloggerCourse.com  Fact Sheet for Healthcare Providers: SeriousBroker.it  This test is not yet approved or cleared by the [place] FDA and has been authorized for detection and/or diagnosis of SARS-CoV-2 by FDA under an Emergency Use Authorization (EUA). This EUA will remain in effect (meaning this test can be used) for the duration of the COVID-19 declaration under Section 564(b)(1) of the Act, 21 U.S.C. section 360bbb-3(b)(1), unless the authorization is terminated or revoked.  Performed at [facility], [address] [place], [place] [address]   Blood culture (routine x 2)     Status: None   Collection Time: 10/09/23  1:20 PM   Specimen: BLOOD  Result Value Ref Range Status   Specimen Description  Final    BLOOD RIGHT ANTECUBITAL Performed at [facility], [address] [place], [place] [address]    Special Requests   Final    BOTTLES DRAWN AEROBIC AND ANAEROBIC Blood Culture results may not be optimal due to an excessive volume of blood received in culture bottles Performed at [facility], [address] [place], [place] [address]    Culture   Final    NO GROWTH 5 DAYS Performed at [facility], [address] [place], [place] [address]    Report Status 10/14/2023 FINAL  Final  Blood  culture (routine x 2)     Status: None   Collection Time: 10/09/23  1:30 PM   Specimen: BLOOD  Result Value Ref Range Status   Specimen Description   Final    BLOOD LEFT ANTECUBITAL Performed at [facility], [address] [place], [place] [address]    Special Requests   Final    BOTTLES DRAWN AEROBIC AND ANAEROBIC Blood Culture adequate volume Performed at [facility], [address], [place], [place] [address]    Culture   Final    NO GROWTH 5 DAYS Performed at [facility], [address], [place], [place] [address]    Report Status 10/14/2023 FINAL  Final  Respiratory (~20 pathogens) panel by PCR     Status: Abnormal   Collection Time: 10/12/23  6:36 PM   Specimen: Nasopharyngeal Swab; Respiratory  Result Value Ref Range Status   Adenovirus NOT DETECTED NOT DETECTED Final   Coronavirus 229E NOT DETECTED NOT DETECTED Final    Comment: (NOTE) The Coronavirus on the Respiratory Panel, DOES NOT test for the novel  Coronavirus (2019 nCoV)    Coronavirus HKU1 NOT DETECTED NOT DETECTED Final   Coronavirus NL63 NOT DETECTED NOT DETECTED Final   Coronavirus OC43 NOT DETECTED NOT DETECTED Final   Metapneumovirus NOT DETECTED NOT DETECTED Final   Rhinovirus / Enterovirus DETECTED (A) NOT DETECTED Final   Influenza A NOT DETECTED NOT DETECTED Final   Influenza B NOT DETECTED NOT DETECTED Final   Parainfluenza Virus 1 NOT DETECTED NOT DETECTED Final   Parainfluenza Virus 2 NOT DETECTED NOT DETECTED Final   Parainfluenza Virus 3 NOT DETECTED NOT DETECTED Final   Parainfluenza Virus 4 NOT DETECTED NOT DETECTED Final   Respiratory Syncytial Virus NOT DETECTED NOT DETECTED Final   Bordetella pertussis NOT DETECTED NOT DETECTED Final   Bordetella Parapertussis NOT DETECTED NOT DETECTED Final   Chlamydophila pneumoniae NOT DETECTED NOT DETECTED Final   Mycoplasma pneumoniae NOT DETECTED NOT DETECTED Final    Comment: Performed at [facility], [address], [place], [place] [address]  MRSA Next Gen by PCR, Nasal     Status: None   Collection Time: 10/13/23  1:09 AM   Specimen: Nasal Mucosa; Nasal Swab  Result Value Ref Range Status   MRSA by PCR Next Gen NOT DETECTED NOT DETECTED Final    Comment: (NOTE) The GeneXpert MRSA Assay (FDA approved for NASAL specimens only), is one component of a comprehensive MRSA colonization surveillance program. It is not intended to diagnose MRSA infection nor to guide or monitor treatment for MRSA infections. Test performance is not FDA approved in patients less than 70 years old. Performed at [facility], [address] [place], [place] [address]   Culture, Respiratory w Gram Stain     Status: None (Preliminary result)   Collection  Time: 10/16/23 12:10 PM   Specimen: Bronchial Wash; Respiratory  Result Value Ref Range Status   Specimen Description   Final    BRONCHIAL WASHINGS Performed at [facility], [address], [place], [place] [address]    Special Requests   Final    NONE Performed at [facility], [address], [place], [place] [address]    Gram Stain   Final    FEW WBC PRESENT, PREDOMINANTLY PMN NO ORGANISMS SEEN    Culture   Final    CULTURE REINCUBATED FOR BETTER GROWTH Performed at [facility], [address], [place], [place] [address]    Report Status PENDING  Incomplete     Discharge Instructions:   Discharge Instructions     Diet - low sodium heart healthy   Complete by: As directed    Discharge instructions   Complete by: As directed    Follow-up with your primary care provider in 1 week.  Check blood work at that time.  Continue breathing treatments, non invasive ventilation and oxygen at home.  Seek medical attention for worsening symptoms including increased work of breathing or respiratory distress.   Increase activity slowly   Complete by: As directed       Allergies  as of 10/18/2023       Reactions   Cephalexin Other (See Comments)   Seizures   Clobazam Shortness Of Breath, Other (See Comments)   Severe side effects = Respiratory distress   Penicillins Shortness Of Breath, Other (See Comments)   Respiratory distress        Medication List     STOP taking these medications    sodium chloride 0.9 % nebulizer solution Replaced by: sodium chloride HYPERTONIC 3 % nebulizer solution       TAKE these medications    albuterol (2.5 MG/3ML) 0.083% nebulizer solution Commonly known as: PROVENTIL Take 3 mLs (2.5 mg total) by nebulization every 6 (six) hours as needed for wheezing or shortness of breath.   calcium carbonate (dosed in mg elemental calcium) 1250 MG/5ML Susp Place 2 mLs (200 mg of elemental calcium total) into feeding tube 3 (three) times daily as needed for indigestion.   cetirizine HCl 5 MG/5ML Soln Commonly known as: Zyrtec TAKE 10 MLS (10 MG TOTAL) BY MOUTH DAILY What changed: See the new instructions.   clonazePAM 0.25 MG disintegrating tablet Commonly known as: KLONOPIN TAKE 1 TAB BY MOUTH IN MORNING & 2 TABS AT NIGHT WITH 15 EXTRA FOR BREAKTHROUGH SEIZURE FOR 30 DAYS What changed: See the new instructions.   diazepam 20 MG Gel Commonly known as: DIASAT Place 12.5 mg rectally as needed (for a seizure lasting more than 5 minutes).   divalproex 125 MG capsule Commonly known as: DEPAKOTE SPRINKLE 375-500 mg See admin instructions. Place 375 mg into the tube in the morning & afternoon and 500 mg at bedtime   Epidiolex 100 MG/ML solution Generic drug: cannabidiol Place 350 mg into feeding tube in the morning and at bedtime.   Fycompa 0.5 MG/ML Susp Generic drug: Perampanel Place 2 mg into feeding tube at bedtime.   ibuprofen 100 MG/5ML suspension Commonly known as: ADVIL Place 300 mg into feeding tube every 4 (four) hours as needed for mild pain.   levETIRAcetam 100 MG/ML solution Commonly known as: KEPPRA Place  2,000-2,250 mg into feeding tube See admin instructions. Place 2,000 mg (20 ml's) into the tube in the morning and 2,250 mg (22.5 ml's) at  bedtime   mupirocin ointment 2 % Commonly known as: BACTROBAN APPLY TO AFFECTED AREA 3 TIMES A DAY What changed: See the new instructions.   polyethylene glycol powder 17 GM/SCOOP powder Commonly known as: GLYCOLAX/MIRALAX Place 255 g into feeding tube daily as needed for mild constipation. What changed:  how much to take reasons to take this   ROBITUSSIN CHEST CONGESTION PO Place 10 mLs into feeding tube daily as needed (cough).   sodium chloride HYPERTONIC 3 % nebulizer solution Take by nebulization as needed for other (as needed to loosen cough, congestion,  two or three times a day). Replaces: sodium chloride 0.9 % nebulizer solution        Follow-up Information     Georgiann Hahn, MD Follow up in 1 week(s).   Specialty: Pediatrics Contact information: [address] [place] [address] [phone]                  Time coordinating discharge: 39 minutes  Signed:  Perlie Stene  [facility] 10/18/2023, 2:09 PM

## 2023-10-19 LAB — CULTURE, RESPIRATORY W GRAM STAIN: Culture: NORMAL

## 2023-10-21 ENCOUNTER — Telehealth: Payer: Self-pay | Admitting: Pediatrics

## 2023-10-21 ENCOUNTER — Other Ambulatory Visit: Payer: Self-pay | Admitting: Pediatrics

## 2023-10-21 DIAGNOSIS — J159 Unspecified bacterial pneumonia: Secondary | ICD-10-CM

## 2023-10-21 LAB — CYTOLOGY - NON PAP

## 2023-10-21 NOTE — Telephone Encounter (Signed)
Mother called stating they were recently discharged from the hospital and were told to schedule appointment with the provider. Spoke with York Cerise, office administrator, and Dr. Barney Drain, MD, and advised Mother to follow up with a pulmonologist. Spoke with [facility] and a referral is needed before patient is able to be seen. Mother requested an urgent referral be sent so the patient is able to be seen before February.

## 2023-10-22 NOTE — Telephone Encounter (Signed)
Referral forwarded to [facility]

## 2023-10-23 NOTE — Telephone Encounter (Signed)
Mother called concerning referral for pulmonology for follow up after hospitalization. Referral sent to [facility] on 10/23/2023.

## 2023-10-23 NOTE — Addendum Note (Signed)
Addended by: Aron Baba on: 10/23/2023 10:22 AM   Modules accepted: Orders

## 2023-10-23 NOTE — Telephone Encounter (Signed)
Referral sent to [facility] on 10/23/2023.

## 2023-10-27 ENCOUNTER — Encounter: Payer: Self-pay | Admitting: Pulmonary Disease

## 2023-10-27 ENCOUNTER — Ambulatory Visit: Payer: Medicaid Other

## 2023-10-27 ENCOUNTER — Ambulatory Visit: Payer: Medicaid Other | Admitting: Pulmonary Disease

## 2023-10-27 VITALS — BP 100/71 | HR 96 | Temp 96.9°F

## 2023-10-27 DIAGNOSIS — J189 Pneumonia, unspecified organism: Secondary | ICD-10-CM | POA: Diagnosis not present

## 2023-10-27 NOTE — Patient Instructions (Signed)
VISIT SUMMARY:  Jo Matthews came in for a follow-up visit after a recent hospitalization for severe pneumonia. She has a history of Rett's syndrome, cerebral palsy, and recurrent seizures, which make it difficult for her to clear secretions from her lungs. Since her discharge, she has been doing well, with her vital signs back to normal, though she still has a productive cough.  YOUR PLAN:  -RECURRENT PNEUMONIA: Recurrent pneumonia means that Jo Matthews frequently gets lung infections. This is likely due to her underlying conditions, which make it hard for her to clear mucus from her lungs. The plan is to get a chest x-ray to see if her left lung has cleared up. She should continue using albuterol and hypotonic saline nebulizers as directed. If the x-ray shows that her lung hasn't cleared, we may need to do a CT scan or another bronchoscopy.  INSTRUCTIONS:  Please follow up in 3 months or sooner if the chest x-ray shows any issues.

## 2023-10-27 NOTE — Progress Notes (Signed)
Jo Matthews    [id number]    07/07/1996  Primary Care Physician:Ramgoolam, Emeline Gins, MD  Referring Physician: Georgiann Hahn, MD [address] [place],  [place] [address]  Chief complaint: Follow-up after hospitalization for pneumonia  HPI: 27 y.o. 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   Discussed the use of AI scribe software for clinical note transcription with the patient, who gave verbal consent to proceed.  Jo Matthews, a patient with a history of Rett's syndrome, cerebral palsy, and recurrent seizures, presents for a follow-up after a recent hospitalization end of November 2024 for pneumonia. The patient's mother reports that Jo Matthews had no signs of illness until the night before her hospital admission. Jo Matthews has a history of recurrent pneumonia, occurring approximately every two to two and a half years due to difficulty swallowing secretions and aspiration.. The most recent episode was severe, with mucus plugging and collapse of the left lung. A bronchoscopy performed at the end of November revealed that the mucus was not as thick as anticipated.  Cultures with Candida in BAL and no other organisms.   Chest x-ray on Dec 1st showed near complete opacification of left hemithorax however since her discharge from the hospital on Dec 1st, Jo Matthews has been doing well, with her vital signs returning to her normal baseline. She has been coughing a bit, and her mother reports finding a large glob of mucus on her shoulder after a coughing episode. Jo Matthews has been using albuterol, which seems to be helping. She also has hypotonic saline nebulizers, which her mother has been administering in divided doses.   Outpatient Encounter Medications as of 10/27/2023  Medication Sig   albuterol (PROVENTIL)  (2.5 MG/3ML) 0.083% nebulizer solution Take 3 mLs (2.5 mg total) by nebulization every 6 (six) hours as needed for wheezing or shortness of breath.   Calcium Carbonate Antacid (CALCIUM CARBONATE, DOSED IN MG ELEMENTAL CALCIUM,) 1250 MG/5ML SUSP Place 2 mLs (200 mg of elemental calcium total) into feeding tube 3 (three) times daily as needed for indigestion.   cannabidiol (EPIDIOLEX) 100 MG/ML solution Place 350 mg into feeding tube in the morning and at bedtime.   cetirizine HCl (ZYRTEC) 5 MG/5ML SOLN TAKE 10 MLS (10 MG TOTAL) BY MOUTH DAILY (Patient taking differently: Place 10 mg into feeding tube daily.)   clonazePAM (KLONOPIN) 0.25 MG disintegrating tablet TAKE 1 TAB BY MOUTH IN MORNING & 2 TABS AT NIGHT WITH 15 EXTRA FOR BREAKTHROUGH SEIZURE FOR 30 DAYS (Patient taking differently: Take 0.25-0.5 mg by mouth See admin instructions. Dissolve 0.25 mg and place into the tube in the morning and 0.5 mg at bedtime- may give 0.25 mg once ORALLY as needed for a breakthrough seizure)   diazepam (DIASAT) 20 MG GEL Place 12.5 mg rectally as needed (for a seizure lasting more than 5 minutes).   divalproex (DEPAKOTE SPRINKLE) 125 MG capsule 375-500 mg See admin instructions. Place 375 mg into the tube in the morning & afternoon and 500 mg at bedtime   FYCOMPA 0.5 MG/ML SUSP Place 2 mg into feeding tube at bedtime.   guaiFENesin (ROBITUSSIN CHEST CONGESTION PO) Place 10 mLs into feeding tube daily as needed (cough).   ibuprofen (ADVIL) 100 MG/5ML suspension Place 300 mg into feeding tube every 4 (four) hours as needed for mild pain.  levETIRAcetam (KEPPRA) 100 MG/ML solution Place 2,000-2,250 mg into feeding tube See admin instructions. Place 2,000 mg (20 ml's) into the tube in the morning and 2,250 mg (22.5 ml's) at bedtime   mupirocin ointment (BACTROBAN) 2 % APPLY TO AFFECTED AREA 3 TIMES A DAY (Patient taking differently: Apply 1 Application topically 3 (three) times daily as needed (to affected area).)    polyethylene glycol powder (GLYCOLAX/MIRALAX) 17 GM/SCOOP powder Place 255 g into feeding tube daily as needed for mild constipation. (Patient taking differently: Place 8.5-17 g into feeding tube daily as needed for mild constipation (mix as directed).)   sodium chloride HYPERTONIC 3 % nebulizer solution Take by nebulization as needed for other (as needed to loosen cough, congestion,  two or three times a day).   [DISCONTINUED] lacosamide (VIMPAT) 50 MG TABS tablet Take by mouth. (Patient not taking: No sig reported)   No facility-administered encounter medications on file as of 10/27/2023.    Allergies as of 10/27/2023 - Review Complete 10/27/2023  Allergen Reaction Noted   Cephalexin Other (See Comments) 05/01/2021   Clobazam Shortness Of Breath and Other (See Comments) 02/23/2014   Penicillins Shortness Of Breath and Other (See Comments) 11/12/2011    Past Medical History:  Diagnosis Date   Fracture, humerus closed    G tube feedings (HCC)    Pneumonia    Recurrent fever of unknown cause 08/31/2012   Fever to 104-105 lasting 24 hrs, occuring once a week for the past 5 weeks (onset Sept 2013)   Rett's syndrome    Scoliosis    Seizure disorder (HCC)    Seizures (HCC)    Weight loss     Past Surgical History:  Procedure Laterality Date   BACK SURGERY     BRONCHIAL WASHINGS  10/16/2023   Procedure: BRONCHIAL WASHINGS;  Surgeon: Tomma Lightning, MD;  Location: [facility];  Service: Endoscopy;;   GASTROSTOMY TUBE PLACEMENT     SPINAL GROWTH RODS     SPINE SURGERY N/A    Jo Matthews 02/14/2021   VIDEO BRONCHOSCOPY Left 10/16/2023   Procedure: VIDEO BRONCHOSCOPY WITHOUT FLUORO;  Surgeon: Tomma Lightning, MD;  Location: [facility];  Service: Endoscopy;  Laterality: Left;  mucus plugging    Family History  Problem Relation Age of Onset   Heart disease Other    Hyperlipidemia Other    Diabetes Other     Social History   Socioeconomic History   Marital status: Single     Spouse name: Not on file   Number of children: Not on file   Years of education: Not on file   Highest education level: Not on file  Occupational History   Not on file  Tobacco Use   Smoking status: Never   Smokeless tobacco: Never  Substance and Sexual Activity   Alcohol use: Never   Drug use: Never   Sexual activity: Never  Other Topics Concern   Not on file  Social History Narrative   Not on file   Social Determinants of Health   Financial Resource Strain: Not on file  Food Insecurity: Patient Unable To Answer (10/10/2023)   Hunger Vital Sign    Worried About Running Out of Food in the Last Year: Patient unable to answer    Ran Out of Food in the Last Year: Patient unable to answer  Transportation Needs: No Transportation Needs (10/10/2023)   PRAPARE - Administrator, Civil Service (Medical): No    Lack of Transportation (Non-Medical): No  Physical Activity: Not on file  Stress: Not on file  Social Connections: Not on file  Intimate Partner Violence: Patient Unable To Answer (10/10/2023)   Humiliation, Afraid, Rape, and Kick questionnaire    Fear of Current or Ex-Partner: Patient unable to answer    Emotionally Abused: Patient unable to answer    Physically Abused: Patient unable to answer    Sexually Abused: Patient unable to answer    Review of systems: Review of Systems  Constitutional: Negative for fever and chills.  HENT: Negative.   Eyes: Negative for blurred vision.  Respiratory: as per HPI  Cardiovascular: Negative for chest pain and palpitations.  Gastrointestinal: Negative for vomiting, diarrhea, blood per rectum. Genitourinary: Negative for dysuria, urgency, frequency and hematuria.  Musculoskeletal: Negative for myalgias, back pain and joint pain.  Skin: Negative for itching and rash.  Neurological: Negative for dizziness, tremors, focal weakness, seizures and loss of consciousness.  Endo/Heme/Allergies: Negative for environmental  allergies.  Psychiatric/Behavioral: Negative for depression, suicidal ideas and hallucinations.  All other systems reviewed and are negative.  Physical Exam: Blood pressure 100/71, pulse 96, temperature (!) 96.9 F (36.1 C), temperature source Temporal, SpO2 98%. Gen:      No acute distress HEENT:  EOMI, sclera anicteric Neck:     No masses; no thyromegaly Lungs:    Clear to auscultation bilaterally; normal respiratory effort CV:         Regular rate and rhythm; no murmurs Abd:      + bowel sounds; soft, non-tender; no palpable masses, no distension Ext:    No edema; adequate peripheral perfusion Skin:      Warm and dry; no rash Neuro: alert and oriented x 3 Psych: normal mood and affect  Data Reviewed: Imaging: Chest x-ray 10/16/2023-opacification of left hemithorax Chest x-ray 10/17/2023-partial aeration of the left upper lobe Chest x-ray 02/16/2023-near complete opacification of breath I have reviewed and discussed  PFTs:  Labs:  Assessment and Plan Recurrent Pneumonia Recent hospitalization for severe pneumonia with left lung mucus plug and collapse. History of cerebral palsy, Rett's syndrome, and seizures contributing to difficulty with secretion clearance. Currently clinically improved with occasional productive cough. Candida in BAL is likely nonpathogenic and does not need treatment.  -Order chest x-ray to assess for resolution of left lung opacification. -Continue current regimen of albuterol and hypotonic saline nebulizers, using half of the saline tube per treatment. -Consider CT scan or repeat bronchoscopy if x-ray shows persistent opacification.  Follow-up in 3 months or sooner if chest x-ray is abnormal.   Recommendations: Chest x ray  Chilton Greathouse MD [facility] 10/27/2023, 10:49 AM  CC: Georgiann Hahn, MD

## 2023-11-18 LAB — FUNGUS CULTURE WITH STAIN

## 2023-11-18 LAB — FUNGAL ORGANISM REFLEX

## 2023-11-18 LAB — FUNGUS CULTURE RESULT

## 2024-01-25 ENCOUNTER — Telehealth: Payer: Self-pay | Admitting: Pulmonary Disease

## 2024-01-25 DIAGNOSIS — J189 Pneumonia, unspecified organism: Secondary | ICD-10-CM

## 2024-01-25 NOTE — Telephone Encounter (Signed)
 Her last chest x-ray in december shows improving pneumonia Okay to order repeat x-ray if she is having symptoms.  Please order and let mom know to bring her in. Thanks

## 2024-01-25 NOTE — Telephone Encounter (Signed)
 Called and spoke with patients mother, Norwood Levo (legal guardian).  She will bring Kimbely in this afternoon for cxr.  Will place order for cxr.  Dina verbalized understanding.

## 2024-01-25 NOTE — Telephone Encounter (Signed)
 PT's mom, Jo Matthews (DPR), states Jo Matthews  who is non-verbal, is coughing a lot and seems not to be feeling well. Her nurses at the care facility state she is congested so they put the vest on and it seemed to help a little but she still has some congestion. Mom is concerned because she was just in the hospital w/double pneumonia. Please call mom to see if Dr. Stann Mainland approve xrays to be taken. Her # is [phone] (sending to Dr. Rennis Petty the day, [name], because Mannam may be out today.)

## 2024-01-26 ENCOUNTER — Ambulatory Visit (INDEPENDENT_AMBULATORY_CARE_PROVIDER_SITE_OTHER)

## 2024-01-26 DIAGNOSIS — J189 Pneumonia, unspecified organism: Secondary | ICD-10-CM

## 2024-02-01 ENCOUNTER — Encounter: Payer: Self-pay | Admitting: Pulmonary Disease

## 2024-02-02 NOTE — Telephone Encounter (Signed)
 I called and discussed with mother on telephone Chest x-ray has not been read by radiologist but on my review it looks unchanged compared to December 16th x-ray.  If the final report is different from what we had discussed then I will call her back and inform her.  Nothing further needed

## 2024-02-15 ENCOUNTER — Other Ambulatory Visit: Payer: Self-pay | Admitting: Pediatrics

## 2024-02-23 ENCOUNTER — Telehealth: Payer: Self-pay | Admitting: Pediatrics

## 2024-02-23 NOTE — Telephone Encounter (Signed)
 Called Mother and gave instructions per the advice of Dr. Barney Drain, MD. Advised mother to continue to monitor patient and if levels drop again to go to the emergency room. Mother understood and agreed.

## 2024-02-23 NOTE — Telephone Encounter (Signed)
 Mother called requesting advice for patient. Mother states that during the night, they had great difficulty getting patient's oxygen levels above 90. Mother states the oxygen levels were in the 80's and gave the patient Albuterol treatments, saline solution, and the vest. Mother states currently the oxygen levels are 94-95 but is wondering if it is necessary to be seen for a sick visit. Mother mentioned she is currently on 2.5 liters of oxygen and used a straight oxygen tank (up to 6 liters) last night. Mother was informed Dr. Barney Drain, MD, was in patient care and would call at the earliest convenience. Mother understood and agreed.    [phone]

## 2024-03-10 ENCOUNTER — Ambulatory Visit (INDEPENDENT_AMBULATORY_CARE_PROVIDER_SITE_OTHER): Payer: Self-pay | Admitting: Pediatrics

## 2024-03-10 VITALS — Wt 100.0 lb

## 2024-03-10 DIAGNOSIS — G8250 Quadriplegia, unspecified: Secondary | ICD-10-CM

## 2024-03-10 DIAGNOSIS — Z0000 Encounter for general adult medical examination without abnormal findings: Secondary | ICD-10-CM

## 2024-03-10 DIAGNOSIS — G808 Other cerebral palsy: Secondary | ICD-10-CM

## 2024-03-10 DIAGNOSIS — F842 Rett's syndrome: Secondary | ICD-10-CM

## 2024-03-10 DIAGNOSIS — Z0001 Encounter for general adult medical examination with abnormal findings: Secondary | ICD-10-CM

## 2024-03-10 DIAGNOSIS — R569 Unspecified convulsions: Secondary | ICD-10-CM

## 2024-03-10 DIAGNOSIS — Z151 Genetic susceptibility to epilepsy and neurodevelopmental disorders: Secondary | ICD-10-CM

## 2024-03-10 DIAGNOSIS — Z68.41 Body mass index (BMI) pediatric, less than 5th percentile for age: Secondary | ICD-10-CM

## 2024-03-10 MED ORDER — MUPIROCIN 2 % EX OINT: TOPICAL_OINTMENT | CUTANEOUS | 12 refills | Status: DC

## 2024-03-10 MED ORDER — LEVOCETIRIZINE DIHYDROCHLORIDE 2.5 MG/5ML PO SOLN: 5.0000 mg | Freq: Every evening | ORAL | 12 refills | Status: AC

## 2024-03-10 MED ORDER — NYSTATIN 100000 UNIT/GM EX CREA
1.0000 | TOPICAL_CREAM | Freq: Three times a day (TID) | CUTANEOUS | 12 refills | Status: AC
Start: 2024-03-10 — End: 2024-03-24

## 2024-03-10 NOTE — Progress Notes (Signed)
 Dry cough --  Fycompa  6 mg at night Feeding not changed  Shakes  G tube doing well    History of Present Illness Main concerns today are: Routine care and follow up for Rett's syndrome and developmental delay. 2. SHOWER CHAIR needed for daily use. 3. Ventilator dependent--needs ventllator tubings and supplies 4. G tube dependent ---needs tubes and extension sets. 5. Needs a new wheelchair--ordered and in process   Developmental History Developmental delay--Rett's syndrome  Function: Mobility: manual wheelchair Pain concerns: no Hand function: Right: reduced dexterity Left: reduced dexterity Spine curvature: mild Swallowing: normal and modified diet (pureed) Toileting: dependent   Equipment:  Warden/ranger Chest vest Suction tubes and suction machine Nebulizer Pulse ox Oxygen tubes and tank Diapers Wipes and gloves G -tube button with Tubings BATH CHAIR NEEDED Nursing care 59 hours a week Non invasive vent--benefits form and using vent therapy   Oral feeds--via G tube Osmolite--1.5 cals. Bolus 240 mls and bite sized pieces of food with regular liquids.  Therapy----none   Specialists- [facility] --Loyal Ruffing  The following portions of the patient's history were reviewed and updated as appropriate: allergies, current medications, past family history, past medical history, past social history, past surgical history and problem list.  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 Gross motor: delayed 2. Fine motor/ADL: delayed 3. Educational/vocational: n/a 4. Transition skills: n/a 5. Speech/swallowing: no speech, GERD  Requires- Wheelchair--new one ordered Hoyer lift Chest vest Suction tubes and suction machine Nebulizer Pulse ox Oxygen tubes and tank Diapers Wipes and gloves G -tube button with Tubings BATH CHAIR NEEDED Nursing care 59 hours a week Non invasive vent--benefits form and using vent therapy

## 2024-03-15 ENCOUNTER — Encounter: Payer: Self-pay | Admitting: Pediatrics

## 2024-03-15 NOTE — Patient Instructions (Signed)
 Seizure, Adult A seizure is a sudden burst of abnormal activity in the brain. Seizures usually last from 30 seconds to 2 minutes. There are many types of seizures. And they can cause many different symptoms. What are the causes? Common causes of a seizure include: Fever or infection. Problems that affect the brain. These may include: A brain or head injury. A stroke. A brain tumor. Low levels of blood sugar or salt. Kidney problems or liver problems. Some inherited conditions. These are passed down from parent to child. Problems with a substance, such as: Having a reaction to a drug or a medicine. Stopping the use of a substance all of a sudden. When this causes problems, it's called withdrawal. Disorders that affect how you develop, such as autism spectrum disorder or cerebral palsy. Sometimes, the cause may not be known. Some people who have a seizure never have another one. A person who has repeated seizures over time without a clear cause has a condition called epilepsy. What increases the risk? Having a family history of epilepsy. Having had a tonic-clonic seizure before. This type of seizure causes: The muscles of the whole body to tighten, or contract. Loss of consciousness. Having a head injury or a stroke in the past. Having had too little oxygen at birth. What are the signs or symptoms? The symptoms vary depending on the type of seizure you have. Symptoms during a seizure Having convulsions. This means shaking with fast, jerky movements of muscles. Stiffness of the body. Breathing problems. Being confused. Staring or not responding to sound or touch. Head nodding, eye blinking, eye twitching, or fast eye movements. Drooling, grunting, or making clicking sounds with your mouth. Losing control of when you pee or poop. Symptoms before a seizure Feeling afraid, worried, or nervous. Feeling like you may vomit. Vertigo. This feels like: You are moving when you're  not. Things around you are moving when they're not. Dj vu. This is a feeling of having seen or heard something before. Odd tastes or smells. Changes in how you see. You may see flashing lights or spots. Symptoms after a seizure Being confused. Feeling sleepy. Headache. Sore muscles. How is this diagnosed? A seizure may be diagnosed based on: A description of your symptoms. Video of your seizures can be helpful. Your medical history. A physical exam. Tests, such as: Blood tests. CT scan. MRI. Electroencephalogram, or EEG. This test measures electrical activity in the brain. A test of your spinal fluid. This is called a spinal tap or lumbar puncture. How is this treated? If your seizure stops on its own, you will not need treatment. If your seizure lasts longer than 5 minutes, you'll normally need treatment. This may include: Medicines given through an IV. Avoiding things, such as medicines, that are known to cause your seizures. Medicines to prevent seizures. These are called antiepileptics. A device to prevent or control seizures. Eating foods that are low in carbohydrates and high in fat (ketogenic diet). Surgery. This is sometimes needed if you keep having seizures. Follow these instructions at home: Medicines Take your medicines only as told by your health care provider. Avoid anything that may keep your medicine from working, such as alcohol. Activity Follow your provider's advice about driving, swimming, and doing other things that would be dangerous if you had a seizure. Wait until your provider says it's safe for you to do these things. If you live in the [place] ask your local department of motor vehicles [facility]) when you can drive. Get  enough rest and sleep. Not getting enough sleep can make seizures more likely to happen. Teaching others  Teach friends and family what to do if you have a seizure. Tell them to: Help you get down to the ground safely. Protect your head  and body. Loosen any clothing around your neck. Turn you on your side. This helps keep your airway clear if you vomit. Know whether or not you need emergency care. Stay with you until you are better. Also, tell them what not to do if you have a seizure. Tell them: They should not hold you down. They should not put anything in your mouth. General instructions Avoid anything that has caused you to have seizures. Keep a seizure diary. Write down: What you remember about each seizure. What you think might have caused each seizure. Keep all follow-up visits. Your provider may need to monitor your progress. Contact a health care provider if: You have another seizure or seizures. Call each time you have a seizure. You have a change in how often or when you have seizures. You keep having seizures with treatment. You have symptoms of being sick or having an infection. You are not able to take your medicine. Get help right away if: You have or someone has seen you have: A seizure that lasts longer than 5 minutes. Many seizures in a row and you don't feel better between seizures. A seizure that makes it harder to breathe. A seizure that leaves you unable to speak or use a part of your body. You didn't wake up right away after a seizure. You injure yourself during a seizure. You have confusion or pain right after a seizure. These symptoms may be an emergency. Call 911 right away. Do not wait to see if the symptoms will go away. Do not drive yourself to the hospital. This information is not intended to replace advice given to you by your health care provider. Make sure you discuss any questions you have with your health care provider. Document Revised: 08/06/2023 Document Reviewed: 12/17/2022 Elsevier Patient Education  2024 [employer].

## 2024-03-29 ENCOUNTER — Telehealth: Payer: Self-pay | Admitting: Pediatrics

## 2024-03-29 MED ORDER — AZITHROMYCIN 200 MG/5ML PO SUSR: ORAL | 0 refills | Status: AC

## 2024-03-29 MED ORDER — ALBUTEROL SULFATE (2.5 MG/3ML) 0.083% IN NEBU: 2.5000 mg | INHALATION_SOLUTION | Freq: Four times a day (QID) | RESPIRATORY_TRACT | 1 refills | Status: DC | PRN

## 2024-03-29 NOTE — Telephone Encounter (Signed)
 Cough and congestion ---mom does not want to take her out in the rain for a chest x ray --will start albuterol  nebs and zithromax  and follow as needed.

## 2024-03-31 ENCOUNTER — Telehealth: Payer: Self-pay | Admitting: Pediatrics

## 2024-03-31 NOTE — Telephone Encounter (Signed)
 Mom dropped off FMLA forms to be completed at the earliest convenience. Forms were placed in Dr. Rudolpho Costa, MD, office.    Patient last seen 03/10/24

## 2024-04-12 ENCOUNTER — Encounter: Payer: Self-pay | Admitting: Pulmonary Disease

## 2024-04-12 NOTE — Telephone Encounter (Signed)
 Child medical report filled and given to front desk

## 2024-04-12 NOTE — Telephone Encounter (Signed)
 Called mom and told her FMLA forms are at the front office to be picked up.

## 2024-04-13 ENCOUNTER — Telehealth: Payer: Self-pay

## 2024-04-13 ENCOUNTER — Emergency Department (HOSPITAL_BASED_OUTPATIENT_CLINIC_OR_DEPARTMENT_OTHER)
Admission: EM | Admit: 2024-04-13 | Discharge: 2024-04-13 | Disposition: A | Discharge status: Home / Self Care | Attending: Emergency Medicine | Admitting: Emergency Medicine

## 2024-04-13 ENCOUNTER — Emergency Department (HOSPITAL_BASED_OUTPATIENT_CLINIC_OR_DEPARTMENT_OTHER)

## 2024-04-13 ENCOUNTER — Telehealth: Payer: Self-pay | Admitting: Acute Care

## 2024-04-13 ENCOUNTER — Other Ambulatory Visit: Payer: Self-pay

## 2024-04-13 DIAGNOSIS — R7981 Abnormal blood-gas level: Secondary | ICD-10-CM

## 2024-04-13 DIAGNOSIS — R942 Abnormal results of pulmonary function studies: Secondary | ICD-10-CM | POA: Diagnosis present

## 2024-04-13 MED ORDER — DOXYCYCLINE HYCLATE 100 MG PO CAPS: 100.0000 mg | ORAL_CAPSULE | Freq: Two times a day (BID) | ORAL | 0 refills | Status: DC

## 2024-04-13 MED ORDER — DOXYCYCLINE MONOHYDRATE 25 MG/5ML PO SUSR: 100.0000 mg | Freq: Two times a day (BID) | ORAL | 0 refills | Status: AC

## 2024-04-13 NOTE — ED Provider Notes (Signed)
 [place] EMERGENCY DEPARTMENT AT [facility] Provider Note   CSN: [id number] Arrival date & time: 04/13/24  1301     History  Chief Complaint  Patient presents with   Aspiration    Jo Matthews is a 28 y.o. female.  28 year old female with complex medical history--seizures/cerebral palsy--global developmental delay and non verbal, presents with mom, dad, home health nurse with concern for low 02 sats during seizures. Recent increase in dose in her seizure medications, unsure if this has caused the change vs aspiration due to history of aspiration pna vs faulty oxygen concentrator. Care takers note patient is having the same frequency and duration of seizures however during her seizures she has had a few times where her O2 sats drop in to the 50s for 3-4 minutes before improving. Seizures can last 30 minutes or so. Patient is on 1L [place] at baseline with continuous O2 plexometry. Completed z-pack on 04/02/24, took this due to rhonchi right lung base, had fever on day 1 of abx but not since. Several admission for PNA.  Patient is eating well (oral and tube feeds), normal urine and stool output.        Home Medications Prior to Admission medications   Medication Sig Start Date End Date Taking? Authorizing Provider  doxycycline (VIBRAMYCIN) 100 MG capsule Take 1 capsule (100 mg total) by mouth 2 (two) times daily. 04/13/24  Yes Darlis Eisenmenger, PA-C  albuterol  (PROVENTIL ) (2.5 MG/3ML) 0.083% nebulizer solution Take 3 mLs (2.5 mg total) by nebulization every 6 (six) hours as needed for wheezing or shortness of breath. 03/29/24   Ramgoolam, Andres, MD  Calcium  Carbonate Antacid (CALCIUM  CARBONATE, DOSED IN MG ELEMENTAL CALCIUM ,) 1250 MG/5ML SUSP Place 2 mLs (200 mg of elemental calcium  total) into feeding tube 3 (three) times daily as needed for indigestion. 05/04/21   Audria Leather, MD  cannabidiol  (EPIDIOLEX ) 100 MG/ML solution Place 350 mg into feeding tube in the morning and at  bedtime. 03/11/21   [provider]  cetirizine  HCl (ZYRTEC ) 5 MG/5ML SOLN TAKE 10 MLS (10 MG TOTAL) BY MOUTH DAILY Patient taking differently: Place 10 mg into feeding tube daily. 05/20/23   Ramgoolam, Andres, MD  clonazePAM  (KLONOPIN ) 0.25 MG disintegrating tablet TAKE 1 TAB BY MOUTH IN MORNING & 2 TABS AT NIGHT WITH 15 EXTRA FOR BREAKTHROUGH SEIZURE FOR 30 DAYS 02/15/24   Hadassah Letters, MD  diazepam  (DIASAT) 20 MG GEL Place 12.5 mg rectally as needed (for a seizure lasting more than 5 minutes). 09/18/21   [provider]  divalproex  (DEPAKOTE  SPRINKLE) 125 MG capsule 375-500 mg See admin instructions. Place 375 mg into the tube in the morning & afternoon and 500 mg at bedtime    [provider]  doxycycline (VIBRAMYCIN) 25 MG/5ML SUSR Take 20 mLs (100 mg total) by mouth 2 (two) times daily for 10 days. 04/13/24 04/23/24 Yes Darlis Eisenmenger, PA-C  FYCOMPA  0.5 MG/ML SUSP Place 2 mg into feeding tube at bedtime.    [provider]  guaiFENesin  (ROBITUSSIN CHEST CONGESTION PO) Place 10 mLs into feeding tube daily as needed (cough).    [provider]  ibuprofen  (ADVIL ) 100 MG/5ML suspension Place 300 mg into feeding tube every 4 (four) hours as needed for mild pain.    [provider]  levETIRAcetam  (KEPPRA ) 100 MG/ML solution Place 2,000-2,250 mg into feeding tube See admin instructions. Place 2,000 mg (20 ml's) into the tube in the morning and 2,250 mg (22.5 ml's) at bedtime  01/08/18   [provider]  levocetirizine (XYZAL ) 2.5 MG/5ML solution Take 10 mLs (5 mg total) by mouth every evening. 03/10/24 04/09/24  Hadassah Letters, MD  mupirocin  ointment (BACTROBAN ) 2 % APPLY TO AFFECTED AREA 3 TIMES A DAY 03/10/24   Hadassah Letters, MD  polyethylene glycol powder (GLYCOLAX /MIRALAX ) 17 GM/SCOOP powder Place 255 g into feeding tube daily as needed for mild constipation. Patient taking differently: Place 8.5-17 g into feeding tube daily as needed  for mild constipation (mix as directed). 05/04/21   Audria Leather, MD  sodium chloride  HYPERTONIC 3 % nebulizer solution Take by nebulization as needed for other (as needed to loosen cough, congestion,  two or three times a day). 10/18/23   Pokhrel, Amador Bad, MD  lacosamide  (VIMPAT ) 50 MG TABS tablet Take by mouth. Patient not taking: No sig reported 06/17/18 05/01/21  [provider]      Allergies    Cephalexin , Clobazam , and Penicillins    Review of Systems   Review of Systems Level 5 caveat for non verbal patient  Physical Exam Updated Vital Signs BP 114/73 (BP Location: Right Arm)   Pulse 90   Resp 18   SpO2 100%  Physical Exam Vitals and nursing note reviewed.  Constitutional:      General: She is not in acute distress.    Appearance: She is not diaphoretic.  HENT:     Head: Normocephalic and atraumatic.  Eyes:     Pupils: Pupils are equal, round, and reactive to light.  Cardiovascular:     Rate and Rhythm: Normal rate and regular rhythm.     Heart sounds: Normal heart sounds.  Pulmonary:     Effort: Pulmonary effort is normal.  Musculoskeletal:     Right lower leg: No edema.     Left lower leg: No edema.  Skin:    General: Skin is warm and dry.  Neurological:     Mental Status: She is alert.     Comments: Baseline neuro status   Psychiatric:     Comments: Baseline behavior      ED Results / Procedures / Treatments   Labs (all labs ordered are listed, but only abnormal results are displayed) Labs Reviewed - No data to display  EKG None  Radiology DG Chest [facility] 1 View Result Date: 04/13/2024 CLINICAL DATA:  Concern for aspiration. EXAM: PORTABLE CHEST 1 VIEW COMPARISON:  01/26/2024. FINDINGS: Low lung volumes. Similar retrocardiac opacity, not significantly changed compared to the prior exam, favored to reflect atelectasis, however, infiltrate cannot be entirely excluded. The right lung appears clear. No definite pleural effusion or pneumothorax.  Thoracolumbar fusion hardware. No acute osseous abnormality. IMPRESSION: Similar retrocardiac opacity, not significantly changed compared to the prior exam, is favored to reflect atelectasis, however, infiltrate cannot be excluded. Electronically Signed   By: Mannie Seek M.D.   On: 04/13/2024 15:33    Procedures Procedures    Medications Ordered in ED Medications - No data to display  ED Course/ Medical Decision Making/ A&P                                 Medical Decision Making Amount and/or Complexity of Data Reviewed Radiology: ordered.  Risk Prescription drug management.   This patient presents to the ED for concern of low o2 sats during seizures, this involves an extensive number of treatment options, and is a complaint that carries with it a high risk  of complications and morbidity.  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    Additional history obtained:  Additional history obtained from EMR External records from outside source obtained and reviewed including prior imaging on file   Imaging Studies ordered:  I ordered imaging studies including CXR  I independently visualized and interpreted imaging which showed abnormal left lung field although improved compared to prior on file I agree with the radiologist interpretation   Problem List / ED Course / Critical interventions / Medication management  28 year old female brought in by mom/dad/caregiver for concern for O2 sats dropping during her usual seizures as noted above. Normal sats while in the ER, parents question if there may be a problem with her concentrator as the events seem to happen with one particular piece of equipment. Patient's color noted to be normal/more pink by dad through events although mom notes her lips were  blueish. Patient's provider recommended XR to evaluate for aspiration which prompted ER visit today. Lungs CTA, patient not in distress. CXR obtained appears improved compared to prior, radiology unable to r/o pna vs atelectatsis. Shared decision make with care team, will dc with rx for abx, if pt develops fever/change in lung sounds (Assessed regularly by home health nurse), start abx and see PCP/care team.  I have reviewed the patients home medicines and have made adjustments as needed   Social Determinants of Health:  Lives with family, has specialty care team   Test / Admission - Considered:  Stable for dc with close follow up          Final Clinical Impression(s) / ED Diagnoses Final diagnoses:  Abnormal pulse oximetry    Rx / DC Orders ED Discharge Orders          Ordered    doxycycline (VIBRAMYCIN) 25 MG/5ML SUSR  2 times daily        04/13/24 1549    doxycycline (VIBRAMYCIN) 100 MG capsule  2 times daily        04/13/24 1556              Erna He 04/13/24 1600    Rosealee Concha, MD 04/13/24 2052

## 2024-04-13 NOTE — Telephone Encounter (Signed)
 Spoke with Jo Matthews pts legal guardian. Pt is having multple seizures a day, last episode was Monday 526 . Wearing 3L continuous flow. Pt legal guardian states she monitors oxygen all day and it drops in 47s when pt is having seizure. Usually takes 4-5 minutes to get O2 sats back up. Fycompa  dosage was upped to 4mg  then changed back to 2mg . Pts legal guardian thinks medication change may be the problem.    [name], this is more information to [employer].

## 2024-04-13 NOTE — ED Triage Notes (Signed)
 Sent from Pulmonologist for CXR d/t possible aspiration during seizures. Baseline 2L. Family states during seizures, patients sat drop into 50s.

## 2024-04-13 NOTE — Discharge Instructions (Signed)
 Follow up with your care team. If Jo Matthews develops a fever or adventitious lung sounds, start Doxycycline and follow up with your primary care provider. Return to the ER for worsening or concerning symptoms.

## 2024-04-13 NOTE — ED Notes (Signed)
Provider at bedside with patient and family.

## 2024-04-13 NOTE — Telephone Encounter (Signed)
 I have attempted to call the patient in response to her call about low oxygen levels during a seizure. There was no answer. The nurse who took the call advised the patient to continue wearing her oxygen to maintain oxygen to keep sats > 90%.  I called the number back, I did get an answer. I spoke with her legal guardian. She states patient has multiple seizures a day. She has recently started desaturating with the seizures to as low as 47%. It takes 4-5 minutes to recover her sats on 3 L [place].  I am concerned the patient may have aspirated , which is causing the slow recovery time. I have advised that the patient be taken to the ED for evaluation , and CXR to ensure she has not aspirated. Apparently she has been on antibiotics recently for a respiratory issue( caregiver was not sure of exact reason) . These were completed about a week ago.   We have advised ED  for CXR and  evaluation of aspiration which may be causing saturation drops into the 40's. Pt. Caregiver verbalized understanding of above and stated she would take patient to the ED.

## 2024-04-13 NOTE — Telephone Encounter (Signed)
 Patient is currently in ED

## 2024-04-14 ENCOUNTER — Telehealth: Payer: Self-pay | Admitting: Pediatrics

## 2024-04-14 NOTE — Telephone Encounter (Signed)
 Mom picked up forms in office

## 2024-04-14 NOTE — Telephone Encounter (Addendum)
 Mother came in office to pick up FMLA forms and is requesting a prescription be placed for an oxygen concentrator. Mother states they already have one, but are needing an extra one to keep at [facility] house for when the patient is with him. Mother gave the name of the person who the prescription has to be sent to and the fax number. Mother states she is unaware if the person is at a medical supplies office as Dad provided the information. Mother was made aware provider is out of office and will return 04/15/24.   Information:   Madelyne Schiff (Mother did not give a last name or office)  Fax #: [phone]

## 2024-04-18 ENCOUNTER — Telehealth: Payer: Self-pay | Admitting: Pediatrics

## 2024-04-18 NOTE — Telephone Encounter (Signed)
 Mother came in office to pick up FMLA forms and is requesting a prescription be placed for an oxygen concentrator. Mother states they already have one, but are needing an extra one to keep at [facility] house for when the patient is with him. Mother gave the name of the person who the prescription has to be sent to and the fax number. Mother states she is unaware if the person is at a medical supplies office as Dad provided the information. Mother was made aware provider is out of office and will return 04/15/24.     Information:    Madelyne Schiff (Mother did not give a last name or office)  Fax #: [phone]     Mother can be reached at [phone] [facility])

## 2024-04-19 ENCOUNTER — Telehealth: Payer: Self-pay | Admitting: Pediatrics

## 2024-04-19 DIAGNOSIS — J9621 Acute and chronic respiratory failure with hypoxia: Secondary | ICD-10-CM

## 2024-04-19 NOTE — Telephone Encounter (Signed)
 Dad called requesting an update on the previous message:    Mother came in office to pick up FMLA forms and is requesting a prescription be placed for an oxygen concentrator. Mother states they already have one, but are needing an extra one to keep at [facility] house for when the patient is with him. Mother gave the name of the person who the prescription has to be sent to and the fax number. Mother states she is unaware if the person is at a medical supplies office as Dad provided the information. Mother was made aware provider is out of office and will return 04/15/24.     Information:    Jo Matthews (Mother did not give a last name or office)  Fax #: [phone]        Mother can be reached at [phone] [facility])      Dad added the man calls himself Jo Matthews the Oxygen guy and can be reached at [phone] if further communication is needed before sending the prescription. Dad states he has no other information to provide. Dad was made aware Dr. Rudolpho Costa, MD, is out of office and will not return until tomorrow afternoon. Dad understood and agreed.

## 2024-04-20 NOTE — Telephone Encounter (Signed)
 Order sent.

## 2024-04-20 NOTE — Telephone Encounter (Signed)
 Order sent in

## 2024-04-28 ENCOUNTER — Ambulatory Visit: Admitting: Pediatrics

## 2024-06-01 ENCOUNTER — Telehealth: Payer: Self-pay | Admitting: Pediatrics

## 2024-06-01 NOTE — Telephone Encounter (Signed)
 Mother and [facility] in [place] called requesting an update on a prior authorization for clonazepam  (Klonopin ) sent 05/31/24. Mother states she is due to run out of medication on 06/02/24 and needs the prescription filled today.    Pharmacy: [facility] [place]  Mother can be best reached at [phone]

## 2024-06-03 MED ORDER — CLONAZEPAM 0.25 MG PO TBDP: ORAL_TABLET | ORAL | 5 refills | Status: AC

## 2024-06-09 ENCOUNTER — Other Ambulatory Visit: Payer: Self-pay | Admitting: Pediatrics

## 2024-06-16 ENCOUNTER — Ambulatory Visit: Admitting: Pulmonary Disease

## 2024-06-16 ENCOUNTER — Encounter: Payer: Self-pay | Admitting: Pulmonary Disease

## 2024-06-16 VITALS — BP 100/70 | HR 98 | Temp 97.1°F | Wt 100.0 lb

## 2024-06-16 DIAGNOSIS — J189 Pneumonia, unspecified organism: Secondary | ICD-10-CM

## 2024-06-16 NOTE — Patient Instructions (Addendum)
  VISIT SUMMARY: Today, you were seen for a pulmonary evaluation due to your chronic lung issues and recent changes in your seizure activity. Your recent chest x-ray showed improvement, and your current treatments seem to be effective. We discussed your ongoing management plan to ensure your respiratory health remains stable.  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 Your recent chest x-ray shows improvement, indicating that the mucus blockage is clearing up. Continue with your current treatments: chest physiotherapy twice daily and use hypertonic saline and albuterol  nebulizers as needed when you experience chest congestion or gurgling. Keep monitoring for any signs of increased chest congestion and adjust your treatment as necessary.  INSTRUCTIONS: Please continue with your current chest physiotherapy and nebulizer treatments as discussed. If you notice any increase in chest congestion or other symptoms, contact your healthcare provider for further advice.                      Contains text generated by Abridge.                                 Contains text generated by Abridge.

## 2024-06-16 NOTE — Progress Notes (Signed)
 Jo Matthews    [id number]    August 26, 1996  Primary Care Physician:Ramgoolam, Gustav, MD  Referring Physician: Darrol Gustav, MD [address] [place],  [place] [address]  Chief complaint: Follow-up after hospitalization for pneumonia  HPI: 28 y.o. 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   Discussed the use of AI scribe software for clinical note transcription with the patient, who gave verbal consent to proceed.  Jo Matthews, a patient with a history of Rett's syndrome, cerebral palsy, and recurrent seizures, presents for a follow-up after a recent hospitalization end of November 2024 for pneumonia. Jo Matthews has a history of recurrent pneumonia, occurring approximately every two to two and a half years due to difficulty swallowing secretions and aspiration.. The most recent episode was severe, with mucus plugging and collapse of the left lung. A bronchoscopy performed at the end of November 2025 revealed that the mucus was not as thick as anticipated.  Cultures with Candida in BAL which is likely nonpathogenic and does not require any treatment.   Interim history: Discussed the use of AI scribe software for clinical note transcription with the patient, who gave verbal consent to proceed.  History of Present Illness Jo Matthews is a 28 year old female with Rett syndrome, cerebral palsy, and recurrent seizures who presents for pulmonary evaluation. She is accompanied by her home health nurse.  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 - Recent increase in seizure frequency associated with difficulty maintaining oxygen saturation - Seizure medication dose was increased, resulting in worsening symptoms - After reduction of seizure medication dose, seizure control and oxygenation improved  Communication and functional status - Nonverbal - Frequent somnolence, especially during medical visits    Outpatient Encounter Medications as of 06/16/2024  Medication Sig   albuterol  (PROVENTIL ) (2.5 MG/3ML) 0.083% nebulizer solution Take 3 mLs (2.5 mg total) by nebulization every 6 (six) hours as needed for wheezing or shortness of breath.   Calcium  Carbonate Antacid (CALCIUM  CARBONATE, DOSED IN MG ELEMENTAL CALCIUM ,) 1250 MG/5ML SUSP Place 2 mLs (200 mg of elemental calcium  total) into feeding tube 3 (three) times daily as needed for indigestion.   cannabidiol  (EPIDIOLEX ) 100 MG/ML solution Place 350 mg into feeding tube in the morning and at bedtime.   cetirizine  HCl (ZYRTEC ) 5 MG/5ML SOLN TAKE 10 MLS (10 MG TOTAL) BY MOUTH DAILY   clonazePAM  (KLONOPIN ) 0.25 MG disintegrating tablet TAKE 1 TAB BY MOUTH IN MORNING & 2 TABS AT NIGHT WITH 15 EXTRA FOR BREAKTHROUGH SEIZURE FOR 30 DAYS   diazepam  (DIASAT) 20 MG GEL Place 12.5 mg rectally as needed (for a seizure lasting more than 5 minutes).   divalproex  (DEPAKOTE  SPRINKLE) 125 MG capsule 375-500 mg See admin instructions. Place 375 mg into the tube in the morning & afternoon and 500 mg at bedtime  FYCOMPA  0.5 MG/ML SUSP Place 2 mg into feeding tube at bedtime.   guaiFENesin  (ROBITUSSIN CHEST CONGESTION PO) Place 10 mLs into feeding tube daily as needed  (cough).   ibuprofen  (ADVIL ) 100 MG/5ML suspension Place 300 mg into feeding tube every 4 (four) hours as needed for mild pain.   levETIRAcetam  (KEPPRA ) 100 MG/ML solution Place 2,000-2,250 mg into feeding tube See admin instructions. Place 2,000 mg (20 ml's) into the tube in the morning and 2,250 mg (22.5 ml's) at bedtime   mupirocin  ointment (BACTROBAN ) 2 % APPLY TO AFFECTED AREA 3 TIMES A DAY   polyethylene glycol powder (GLYCOLAX /MIRALAX ) 17 GM/SCOOP powder Place 255 g into feeding tube daily as needed for mild constipation. (Patient taking differently: Place 8.5-17 g into feeding tube daily as needed for mild constipation (mix as directed).)   sodium chloride  HYPERTONIC 3 % nebulizer solution Take by nebulization as needed for other (as needed to loosen cough, congestion,  two or three times a day).   doxycycline  (VIBRAMYCIN ) 100 MG capsule Take 1 capsule (100 mg total) by mouth 2 (two) times daily. (Patient not taking: Reported on 06/16/2024)   levocetirizine (XYZAL ) 2.5 MG/5ML solution Take 10 mLs (5 mg total) by mouth every evening.   [DISCONTINUED] lacosamide  (VIMPAT ) 50 MG TABS tablet Take by mouth. (Patient not taking: No sig reported)   No facility-administered encounter medications on file as of 06/16/2024.   Vitals:   06/16/24 1147  BP: 100/70  Pulse: 98  Temp: (!) 97.1 F (36.2 C)  Weight: 100 lb (45.4 kg)  SpO2: 98%  TempSrc: Skin    Physical Exam GEN: No acute distress CV: Regular rate and rhythm no murmurs LUNGS: Clear to auscultation bilaterally normal respiratory effort SKIN JOINTS: Warm and dry no rash   Data Reviewed: Imaging: Chest x-ray 10/16/2023-opacification of left hemithorax Chest x-ray 10/17/2023-partial aeration of the left upper lobe Chest x-ray 02/16/2023-near complete opacification of left lung Chest x-ray 11/02/2023-improving left lung opacification with persistent opacities in the left mid and lower lung Chest x-ray 02/07/2024-Persistent retrocardiac  opacity consistent with atelectasis Chest x-ray 04/13/2024-stable retrocardiac opacity  I have reviewed and discussed  PFTs:  Labs: Assessment & Plan 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 Improved chest x-ray findings compared to previous imaging, indicating resolution of mucus plugging. Current management includes chest physiotherapy and nebulizer treatments. No acute changes noted, and current management appears effective. - Continue chest physiotherapy twice daily. - Use hypertonic saline and albuterol  nebulizers as needed based on symptoms of chest congestion, such as gurgling. - Monitor for signs of increased chest congestion and adjust treatment as necessary.   Recommendations: Chest PT for mucociliary clearance Hypertonic saline and albuterol  nebulizers as needed  Elmyra Banwart MD [facility] 06/16/2024, 11:58 AM  CC: Darrol Merck, MD

## 2024-07-04 ENCOUNTER — Telehealth: Payer: Self-pay | Admitting: Pediatrics

## 2024-07-04 NOTE — Telephone Encounter (Signed)
 Prior approval for clonazepam  sent

## 2024-09-09 ENCOUNTER — Telehealth: Payer: Self-pay | Admitting: Pediatrics

## 2024-09-09 NOTE — Telephone Encounter (Signed)
 Mom called in and noted patient is coughing, sounds hoarse, but doesn't hear any fluid just really dry. Concerned if fluid in lungs. Patient nurse is out for surgery and mom not sure what to do. Also it was noted oxygen is normal, but last time mom thought patient was ok she actually had double pneumonia.   Stepped in back and spoke with PCP, it was suggested mom take patient to urgent care for x-ray.   Mom acknowledged and confirmed understanding.

## 2024-09-12 NOTE — Telephone Encounter (Signed)
Patient advised to go to urgent care

## 2024-10-24 NOTE — Telephone Encounter (Signed)
 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  Continue: Clonazepam  0.25 mg in the a.m. and 0.5 mg in the p.m.  To take Additional doses of 0.25 mg in the afternoon if needed for breakthrough seizures. 2.  Continue: Depakote  125 mg sprinkles p.o. 3 sprinkles in the morning 3 in the afternoon and 4 at night. 3.  Continue: Keppra  100 mg/mL, 20 mL's in the morning and 22.5 mL's at night  4.  Continue: Cannabidiol  3.5 mL's twice daily 5.  Increasae Fycompa   from to 3 mg to be taken at night.  6.. Follow-up in4-66 months' time or sooner as needed.  cannabidioL  (Epidiolex ) refill pending Refill as you see appropriate

## 2024-10-31 ENCOUNTER — Inpatient Hospital Stay (HOSPITAL_COMMUNITY)

## 2024-10-31 ENCOUNTER — Emergency Department (HOSPITAL_COMMUNITY)

## 2024-10-31 ENCOUNTER — Other Ambulatory Visit: Payer: Self-pay

## 2024-10-31 ENCOUNTER — Inpatient Hospital Stay (HOSPITAL_COMMUNITY)
Admission: EM | Admit: 2024-10-31 | Discharge: 2024-11-17 | DRG: 870 | Disposition: E | Attending: Pulmonary Disease | Admitting: Pulmonary Disease

## 2024-10-31 ENCOUNTER — Encounter (HOSPITAL_COMMUNITY): Payer: Self-pay | Admitting: *Deleted

## 2024-10-31 DIAGNOSIS — D696 Thrombocytopenia, unspecified: Secondary | ICD-10-CM | POA: Diagnosis present

## 2024-10-31 DIAGNOSIS — Z79899 Other long term (current) drug therapy: Secondary | ICD-10-CM | POA: Diagnosis not present

## 2024-10-31 DIAGNOSIS — Z881 Allergy status to other antibiotic agents status: Secondary | ICD-10-CM

## 2024-10-31 DIAGNOSIS — G8250 Quadriplegia, unspecified: Secondary | ICD-10-CM | POA: Diagnosis present

## 2024-10-31 DIAGNOSIS — J189 Pneumonia, unspecified organism: Principal | ICD-10-CM

## 2024-10-31 DIAGNOSIS — J90 Pleural effusion, not elsewhere classified: Secondary | ICD-10-CM | POA: Diagnosis present

## 2024-10-31 DIAGNOSIS — Z7401 Bed confinement status: Secondary | ICD-10-CM

## 2024-10-31 DIAGNOSIS — R6521 Severe sepsis with septic shock: Secondary | ICD-10-CM | POA: Diagnosis present

## 2024-10-31 DIAGNOSIS — J9612 Chronic respiratory failure with hypercapnia: Secondary | ICD-10-CM | POA: Diagnosis not present

## 2024-10-31 DIAGNOSIS — J9601 Acute respiratory failure with hypoxia: Secondary | ICD-10-CM | POA: Diagnosis present

## 2024-10-31 DIAGNOSIS — Z9981 Dependence on supplemental oxygen: Secondary | ICD-10-CM

## 2024-10-31 DIAGNOSIS — N179 Acute kidney failure, unspecified: Secondary | ICD-10-CM | POA: Diagnosis not present

## 2024-10-31 DIAGNOSIS — Z792 Long term (current) use of antibiotics: Secondary | ICD-10-CM

## 2024-10-31 DIAGNOSIS — D539 Nutritional anemia, unspecified: Secondary | ICD-10-CM | POA: Diagnosis present

## 2024-10-31 DIAGNOSIS — Z515 Encounter for palliative care: Secondary | ICD-10-CM | POA: Diagnosis not present

## 2024-10-31 DIAGNOSIS — R6520 Severe sepsis without septic shock: Secondary | ICD-10-CM | POA: Diagnosis not present

## 2024-10-31 DIAGNOSIS — G40909 Epilepsy, unspecified, not intractable, without status epilepticus: Secondary | ICD-10-CM

## 2024-10-31 DIAGNOSIS — Z931 Gastrostomy status: Secondary | ICD-10-CM | POA: Diagnosis not present

## 2024-10-31 DIAGNOSIS — F842 Rett's syndrome: Secondary | ICD-10-CM | POA: Diagnosis present

## 2024-10-31 DIAGNOSIS — I952 Hypotension due to drugs: Secondary | ICD-10-CM | POA: Diagnosis present

## 2024-10-31 DIAGNOSIS — Z1152 Encounter for screening for COVID-19: Secondary | ICD-10-CM | POA: Diagnosis not present

## 2024-10-31 DIAGNOSIS — D72829 Elevated white blood cell count, unspecified: Secondary | ICD-10-CM | POA: Diagnosis not present

## 2024-10-31 DIAGNOSIS — T4275XA Adverse effect of unspecified antiepileptic and sedative-hypnotic drugs, initial encounter: Secondary | ICD-10-CM | POA: Diagnosis present

## 2024-10-31 DIAGNOSIS — M21372 Foot drop, left foot: Secondary | ICD-10-CM | POA: Diagnosis present

## 2024-10-31 DIAGNOSIS — Z833 Family history of diabetes mellitus: Secondary | ICD-10-CM

## 2024-10-31 DIAGNOSIS — J80 Acute respiratory distress syndrome: Secondary | ICD-10-CM | POA: Diagnosis present

## 2024-10-31 DIAGNOSIS — R4020 Unspecified coma: Secondary | ICD-10-CM | POA: Diagnosis present

## 2024-10-31 DIAGNOSIS — M2459 Contracture, other specified joint: Secondary | ICD-10-CM | POA: Diagnosis present

## 2024-10-31 DIAGNOSIS — J9621 Acute and chronic respiratory failure with hypoxia: Secondary | ICD-10-CM | POA: Diagnosis present

## 2024-10-31 DIAGNOSIS — Z7189 Other specified counseling: Secondary | ICD-10-CM | POA: Diagnosis not present

## 2024-10-31 DIAGNOSIS — A419 Sepsis, unspecified organism: Secondary | ICD-10-CM | POA: Diagnosis present

## 2024-10-31 DIAGNOSIS — Z888 Allergy status to other drugs, medicaments and biological substances status: Secondary | ICD-10-CM

## 2024-10-31 DIAGNOSIS — E876 Hypokalemia: Secondary | ICD-10-CM | POA: Diagnosis present

## 2024-10-31 DIAGNOSIS — Z66 Do not resuscitate: Secondary | ICD-10-CM | POA: Diagnosis not present

## 2024-10-31 DIAGNOSIS — G40409 Other generalized epilepsy and epileptic syndromes, not intractable, without status epilepticus: Secondary | ICD-10-CM | POA: Diagnosis present

## 2024-10-31 DIAGNOSIS — Z880 Allergy status to penicillin: Secondary | ICD-10-CM

## 2024-10-31 DIAGNOSIS — Z993 Dependence on wheelchair: Secondary | ICD-10-CM

## 2024-10-31 DIAGNOSIS — M419 Scoliosis, unspecified: Secondary | ICD-10-CM | POA: Diagnosis present

## 2024-10-31 DIAGNOSIS — J690 Pneumonitis due to inhalation of food and vomit: Secondary | ICD-10-CM | POA: Diagnosis present

## 2024-10-31 DIAGNOSIS — R0902 Hypoxemia: Secondary | ICD-10-CM | POA: Diagnosis not present

## 2024-10-31 DIAGNOSIS — G40919 Epilepsy, unspecified, intractable, without status epilepticus: Secondary | ICD-10-CM | POA: Diagnosis not present

## 2024-10-31 DIAGNOSIS — Z151 Genetic susceptibility to epilepsy and neurodevelopmental disorders: Secondary | ICD-10-CM | POA: Diagnosis not present

## 2024-10-31 DIAGNOSIS — Z8701 Personal history of pneumonia (recurrent): Secondary | ICD-10-CM

## 2024-10-31 DIAGNOSIS — E871 Hypo-osmolality and hyponatremia: Secondary | ICD-10-CM | POA: Diagnosis present

## 2024-10-31 DIAGNOSIS — M21371 Foot drop, right foot: Secondary | ICD-10-CM | POA: Diagnosis present

## 2024-10-31 DIAGNOSIS — R569 Unspecified convulsions: Secondary | ICD-10-CM

## 2024-10-31 LAB — RESP PANEL BY RT-PCR (RSV, FLU A&B, COVID)  RVPGX2
Influenza A by PCR: NEGATIVE
Influenza B by PCR: NEGATIVE
Resp Syncytial Virus by PCR: NEGATIVE
SARS Coronavirus 2 by RT PCR: NEGATIVE

## 2024-10-31 LAB — COMPREHENSIVE METABOLIC PANEL WITH GFR
ALT: 19 U/L (ref 0–44)
AST: 38 U/L (ref 15–41)
Albumin: 2.7 g/dL — ABNORMAL LOW (ref 3.5–5.0)
Alkaline Phosphatase: 60 U/L (ref 38–126)
Anion gap: 12 (ref 5–15)
BUN: 9 mg/dL (ref 6–20)
CO2: 36 mmol/L — ABNORMAL HIGH (ref 22–32)
Calcium: 9.4 mg/dL (ref 8.9–10.3)
Chloride: 90 mmol/L — ABNORMAL LOW (ref 98–111)
Creatinine, Ser: 0.42 mg/dL — ABNORMAL LOW (ref 0.44–1.00)
GFR, Estimated: >60 mL/min (ref >60)
Glucose, Bld: 142 mg/dL — ABNORMAL HIGH (ref 70–99)
Potassium: 3.2 mmol/L — ABNORMAL LOW (ref 3.5–5.1)
Sodium: 138 mmol/L (ref 135–145)
Total Bilirubin: 0.7 mg/dL (ref 0.0–1.2)
Total Protein: 6.6 g/dL (ref 6.5–8.1)

## 2024-10-31 LAB — BRAIN NATRIURETIC PEPTIDE: B Natriuretic Peptide: 65.5 pg/mL (ref 0.0–100.0)

## 2024-10-31 LAB — I-STAT VENOUS BLOOD GAS, ED
Acid-Base Excess: 16 mmol/L — ABNORMAL HIGH (ref 0.0–2.0)
Acid-Base Excess: 17 mmol/L — ABNORMAL HIGH (ref 0.0–2.0)
Bicarbonate: 44.2 mmol/L — ABNORMAL HIGH (ref 20.0–28.0)
Bicarbonate: 42.2 mmol/L — ABNORMAL HIGH (ref 20.0–28.0)
Calcium, Ion: 1.19 mmol/L (ref 1.15–1.40)
Calcium, Ion: 1.06 mmol/L — ABNORMAL LOW (ref 1.15–1.40)
HCT: 35 % — ABNORMAL LOW (ref 36.0–46.0)
HCT: 28 % — ABNORMAL LOW (ref 36.0–46.0)
Hemoglobin: 11.9 g/dL — ABNORMAL LOW (ref 12.0–15.0)
Hemoglobin: 9.5 g/dL — ABNORMAL LOW (ref 12.0–15.0)
O2 Saturation: 92 %
O2 Saturation: 99 %
Potassium: 2.8 mmol/L — ABNORMAL LOW (ref 3.5–5.1)
Potassium: 2.8 mmol/L — ABNORMAL LOW (ref 3.5–5.1)
Sodium: 136 mmol/L (ref 135–145)
Sodium: 135 mmol/L (ref 135–145)
TCO2: 46 mmol/L — ABNORMAL HIGH (ref 22–32)
TCO2: 44 mmol/L — ABNORMAL HIGH (ref 22–32)
pCO2, Ven: 73 mmHg (ref 44–60)
pCO2, Ven: 52.8 mmHg (ref 44–60)
pH, Ven: 7.39 (ref 7.25–7.43)
pH, Ven: 7.511 — ABNORMAL HIGH (ref 7.25–7.43)
pO2, Ven: 67 mmHg — ABNORMAL HIGH (ref 32–45)
pO2, Ven: 108 mmHg — ABNORMAL HIGH (ref 32–45)

## 2024-10-31 LAB — I-STAT CHEM 8, ED
BUN: 11 mg/dL (ref 6–20)
Calcium, Ion: 1.21 mmol/L (ref 1.15–1.40)
Chloride: 88 mmol/L — ABNORMAL LOW (ref 98–111)
Creatinine, Ser: 0.5 mg/dL (ref 0.44–1.00)
Glucose, Bld: 147 mg/dL — ABNORMAL HIGH (ref 70–99)
HCT: 37 % (ref 36.0–46.0)
Hemoglobin: 12.6 g/dL (ref 12.0–15.0)
Potassium: 2.9 mmol/L — ABNORMAL LOW (ref 3.5–5.1)
Sodium: 137 mmol/L (ref 135–145)
TCO2: 41 mmol/L — ABNORMAL HIGH (ref 22–32)

## 2024-10-31 LAB — RESPIRATORY PANEL BY PCR

## 2024-10-31 LAB — TSH: TSH: 0.625 u[IU]/mL (ref 0.350–4.500)

## 2024-10-31 LAB — POCT I-STAT 7, (LYTES, BLD GAS, ICA,H+H)
Acid-Base Excess: 10 mmol/L — ABNORMAL HIGH (ref 0.0–2.0)
Bicarbonate: 36.2 mmol/L — ABNORMAL HIGH (ref 20.0–28.0)
Calcium, Ion: 1.24 mmol/L (ref 1.15–1.40)
HCT: 25 % — ABNORMAL LOW (ref 36.0–46.0)
Hemoglobin: 8.5 g/dL — ABNORMAL LOW (ref 12.0–15.0)
O2 Saturation: 100 %
Patient temperature: 98.8
Potassium: 4.2 mmol/L (ref 3.5–5.1)
Sodium: 135 mmol/L (ref 135–145)
TCO2: 38 mmol/L — ABNORMAL HIGH (ref 22–32)
pCO2 arterial: 58.9 mmHg — ABNORMAL HIGH (ref 32–48)
pH, Arterial: 7.397 (ref 7.35–7.45)
pO2, Arterial: 185 mmHg — ABNORMAL HIGH (ref 83–108)

## 2024-10-31 LAB — GLUCOSE, CAPILLARY
Glucose-Capillary: 118 mg/dL — ABNORMAL HIGH (ref 70–99)
Glucose-Capillary: 141 mg/dL — ABNORMAL HIGH (ref 70–99)
Glucose-Capillary: 145 mg/dL — ABNORMAL HIGH (ref 70–99)
Glucose-Capillary: 148 mg/dL — ABNORMAL HIGH (ref 70–99)

## 2024-10-31 LAB — CBC WITH DIFFERENTIAL/PLATELET
Abs Immature Granulocytes: 0.02 K/uL (ref 0.00–0.07)
Basophils Absolute: 0 K/uL (ref 0.0–0.1)
Basophils Relative: 0 %
Eosinophils Absolute: 0 K/uL (ref 0.0–0.5)
Eosinophils Relative: 0 %
HCT: 36.1 % (ref 36.0–46.0)
Hemoglobin: 11.2 g/dL — ABNORMAL LOW (ref 12.0–15.0)
Immature Granulocytes: 1 %
Lymphocytes Relative: 24 %
Lymphs Abs: 0.5 K/uL — ABNORMAL LOW (ref 0.7–4.0)
MCH: 32.4 pg (ref 26.0–34.0)
MCHC: 31 g/dL (ref 30.0–36.0)
MCV: 104.3 fL — ABNORMAL HIGH (ref 80.0–100.0)
Monocytes Absolute: 0.2 K/uL (ref 0.1–1.0)
Monocytes Relative: 8 %
Neutro Abs: 1.5 K/uL — ABNORMAL LOW (ref 1.7–7.7)
Neutrophils Relative %: 67 %
Platelets: 176 K/uL (ref 150–400)
RBC: 3.46 MIL/uL — ABNORMAL LOW (ref 3.87–5.11)
RDW: 13.4 % (ref 11.5–15.5)
Smear Review: NORMAL
WBC: 2.3 K/uL — ABNORMAL LOW (ref 4.0–10.5)
nRBC: 2.2 % — ABNORMAL HIGH (ref 0.0–0.2)

## 2024-10-31 LAB — I-STAT CG4 LACTIC ACID, ED
Lactic Acid, Venous: 1.6 mmol/L (ref 0.5–1.9)
Lactic Acid, Venous: 2.2 mmol/L (ref 0.5–1.9)

## 2024-10-31 LAB — PROTIME-INR
INR: 0.9 (ref 0.8–1.2)
Prothrombin Time: 13.1 s (ref 11.4–15.2)

## 2024-10-31 LAB — TROPONIN I (HIGH SENSITIVITY)
Troponin I (High Sensitivity): 8 ng/L (ref <18)
Troponin I (High Sensitivity): 12 ng/L (ref <18)

## 2024-10-31 LAB — PHOSPHORUS: Phosphorus: 1.9 mg/dL — ABNORMAL LOW (ref 2.5–4.6)

## 2024-10-31 LAB — HCG, SERUM, QUALITATIVE: Preg, Serum: NEGATIVE

## 2024-10-31 LAB — MAGNESIUM: Magnesium: 1.7 mg/dL (ref 1.7–2.4)

## 2024-10-31 LAB — HIV ANTIBODY (ROUTINE TESTING W REFLEX): HIV Screen 4th Generation wRfx: NONREACTIVE

## 2024-10-31 MED ORDER — VANCOMYCIN HCL IN DEXTROSE 1-5 GM/200ML-% IV SOLN
1000.0000 mg | Freq: Once | INTRAVENOUS | Status: AC
  Administered 2024-10-31: 07:00:00 1000 mg via INTRAVENOUS
  Filled 2024-10-31: qty 200

## 2024-10-31 MED ORDER — ETOMIDATE 2 MG/ML IV SOLN
INTRAVENOUS | Status: AC
  Administered 2024-10-31: 13:00:00 10 mg
  Filled 2024-10-31: qty 10

## 2024-10-31 MED ORDER — DIVALPROEX SODIUM 125 MG PO CSDR
500.0000 mg | DELAYED_RELEASE_CAPSULE | Freq: Every day | ORAL | Status: DC
  Administered 2024-10-31: 21:00:00 500 mg via ORAL
  Filled 2024-10-31: qty 4

## 2024-10-31 MED ORDER — POTASSIUM CHLORIDE 20 MEQ PO PACK
40.0000 meq | PACK | Freq: Once | ORAL | Status: AC
  Administered 2024-10-31: 09:00:00 40 meq
  Filled 2024-10-31: qty 2

## 2024-10-31 MED ORDER — LEVETIRACETAM 750 MG PO TABS
2250.0000 mg | ORAL_TABLET | Freq: Every day | ORAL | Status: DC
  Administered 2024-10-31 – 2024-11-03 (×4): 2250 mg
  Filled 2024-10-31 (×4): qty 3

## 2024-10-31 MED ORDER — DIVALPROEX SODIUM 125 MG PO CSDR
375.0000 mg | DELAYED_RELEASE_CAPSULE | Freq: Two times a day (BID) | ORAL | Status: DC
  Administered 2024-11-01 – 2024-11-06 (×11): 375 mg via ORAL
  Filled 2024-10-31: qty 3
  Filled 2024-10-31: qty 375
  Filled 2024-10-31 (×6): qty 3
  Filled 2024-10-31: qty 375
  Filled 2024-10-31: qty 100
  Filled 2024-10-31: qty 3
  Filled 2024-10-31: qty 100
  Filled 2024-10-31 (×3): qty 3
  Filled 2024-10-31: qty 375
  Filled 2024-10-31: qty 3

## 2024-10-31 MED ORDER — NOREPINEPHRINE 4 MG/250ML-% IV SOLN
INTRAVENOUS | Status: AC
  Administered 2024-10-31: 15:00:00 5 ug/min via INTRAVENOUS
  Filled 2024-10-31: qty 250

## 2024-10-31 MED ORDER — DIVALPROEX SODIUM 125 MG PO CSDR
375.0000 mg | DELAYED_RELEASE_CAPSULE | Freq: Once | ORAL | Status: DC
  Filled 2024-10-31: qty 3

## 2024-10-31 MED ORDER — LACTATED RINGERS IV SOLN: INTRAVENOUS | Status: AC

## 2024-10-31 MED ORDER — VALPROIC ACID 250 MG/5ML PO SOLN
375.0000 mg | Freq: Every day | ORAL | Status: DC
  Filled 2024-10-31: qty 10

## 2024-10-31 MED ORDER — CALCIUM CARBONATE ANTACID 1250 MG/5ML PO SUSP: 200.0000 mg | Freq: Three times a day (TID) | ORAL | Status: DC | PRN

## 2024-10-31 MED ORDER — IPRATROPIUM-ALBUTEROL 0.5-2.5 (3) MG/3ML IN SOLN
3.0000 mL | Freq: Four times a day (QID) | RESPIRATORY_TRACT | Status: DC
  Administered 2024-10-31 – 2024-11-01 (×2): 3 mL via RESPIRATORY_TRACT
  Filled 2024-10-31 (×5): qty 3

## 2024-10-31 MED ORDER — MIDAZOLAM BOLUS VIA INFUSION
10.0000 mg | Freq: Once | INTRAVENOUS | Status: AC
  Administered 2024-10-31: 15:00:00 5 mg via INTRAVENOUS
  Filled 2024-10-31: qty 10

## 2024-10-31 MED ORDER — ENOXAPARIN SODIUM 30 MG/0.3ML IJ SOSY
30.0000 mg | PREFILLED_SYRINGE | INTRAMUSCULAR | Status: DC
  Administered 2024-10-31 – 2024-11-02 (×3): 30 mg via SUBCUTANEOUS
  Filled 2024-10-31 (×3): qty 0.3

## 2024-10-31 MED ORDER — ACETAMINOPHEN 650 MG RE SUPP: 650.0000 mg | Freq: Four times a day (QID) | RECTAL | Status: DC | PRN

## 2024-10-31 MED ORDER — LEVETIRACETAM 500 MG PO TABS
2000.0000 mg | ORAL_TABLET | Freq: Every day | ORAL | Status: DC
  Administered 2024-10-31 – 2024-11-04 (×5): 2000 mg
  Filled 2024-10-31 (×7): qty 4

## 2024-10-31 MED ORDER — CLONAZEPAM 0.125 MG PO TBDP
0.2500 mg | ORAL_TABLET | Freq: Once | ORAL | Status: DC
  Filled 2024-10-31: qty 2

## 2024-10-31 MED ORDER — LACTATED RINGERS IV BOLUS
1000.0000 mL | Freq: Once | INTRAVENOUS | Status: AC
  Administered 2024-10-31: 09:00:00 1000 mL via INTRAVENOUS

## 2024-10-31 MED ORDER — ACETAMINOPHEN 325 MG PO TABS: 650.0000 mg | ORAL_TABLET | Freq: Four times a day (QID) | ORAL | Status: DC | PRN

## 2024-10-31 MED ORDER — SODIUM CHLORIDE 0.9 % IV SOLN
250.0000 mL | INTRAVENOUS | Status: AC
  Administered 2024-10-31: 16:00:00 250 mL via INTRAVENOUS

## 2024-10-31 MED ORDER — CHLORHEXIDINE GLUCONATE 0.12 % MT SOLN
15.0000 mL | Freq: Once | OROMUCOSAL | Status: AC
  Administered 2024-10-31: 19:00:00 15 mL via OROMUCOSAL

## 2024-10-31 MED ORDER — POLYETHYLENE GLYCOL 3350 17 G PO PACK: 17.0000 g | PACK | Freq: Every day | ORAL | Status: DC | PRN

## 2024-10-31 MED ORDER — DIVALPROEX SODIUM 125 MG PO CSDR
500.0000 mg | DELAYED_RELEASE_CAPSULE | Freq: Every day | ORAL | Status: DC
  Administered 2024-11-01 – 2024-11-05 (×5): 500 mg via ORAL
  Filled 2024-10-31: qty 500
  Filled 2024-10-31 (×6): qty 4

## 2024-10-31 MED ORDER — CHLORHEXIDINE GLUCONATE CLOTH 2 % EX PADS
6.0000 | MEDICATED_PAD | Freq: Every day | CUTANEOUS | Status: DC
  Administered 2024-10-31 – 2024-11-05 (×5): 6 via TOPICAL

## 2024-10-31 MED ORDER — ACETAMINOPHEN 650 MG RE SUPP
RECTAL | Status: AC
  Administered 2024-10-31: 07:00:00 650 mg
  Filled 2024-10-31: qty 1

## 2024-10-31 MED ORDER — FAMOTIDINE 20 MG PO TABS
20.0000 mg | ORAL_TABLET | Freq: Two times a day (BID) | ORAL | Status: DC
  Administered 2024-10-31 – 2024-11-01 (×4): 20 mg
  Filled 2024-10-31 (×4): qty 1

## 2024-10-31 MED ORDER — FAMOTIDINE 20 MG PO TABS: 20.0000 mg | ORAL_TABLET | Freq: Two times a day (BID) | ORAL | Status: DC

## 2024-10-31 MED ORDER — FENTANYL CITRATE (PF) 50 MCG/ML IJ SOSY: 50.0000 ug | PREFILLED_SYRINGE | INTRAMUSCULAR | Status: DC | PRN

## 2024-10-31 MED ORDER — VALPROIC ACID 250 MG/5ML PO SOLN
375.0000 mg | Freq: Every day | ORAL | Status: DC
  Administered 2024-10-31: 11:00:00 375 mg
  Filled 2024-10-31: qty 10

## 2024-10-31 MED ORDER — DIVALPROEX SODIUM 125 MG PO CSDR: 500.0000 mg | DELAYED_RELEASE_CAPSULE | Freq: Every day | ORAL | Status: DC

## 2024-10-31 MED ORDER — SODIUM CHLORIDE 0.9 % IV SOLN
100.0000 mg | Freq: Two times a day (BID) | INTRAVENOUS | Status: DC
  Filled 2024-10-31: qty 100

## 2024-10-31 MED ORDER — ROCURONIUM BROMIDE 10 MG/ML (PF) SYRINGE
PREFILLED_SYRINGE | INTRAVENOUS | Status: AC
  Administered 2024-10-31: 13:00:00 50 mg
  Filled 2024-10-31: qty 10

## 2024-10-31 MED ORDER — IBUPROFEN 100 MG/5ML PO SUSP: 300.0000 mg | ORAL | Status: DC | PRN

## 2024-10-31 MED ORDER — DIAZEPAM 20 MG RE GEL: 12.5000 mg | RECTAL | Status: DC | PRN

## 2024-10-31 MED ORDER — LEVETIRACETAM 100 MG/ML PO SOLN: 2000.0000 mg | ORAL | Status: DC

## 2024-10-31 MED ORDER — SODIUM CHLORIDE 0.9 % IV SOLN
2.0000 g | INTRAVENOUS | Status: DC
  Administered 2024-10-31 – 2024-11-03 (×4): 2 g via INTRAVENOUS
  Filled 2024-10-31 (×4): qty 20

## 2024-10-31 MED ORDER — DEXMEDETOMIDINE HCL IN NACL 400 MCG/100ML IV SOLN: 0.0000 ug/kg/h | INTRAVENOUS | Status: DC

## 2024-10-31 MED ORDER — DIVALPROEX SODIUM 125 MG PO CSDR: 375.0000 mg | DELAYED_RELEASE_CAPSULE | ORAL | Status: DC

## 2024-10-31 MED ORDER — DIVALPROEX SODIUM 125 MG PO DR TAB
375.0000 mg | DELAYED_RELEASE_TABLET | Freq: Two times a day (BID) | ORAL | Status: DC
  Administered 2024-10-31: 16:00:00 375 mg via ORAL
  Filled 2024-10-31 (×4): qty 3

## 2024-10-31 MED ORDER — ACETAMINOPHEN 325 MG PO TABS
650.0000 mg | ORAL_TABLET | Freq: Four times a day (QID) | ORAL | Status: DC | PRN
  Administered 2024-11-01: 04:00:00 650 mg
  Filled 2024-10-31: qty 2

## 2024-10-31 MED ORDER — DEXMEDETOMIDINE HCL IN NACL 400 MCG/100ML IV SOLN
INTRAVENOUS | Status: AC
  Filled 2024-10-31: qty 100

## 2024-10-31 MED ORDER — FENTANYL CITRATE (PF) 50 MCG/ML IJ SOSY
PREFILLED_SYRINGE | INTRAMUSCULAR | Status: AC
  Administered 2024-10-31: 13:00:00 25 ug
  Filled 2024-10-31: qty 1

## 2024-10-31 MED ORDER — CLONAZEPAM 0.25 MG PO TBDP
0.2500 mg | ORAL_TABLET | Freq: Every day | ORAL | Status: DC
  Administered 2024-10-31 – 2024-11-06 (×7): 0.25 mg
  Filled 2024-10-31 (×8): qty 1

## 2024-10-31 MED ORDER — VALPROIC ACID 250 MG/5ML PO SOLN: 500.0000 mg | Freq: Every day | ORAL | Status: DC

## 2024-10-31 MED ORDER — MIDAZOLAM HCL 2 MG/2ML IJ SOLN
INTRAMUSCULAR | Status: AC
  Administered 2024-10-31: 13:00:00 1 mg
  Filled 2024-10-31: qty 2

## 2024-10-31 MED ORDER — LACTATED RINGERS IV BOLUS (SEPSIS)
500.0000 mL | Freq: Once | INTRAVENOUS | Status: AC
  Administered 2024-10-31: 07:00:00 500 mL via INTRAVENOUS

## 2024-10-31 MED ORDER — SODIUM CHLORIDE 0.9 % IV SOLN
100.0000 mg | Freq: Two times a day (BID) | INTRAVENOUS | Status: AC
  Administered 2024-10-31 – 2024-11-02 (×5): 100 mg via INTRAVENOUS
  Filled 2024-10-31 (×7): qty 100

## 2024-10-31 MED ORDER — CANNABIDIOL 100 MG/ML PO SOLN
350.0000 mg | Freq: Two times a day (BID) | ORAL | Status: DC
  Administered 2024-10-31 – 2024-11-06 (×12): 350 mg
  Filled 2024-10-31 (×15): qty 3.5

## 2024-10-31 MED ORDER — MIDAZOLAM-SODIUM CHLORIDE 100-0.9 MG/100ML-% IV SOLN
2.0000 mg/h | INTRAVENOUS | Status: DC
  Administered 2024-10-31: 15:00:00 2 mg/h via INTRAVENOUS
  Filled 2024-10-31: qty 100

## 2024-10-31 MED ORDER — DIAZEPAM 2.5 MG RE GEL: 2.5000 mg | RECTAL | Status: DC | PRN

## 2024-10-31 MED ORDER — NOREPINEPHRINE 4 MG/250ML-% IV SOLN
0.0000 ug/min | INTRAVENOUS | Status: DC
  Administered 2024-10-31: 15:00:00 5 ug/min via INTRAVENOUS

## 2024-10-31 MED ORDER — CLONAZEPAM 0.25 MG PO TBDP
0.5000 mg | ORAL_TABLET | Freq: Every day | ORAL | Status: DC
  Administered 2024-10-31 – 2024-11-05 (×6): 0.5 mg
  Filled 2024-10-31 (×6): qty 2

## 2024-10-31 MED ORDER — LACTATED RINGERS IV BOLUS
500.0000 mL | Freq: Once | INTRAVENOUS | Status: AC
  Administered 2024-10-31: 15:00:00 500 mL via INTRAVENOUS

## 2024-10-31 MED ORDER — ORAL CARE MOUTH RINSE
15.0000 mL | OROMUCOSAL | Status: DC
  Administered 2024-10-31 – 2024-11-06 (×68): 15 mL via OROMUCOSAL

## 2024-10-31 MED ORDER — PHENYLEPHRINE 80 MCG/ML (10ML) SYRINGE FOR IV PUSH (FOR BLOOD PRESSURE SUPPORT)
PREFILLED_SYRINGE | INTRAVENOUS | Status: AC
  Administered 2024-10-31: 13:00:00 80 ug
  Filled 2024-10-31: qty 10

## 2024-10-31 MED ORDER — ORAL CARE MOUTH RINSE: 15.0000 mL | OROMUCOSAL | Status: DC | PRN

## 2024-10-31 MED ORDER — SODIUM CHLORIDE 0.9 % IV SOLN
100.0000 mg | Freq: Once | INTRAVENOUS | Status: AC
  Administered 2024-10-31: 08:00:00 100 mg via INTRAVENOUS
  Filled 2024-10-31: qty 100

## 2024-10-31 MED ORDER — PERAMPANEL 0.5 MG/ML PO SUSP
4.0000 mL | Freq: Every day | ORAL | Status: DC
  Administered 2024-10-31 – 2024-11-05 (×6): 4 mL
  Filled 2024-10-31 (×7): qty 4

## 2024-10-31 MED ORDER — OSMOLITE 1.5 CAL PO LIQD
1000.0000 mL | ORAL | Status: AC
  Administered 2024-10-31: 17:00:00 1000 mL
  Filled 2024-10-31: qty 1000

## 2024-10-31 MED ORDER — VANCOMYCIN HCL 500 MG/100ML IV SOLN
500.0000 mg | Freq: Two times a day (BID) | INTRAVENOUS | Status: DC
  Administered 2024-10-31 – 2024-11-01 (×2): 500 mg via INTRAVENOUS
  Filled 2024-10-31 (×2): qty 100

## 2024-10-31 NOTE — TOC Initial Note (Signed)
 Transition of Care [facility]) - Initial/Assessment Note    Patient Details  Name: Jo Matthews MRN: [id number] Date of Birth: [birth year]  Transition of Care [facility]) CM/SW Contact:    Lauraine FORBES Saa, LCSWA Phone Number: 10/31/2024, 12:58 PM  Clinical Narrative:                  12:58 PM CSW introduced self and role to patient's mother, [name]. [name] confirmed patient resides with her. [name] confirmed patient is nonverbal at baseline and uses 2L oxygen at baseline. Dina confirmed patient's PCP, preferred pharmacy ([facility] [place]) and insurance coverage. [name] confirmed patient does not have SNF history. Dina informed CSW that patient is currently active with [facility]. Dina informed CSW that patient uses a hoyer lift, respiratory vest, oxygen machines, suction, and manual wheelchair at home. Dina informed CSW that patient's shower chair broke and expressed interest in a new one but did not have a preferred DME agency. TOC will continue to follow.  Expected Discharge Plan: Home/Self Care Barriers to Discharge: Continued Medical Work up   Patient Goals and CMS Choice            Expected Discharge Plan and Services   Discharge Planning Services: CM Consult   Living arrangements for the past 2 months: Single Family Home                                      Prior Living Arrangements/Services Living arrangements for the past 2 months: Single Family Home Lives with:: Parents Patient language and need for interpreter reviewed:: Yes        Need for Family Participation in Patient Care: Yes (Comment) Care giver support system in place?: Yes (comment) Current home services: DME, Home RN Criminal Activity/Legal Involvement Pertinent to Current Situation/Hospitalization: No - Comment as needed  Activities of Daily Living      Permission Sought/Granted Permission sought to share information with : Family Supports Permission granted to share information with : No (Contact  information on chart)  Share Information with NAME: Candie Gintz  Permission granted to share info w AGENCY: [facility]  Permission granted to share info w Relationship: Mother  Permission granted to share info w Contact Information: [phone]  Emotional Assessment         Alcohol  / Substance Use: Not Applicable Psych Involvement: No (comment)  Admission diagnosis:  Sepsis (HCC) [A41.9] Acute respiratory failure with hypoxia (HCC) [J96.01] Patient Active Problem List   Diagnosis Date Noted   Sepsis (HCC) 10/31/2024   Acute respiratory failure with hypoxia (HCC) 10/31/2024   Seizure disorder (HCC) 10/31/2024   Community acquired pneumonia 10/31/2024   Acute on chronic respiratory failure with hypoxia (HCC) 10/31/2024   Acute on chronic hypoxic respiratory failure (HCC) 10/09/2023   Seizures (HCC) 01/21/2023   BMI (body mass index), pediatric, less than 5th percentile for age 67/17/2020   Cerebral palsy, diplegic, 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 [place] [address] Phone: [phone] Fax: [phone]     Social Drivers of Health (SDOH) Social History: SDOH Screenings   Food Insecurity: Patient Unable To Answer (10/10/2023)  Housing: Patient Declined (10/10/2023)  Transportation Needs: No Transportation Needs (10/10/2023)  Utilities: Not At Risk (10/10/2023)  Tobacco Use: Low Risk (10/31/2024)   SDOH Interventions:     Readmission Risk Interventions    10/14/2023    5:00 PM  Readmission Risk Prevention Plan  Post Dischage Appt Complete  Medication Screening Complete  Transportation Screening Complete

## 2024-10-31 NOTE — Progress Notes (Signed)
 EEG complete - results pending

## 2024-10-31 NOTE — Consult Note (Signed)
 NAME:  Jo Matthews, MRN:  [id number], DOB:  October 26, 1996, LOS: 0 ADMISSION DATE:  10/31/2024, CONSULTATION DATE: 12/15 REFERRING MD: Dr. Tobie IM TS, CHIEF COMPLAINT: Shortness of breath  History of Present Illness:  28 year old female with past medical history significant for Rett syndrome, wheelchair/bedbound, nonverbal at baseline, chronic respiratory failure with hypoxia on 2 L, PEG tube dependent, and seizure history on multiple AEDs.  She was in her usual state of health until 12/14 when she had 1 episode of hypoxia (wears pulse ox at home).  This recovered spontaneously and she was again doing fine until 1215 early a.m. when she was increasingly lethargic as well as hypoxic with sats in the 70s and 80s.  Also had new onset fever.  Imaging with concern for pneumonia and she was admitted to the internal medicine teaching service for management thereof.  Vancomycin  and doxycycline  were initiated.  While still in the ED she became increasingly hypoxic requiring up titration of heated high flow nasal cannula to 80% FiO2 and 40 L/min.  Despite this she remained in the high 80s to low 90s and PCCM was asked to evaluate for ICU care.  Pertinent  Medical History   has a past medical history of Fracture, humerus closed, G tube feedings (HCC), Pneumonia, Recurrent fever of unknown cause (08/31/2012), Rett's syndrome, Scoliosis, Seizure disorder (HCC), Seizures (HCC), and Weight loss.   Significant Hospital Events: Including procedures, antibiotic start and stop dates in addition to other pertinent events   12/15 admit to ICU for respiratory failure  Interim History / Subjective:    Objective    Blood pressure 109/70, pulse (!) 141, temperature (!) 102.8 F (39.3 C), temperature source Rectal, resp. rate (!) 40, height 4' 11 (1.499 m), weight 45.4 kg, SpO2 92%.    FiO2 (%):  [80 %-100 %] 100 %  No intake or output data in the 24 hours ending 10/31/24 1109 Filed Weights   10/31/24 0648  Weight:  45.4 kg    Examination: General: Young adult female resting in bed HENT: /AT, PERRL, no JVD Lungs: Diminished L base, Rhonchi R base. Tachypnea with rates in the upper 30s, lower 40s.  Cardiovascular: Tachy, regular, no MRG Abdomen: Soft, NT, ND Extremities: Warm, no edema.  Neuro: Eyes open briefly to touch. Does not follow commands. Nonverbal baseline.    Resolved problem list   Assessment and Plan   Acute on chronic respiratory failure with hypoxia: Imaging in the ED is concerning for pneumonia, but not significant different from her Xray appears chronically. On 2L home oxygen. Family reports something similar in the past, which was resolved with bronchoscopy and suctioning of mucous.  CAP - Intubate upon arrival to ICU - Full vent support - Precedex  and PRN fentanyl  for RASS goal 0 to -1.  - VAP bundle - Antibiotics transition to ceftriaxone  and doxycycline  - Tracheal aspirate, RVP, Blood cultures - Lower extremity dopplers - Consider PE rule out. - Urine strep, legionella - RVP  Seizure history: on multiple AEDs. Family very worried about AEDs being help. IMTS has restarted all AEDs - Appreciate neurology consultation - Keppra , clonazepam , divalproex , epidiolex , and perampanel  - EEG monitoring  Rett syndrome - Supportive care  Nutrition - TF via G tube  Hypokalemia  - replete K   Labs   CBC: Recent Labs  Lab 10/31/24 0639 10/31/24 0649 10/31/24 0650 10/31/24 0907  WBC 2.3*  --   --   --   NEUTROABS 1.5*  --   --   --  HGB 11.2* 12.6 11.9* 9.5*  HCT 36.1 37.0 35.0* 28.0*  MCV 104.3*  --   --   --   PLT 176  --   --   --     Basic Metabolic Panel: Recent Labs  Lab 10/31/24 0639 10/31/24 0649 10/31/24 0650 10/31/24 0907  NA 138 137 136 135  K 3.2* 2.9* 2.8* 2.8*  CL 90* 88*  --   --   CO2 36*  --   --   --   GLUCOSE 142* 147*  --   --   BUN 9 11  --   --   CREATININE 0.42* 0.50  --   --   CALCIUM  9.4  --   --   --    GFR: Estimated  Creatinine Clearance: 71.4 mL/min (by C-G formula based on SCr of 0.5 mg/dL). Recent Labs  Lab 10/31/24 0639 10/31/24 0649 10/31/24 0907  WBC 2.3*  --   --   LATICACIDVEN  --  1.6 2.2*    Liver Function Tests: Recent Labs  Lab 10/31/24 0639  AST 38  ALT 19  ALKPHOS 60  BILITOT 0.7  PROT 6.6  ALBUMIN 2.7*   No results for input(s): LIPASE, AMYLASE in the last 168 hours. No results for input(s): AMMONIA in the last 168 hours.  ABG    Component Value Date/Time   PHART 7.475 (H) 12/04/2014 1154   PCO2ART 40.1 12/04/2014 1154   PO2ART 268.0 (H) 12/04/2014 1154   HCO3 42.2 (H) 10/31/2024 0907   TCO2 44 (H) 10/31/2024 0907   O2SAT 99 10/31/2024 0907     Coagulation Profile: Recent Labs  Lab 10/31/24 0639  INR 0.9    Cardiac Enzymes: No results for input(s): CKTOTAL, CKMB, CKMBINDEX, TROPONINI in the last 168 hours.  HbA1C: No results found for: HGBA1C  CBG: No results for input(s): GLUCAP in the last 168 hours.  Review of Systems:   Patient is encephalopathic and/or intubated; therefore, history has been obtained from chart review.   Past Medical History:  She,  has a past medical history of Fracture, humerus closed, G tube feedings (HCC), Pneumonia, Recurrent fever of unknown cause (08/31/2012), Rett's syndrome, Scoliosis, Seizure disorder (HCC), Seizures (HCC), and Weight loss.   Surgical History:   Past Surgical History:  Procedure Laterality Date   BACK SURGERY     BRONCHIAL WASHINGS  10/16/2023   Procedure: BRONCHIAL WASHINGS;  Surgeon: Neda Jennet LABOR, MD;  Location: [facility];  Service: Endoscopy;;   GASTROSTOMY TUBE PLACEMENT     SPINAL GROWTH RODS     SPINE SURGERY N/A    [employer] 02/14/2021   VIDEO BRONCHOSCOPY Left 10/16/2023   Procedure: VIDEO BRONCHOSCOPY WITHOUT FLUORO;  Surgeon: Neda Jennet LABOR, MD;  Location: [facility];  Service: Endoscopy;  Laterality: Left;  mucus plugging     Social History:   reports  that she has never smoked. She has never used smokeless tobacco. She reports that she does not drink alcohol  and does not use drugs.   Family History:  Her family history includes Diabetes in an other family member; Heart disease in an other family member; Hyperlipidemia in an other family member.   Allergies Allergies[1]   Home Medications  Prior to Admission medications  Medication Sig Start Date End Date Taking? Authorizing Provider  albuterol  (PROVENTIL ) (2.5 MG/3ML) 0.083% nebulizer solution Take 3 mLs (2.5 mg total) by nebulization every 6 (six) hours as needed for wheezing or shortness of breath. 03/29/24  Yes Ramgoolam,  Gustav, MD  Calcium  Carbonate Antacid (CALCIUM  CARBONATE, DOSED IN MG ELEMENTAL CALCIUM ,) 1250 MG/5ML SUSP Place 2 mLs (200 mg of elemental calcium  total) into feeding tube 3 (three) times daily as needed for indigestion. 05/04/21  Yes Cheryle Page, MD  cannabidiol  (EPIDIOLEX ) 100 MG/ML solution Place 350 mg into feeding tube in the morning and at bedtime. 03/11/21  Yes [provider]  clonazePAM  (KLONOPIN ) 0.25 MG disintegrating tablet TAKE 1 TAB BY MOUTH IN MORNING & 2 TABS AT NIGHT WITH 15 EXTRA FOR BREAKTHROUGH SEIZURE FOR 30 DAYS 06/03/24 10/31/24 Yes Ramgoolam, Andres, MD  diazepam  (DIASAT) 20 MG GEL Place 12.5 mg rectally as needed (for a seizure lasting more than 5 minutes). 09/18/21  Yes [provider]  divalproex  (DEPAKOTE  SPRINKLE) 125 MG capsule 375-500 mg See admin instructions. Place 375 mg into the tube in the morning & afternoon and 500 mg at bedtime   Yes [provider]  FYCOMPA  0.5 MG/ML SUSP Place 2 mg into feeding tube at bedtime.   Yes [provider]  guaiFENesin  (ROBITUSSIN CHEST CONGESTION PO) Place 10 mLs into feeding tube daily as needed (cough).   Yes [provider]  ibuprofen  (ADVIL ) 100 MG/5ML suspension Place 300 mg into feeding tube every 4 (four) hours as needed for mild pain.   Yes [provider]  levETIRAcetam  (KEPPRA ) 100 MG/ML solution Place 2,000-2,250 mg into feeding tube See admin instructions. Place 2,000 mg (20 ml's) into the tube in the morning and 2,250 mg (22.5 ml's) at bedtime 01/08/18  Yes [provider]  levocetirizine (XYZAL ) 2.5 MG/5ML solution Take 10 mLs (5 mg total) by mouth every evening. 03/10/24 10/31/24 Yes Ramgoolam, Gustav, MD  Multiple Vitamin (MULTIVITAMIN PO) Give 1 tablet by tube daily.   Yes [provider]  mupirocin  ointment (BACTROBAN ) 2 % APPLY TO AFFECTED AREA 3 TIMES A DAY Patient taking differently: Apply 1 Application topically 2 (two) times daily as needed. 03/10/24  Yes Ramgoolam, Andres, MD  polyethylene glycol powder (GLYCOLAX /MIRALAX ) 17 GM/SCOOP powder Place 255 g into feeding tube daily as needed for mild constipation. Patient taking differently: Place 8.5-17 g into feeding tube daily as needed for mild constipation (mix as directed). 05/04/21  Yes Cheryle Page, MD  sodium chloride  HYPERTONIC 3 % nebulizer solution Take by nebulization as needed for other (as needed to loosen cough, congestion,  two or three times a day). 10/18/23  Yes Pokhrel, Laxman, MD  lacosamide  (VIMPAT ) 50 MG TABS tablet Take by mouth. Patient not taking: No sig reported 06/17/18 05/01/21  [provider]     Critical care time: 46 minutes     Deward Eastern, AGACNP-BC [facility]  See Amion for personal pager PCCM on call pager [phone] until 7pm. Please call Elink 7p-7a. [phone]  10/31/2024 12:27 PM              [1]  Allergies Allergen Reactions   Cephalexin  Other (See Comments)    Seizures    Clobazam  Shortness Of Breath and Other (See Comments)    Severe side effects = Respiratory distress   Penicillins Shortness Of Breath and Other (See Comments)    Respiratory distress

## 2024-10-31 NOTE — ED Provider Notes (Signed)
 Clinical Course as of 10/31/24 0843  Mon Oct 31, 2024  0708 28 y/o 2L baseline. Fevers, hypoxic at home. Sats 80s on 6L with EMS. Wheezing, tachypnic and tachycardiac. Received rocphin with EMS. Vanc/Doxy started here. Started on HFNC.  [TY]  [id number] Patient evaluated.  Is still tachypneic, but appears comfortable.  I do not appreciate wheezing on my exam, but does have diffuse rhonchi.  Mother does note that patient has not had her seizure medications this morning.  Will order to avoid breakthrough seizure [TY]  0843 Spoke with IM for admission. [TY]    Clinical Course User Index [TY] Neysa Caron JINNY ROSALEA Neysa Caron JINNY, [place] 10/31/24 [phone]

## 2024-10-31 NOTE — Progress Notes (Signed)
 Pharmacy Antibiotic Note  Jo Matthews is a 28 y.o. female admitted on 10/31/2024 with sepsis.  Pharmacy has been consulted for vancomycin  dosing.  Plan: Vancomycin  500 mg q12h for AUC of 481 F/u MRSA PCR, cultures, length of therapy   Height: 4' 11 (149.9 cm) Weight: 45.4 kg (100 lb 1.4 oz) IBW/kg (Calculated) : 43.2  Temp (24hrs), Avg:102.8 F (39.3 C), Min:102.8 F (39.3 C), Max:102.8 F (39.3 C)  Recent Labs  Lab 10/31/24 0639 10/31/24 0649  WBC 2.3*  --   CREATININE 0.42* 0.50  LATICACIDVEN  --  1.6    Estimated Creatinine Clearance: 71.4 mL/min (by C-G formula based on SCr of 0.5 mg/dL).    Allergies[1]  Antimicrobials this admission: Vancomycin  12/15 >> c Doxycycline  12/15 >> c  Microbiology results: 12/15 BCx: pending  12/15 MRSA PCR: pending  Thank you for allowing pharmacy to be a part of this patients care.  Rankin Sams 10/31/2024 8:50 AM     [1]  Allergies Allergen Reactions   Cephalexin  Other (See Comments)    Seizures    Clobazam  Shortness Of Breath and Other (See Comments)    Severe side effects = Respiratory distress   Penicillins Shortness Of Breath and Other (See Comments)    Respiratory distress

## 2024-10-31 NOTE — ED Notes (Signed)
 Dr. Neysa notified of pt's elevated lactic acid

## 2024-10-31 NOTE — Progress Notes (Signed)
 LTM EEG hooked up and running - no initial skin breakdown - push button tested - [facility] NOT monitoring while patient is in ED.

## 2024-10-31 NOTE — Progress Notes (Signed)
 Brief Nutrition Note  Consult received for enteral/tube feeding initiation and management.  Adult Enteral Nutrition Protocol initiated. Full assessment to follow.  PEG tube in place and OGT with tip located in stomach per xray imaging.   Admitting Dx: Sepsis (HCC) [A41.9] Acute respiratory failure with hypoxia (HCC) [J96.01]  Body mass index is 21.33 kg/m. Pt meets criteria for normal weight based on current BMI.  Labs:  Recent Labs  Lab 10/31/24 [phone] 10/31/24 0649 10/31/24 0650 10/31/24 0907 10/31/24 1355 10/31/24 1412  NA 138 137 136 135 135  --   K 3.2* 2.9* 2.8* 2.8* 4.2  --   CL 90* 88*  --   --   --   --   CO2 36*  --   --   --   --   --   BUN 9 11  --   --   --   --   CREATININE 0.42* 0.50  --   --   --   --   CALCIUM  9.4  --   --   --   --   --   MG  --   --   --   --   --  1.7  GLUCOSE 142* 147*  --   --   --   --     Vernell Lukes, RD, LDN, CNSC Registered Dietitian II Please reach out via secure chat

## 2024-10-31 NOTE — H&P (Signed)
 Date: 10/31/2024               Patient Name:  Jo Matthews MRN: [id number]  DOB: October 30, 1996 Age / Sex: 28 y.o., female   PCP: Darrol Merck, MD         Medical Service: Internal Medicine Teaching Service         Attending Physician: Dr. Kara Dorn NOVAK, MD      First Contact: Dr. Viktoria King, DO     Second Contact: Dr. 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  Mother gave most of history.  Patient has been at her father's house for the past week.  This morning at 4 AM, patient had worsening hypoxia and therefore mother brought her to the emergency department.  At baseline patient has chronic resp failure and is on 2 L of nasal cannula at home.  Patient has been febrile for the past day.  Patient is also becoming hypoxic.  Has been admitted for pneumonia in the past.  Has had no sick contacts.  At baseline patient is wheelchair-bound and bedbound.  Needs help with all ADLs and IADLs.  En route to the emergency department EMS gave the patient Rocephin .  ED Course: Initial vital signs showing temperature 102.8 F, pulse 141, respirations 40, blood pressure 109/70 satting at 92% on high flow nasal cannula.  In the ED labs pertinent for potassium 3.2, white count of 2.3, hemoglobin 11.2, lactate 1.6, VBG pCO2 73.  Chest x-ray showing evidence of multilobar pneumonia. Sepsis protocol in place with patient receiving 500 mL of lactated Ringer 's with lactated Ringer 's infusion.  Patient was given vancomycin  and doxycycline .  With concern for sepsis, IMTS consulted  for admission.  Past Medical History Past Medical History:  Diagnosis Date   Fracture, humerus closed    G tube feedings (HCC)    Pneumonia    Recurrent fever of unknown cause 08/31/2012   Fever to 104-105 lasting 24 hrs, occuring once a week for the past 5 weeks (onset Sept 2013)   Rett's syndrome    Scoliosis    Seizure disorder (HCC)    Seizures (HCC)    Weight loss      Meds:  Active Medications[1] Albuterol  nebulizer Calcium  carbonate antacid 200 mg 3 times daily as needed per tube Cannabidiol  350 mg per tube twice daily Klonopin  0.25 mg 1 tab morning, 2 tabs at night Diazepam  12.5 mg rectal as needed for seizures Depakote  375 mg in the morning and afternoon, 500 mg nightly Fycompa  2 mg nightly Guaifenesin  10 mL per tube daily as needed Ibuprofen  300 mg every 4 hours as needed Keppra  2000 mg every morning, 2250 mg nightly  Xyzal  5 mg every evening Multivitamin Mupirocin  ointment MiraLAX  as needed Hypertonic saline nebulizers as needed  Past Surgical History  Past Surgical History:  Procedure Laterality Date   BACK SURGERY     BRONCHIAL WASHINGS  10/16/2023   Procedure: BRONCHIAL WASHINGS;  Surgeon: Neda Jennet LABOR, MD;  Location: [facility];  Service: Endoscopy;;   GASTROSTOMY TUBE PLACEMENT     SPINAL  GROWTH RODS     SPINE SURGERY N/A    [employer] 02/14/2021   VIDEO BRONCHOSCOPY Left 10/16/2023   Procedure: VIDEO BRONCHOSCOPY WITHOUT FLUORO;  Surgeon: Neda Jennet LABOR, MD;  Location: [facility];  Service: Endoscopy;  Laterality: Left;  mucus plugging    Social:  Lives With: Mother Occupation: Unable to work Support: Great support in her family Level of Function: Dependent in all ADLs and IADLs PCP: Dr. Darrol of pediatrics  Substances: No substance use  Family History: No pertinent family history  Allergies: Allergies as of 10/31/2024 - Review Complete 10/31/2024  Allergen Reaction Noted   Cephalexin  Other (See Comments) 05/01/2021    Clobazam  Shortness Of Breath and Other (See Comments) 02/23/2014   Penicillins Shortness Of Breath and Other (See Comments) 11/12/2011    Review of Systems: A complete ROS was negative except as per HPI.   OBJECTIVE:   Physical Exam: Blood pressure 109/70, pulse (!) 141, temperature (!) 102.8 F (39.3 C), temperature source Rectal, resp. rate (!) 40, height 4' 11 (1.499 m), weight 45.4 kg, SpO2 92%.  Constitutional: Ill appearing, unable to participate in physician/patient interview HENT: normocephalic atraumatic Eyes: conjunctiva non-erythematous, not able to track Cardiovascular: Tachycardic, no murmurs, rubs, or gallops Pulmonary/Chest: Very poor inspiratory effort, tachypneic, labored Abdominal: Soft, nontender, normal active bowel sounds, G-tube in place MSK: No edema to bilateral lower extremities. Neurological: Not alert, not able to follow instructions  Labs: CBC    Component Value Date/Time   WBC 2.3 (L) 10/31/2024 0639   RBC 3.46 (L) 10/31/2024 0639   HGB 9.5 (L) 10/31/2024 0907   HCT 28.0 (L) 10/31/2024 0907   PLT 176 10/31/2024 0639   MCV 104.3 (H) 10/31/2024 0639   MCH 32.4 10/31/2024 0639   MCHC 31.0 10/31/2024 0639   RDW 13.4 10/31/2024 0639   LYMPHSABS 0.5 (L) 10/31/2024 0639   MONOABS 0.2 10/31/2024 0639   EOSABS 0.0 10/31/2024 0639   BASOSABS 0.0 10/31/2024 0639     CMP     Component Value Date/Time   NA 135 10/31/2024 0907   K 2.8 (L) 10/31/2024 0907   CL 88 (L) 10/31/2024 0649   CO2 36 (H) 10/31/2024 0639   GLUCOSE 147 (H) 10/31/2024 0649   BUN 11 10/31/2024 0649   CREATININE 0.50 10/31/2024 0649   CREATININE 0.22 (L) 12/26/2022 0913   CALCIUM  9.4 10/31/2024 0639   PROT 6.6 10/31/2024 0639   ALBUMIN 2.7 (L) 10/31/2024 0639   AST 38 10/31/2024 0639   ALT 19 10/31/2024 0639   ALKPHOS 60 10/31/2024 0639   BILITOT 0.7 10/31/2024 0639   GFRNONAA >60 10/31/2024 0639   GFRNONAA >89 07/16/2017 1526   GFRAA >60 08/05/2017 1019   GFRAA >89  07/16/2017 1526    Imaging:  Chest x-ray IMPRESSION: 1. Increased dense airspace consolidation in the left mid and both lower lung fields, favoring multilobar pneumonia. 2. Mild cardiomegaly without radiographic evidence of heart failure.  EKG: personally reviewed my interpretation is sinus tachycardia.  ASSESSMENT & PLAN:   Assessment & Plan by Problem: Principal Problem:   Acute respiratory failure with hypoxia (HCC) Active Problems:   Rett syndrome   Spastic quadriplegia (HCC)   Acute on chronic hypoxic respiratory failure (HCC)   Sepsis (HCC)   Seizure disorder (HCC)   Community acquired pneumonia   Acute on chronic respiratory failure with hypoxia (HCC)   Krystie Leiter is a 28 y.o. person living with a history of Rett syndrome, spastic quadriplegia, history of seizures, on  2 L nasal cannula at home who presents for shortness of breath and fevers.  Patient found to have severe sepsis in the setting of community acquired pneumonia.  Patient admitted for further evaluation and management.  #Severe sepsis #Acute on chronic hypoxemic respiratory failure #Community-acquired pneumonia Patient presents with fevers, shortness of breath and chest x-ray showing concern for multilobar pneumonia.  Patient was initially admitted under my care to progressive.  Patient is requiring high flow nasal cannula going at 45 L/min.  Ultimately patient required more airway support, and was ultimately transferred to the ICU for further evaluation and management. - Continue day 1 ceftriaxone  - Continue day 1 doxycycline  - Happy to resume care from ICU once patient is stable to return to the floor  #Acute encephalopathy #Seizure disorder Concern for subclinical seizures given encephalopathy.  EEG in place.  Neurology following.  Will resume home antiepileptics. - Long-term EEG in place - Neurology following, appreciate recommendation - Continue home Keppra  2000 mg a.m., 2250 mg p.m. - Continue  Depakote  375 mg in the morning and afternoon, 500 mg nightly - Continue Klonopin  0.25 mg every morning, 0.5 mg nightly -Of note, patient's home medications are stored in the pharmacy for dispensing while patient is inpatient  #Rett syndrome #Spastic quadriplegia Patient has a history of Rett syndrome and spastic quadriplegia.  No acute concerns regarding this at this time.  #G tube in place Patient history of G-tube placement.  Site looked well today.  Can access site for tube feeds. -Consult registered dietitian for tube feed initiation  Patient required transfer to the ICU.  Happy to resume care once patient stable to return to the floor.  Diet: NPO VTE: Enoxaparin  IVF: LR,Bolus Code: Full  Prior to Admission Living Arrangement: Home, living at home  Anticipated Discharge Location: Home Barriers to Discharge: Clinical Improvement   Dispo: Admit patient to Inpatient with expected length of stay greater than 2 midnights.  Signed: Tobie Gaines, DO Internal Medicine Resident PGY-3  Please page on call intern or resident: First contact: [phone] If no answer in 15 minutes, please contact senior pager at [phone]     [1]  Current Meds  Medication Sig   albuterol  (PROVENTIL ) (2.5 MG/3ML) 0.083% nebulizer solution Take 3 mLs (2.5 mg total) by nebulization every 6 (six) hours as needed for wheezing or shortness of breath.   Calcium  Carbonate Antacid (CALCIUM  CARBONATE, DOSED IN MG ELEMENTAL CALCIUM ,) 1250 MG/5ML SUSP Place 2 mLs (200 mg of elemental calcium  total) into feeding tube 3 (three) times daily as needed for indigestion.   cannabidiol  (EPIDIOLEX ) 100 MG/ML solution Place 350 mg into feeding tube in the morning and at bedtime.   clonazePAM  (KLONOPIN ) 0.25 MG disintegrating tablet TAKE 1 TAB BY MOUTH IN MORNING & 2 TABS AT NIGHT WITH 15 EXTRA FOR BREAKTHROUGH SEIZURE FOR 30 DAYS   diazepam  (DIASAT) 20 MG GEL Place 12.5 mg rectally as needed (for a seizure lasting more than 5  minutes).   divalproex  (DEPAKOTE  SPRINKLE) 125 MG capsule 375-500 mg See admin instructions. Place 375 mg into the tube in the morning & afternoon and 500 mg at bedtime   FYCOMPA  0.5 MG/ML SUSP Place 2 mg into feeding tube at bedtime.   guaiFENesin  (ROBITUSSIN CHEST CONGESTION PO) Place 10 mLs into feeding tube daily as needed (cough).   ibuprofen  (ADVIL ) 100 MG/5ML suspension Place 300 mg into feeding tube every 4 (four) hours as needed for mild pain.   levETIRAcetam  (KEPPRA ) 100 MG/ML solution Place 2,000-2,250 mg into  feeding tube See admin instructions. Place 2,000 mg (20 ml's) into the tube in the morning and 2,250 mg (22.5 ml's) at bedtime   levocetirizine (XYZAL ) 2.5 MG/5ML solution Take 10 mLs (5 mg total) by mouth every evening.   Multiple Vitamin (MULTIVITAMIN PO) Give 1 tablet by tube daily.   mupirocin  ointment (BACTROBAN ) 2 % APPLY TO AFFECTED AREA 3 TIMES A DAY (Patient taking differently: Apply 1 Application topically 2 (two) times daily as needed.)   polyethylene glycol powder (GLYCOLAX /MIRALAX ) 17 GM/SCOOP powder Place 255 g into feeding tube daily as needed for mild constipation. (Patient taking differently: Place 8.5-17 g into feeding tube daily as needed for mild constipation (mix as directed).)   sodium chloride  HYPERTONIC 3 % nebulizer solution Take by nebulization as needed for other (as needed to loosen cough, congestion,  two or three times a day).

## 2024-10-31 NOTE — Progress Notes (Signed)
 PCCM INTERVAL PROGRESS NOTE   Called to bedside for evaluation of hypotension following versed  bolus. Expected change in BP. Will support with LR bolus 500cc and norepinephrine  infusion peripherally.   ABG reviewed. ABG    Component Value Date/Time   PHART 7.397 10/31/2024 1355   PCO2ART 58.9 (H) 10/31/2024 1355   PO2ART 185 (H) 10/31/2024 1355   HCO3 36.2 (H) 10/31/2024 1355   TCO2 38 (H) 10/31/2024 1355   O2SAT 100 10/31/2024 1355    FiO2 decreased from 100% to 60%. Wean as tolerated to keep O2 sats > 92%    Deward Eastern, AGACNP-BC [facility]  See Amion for personal pager PCCM on call pager [phone] until 7pm. Please call Elink 7p-7a. [phone]  10/31/2024 3:21 PM

## 2024-10-31 NOTE — Sepsis Progress Note (Signed)
 Sepsis protocol is being followed by e[facility].

## 2024-10-31 NOTE — ED Provider Notes (Signed)
 [place] EMERGENCY DEPARTMENT AT [facility] Provider Note   CSN: [id number] Arrival date & time: 10/31/24  [id number]     Patient presents with: No chief complaint on file.   Jo Matthews is a 28 y.o. female.   The history is provided by the EMS personnel, a parent and medical records.  Jo Matthews is a 28 y.o. female who presents to the Emergency Department complaining of low oxygen.  She presents to the emergency department by EMS for evaluation of low oxygen levels that started earlier today.  She has a history of Rett syndrome and is on 2 L oxygen at baseline.  Her mother picked her up from her father's house and noted that she developed a fever around 4 PM and she was given ibuprofen .  After that she went to sleep.  Her mother awoke to alarms that her oxygen was low and it was noted to be in the 70s 58s.  EMS was called because her oxygen sats stayed low even though her oxygen was turned up to 6 L nasal cannula.  EMS placed her on a nonrebreather.  At baseline she is nonverbal and will look around the room and has very expressive facial movements.  She does have seizures.  She is PEG tube dependent.  Patient has had increased congestion starting today.     Prior to Admission medications  Medication Sig Start Date End Date Taking? Authorizing Provider  albuterol  (PROVENTIL ) (2.5 MG/3ML) 0.083% nebulizer solution Take 3 mLs (2.5 mg total) by nebulization every 6 (six) hours as needed for wheezing or shortness of breath. 03/29/24   Ramgoolam, Andres, MD  Calcium  Carbonate Antacid (CALCIUM  CARBONATE, DOSED IN MG ELEMENTAL CALCIUM ,) 1250 MG/5ML SUSP Place 2 mLs (200 mg of elemental calcium  total) into feeding tube 3 (three) times daily as needed for indigestion. 05/04/21   Cheryle Page, MD  cannabidiol  (EPIDIOLEX ) 100 MG/ML solution Place 350 mg into feeding tube in the morning and at bedtime. 03/11/21   [provider]  cetirizine  HCl (ZYRTEC ) 5 MG/5ML SOLN TAKE 10 MLS (10 MG  TOTAL) BY MOUTH DAILY 06/10/24   Darrol Merck, MD  clonazePAM  (KLONOPIN ) 0.25 MG disintegrating tablet TAKE 1 TAB BY MOUTH IN MORNING & 2 TABS AT NIGHT WITH 15 EXTRA FOR BREAKTHROUGH SEIZURE FOR 30 DAYS 06/03/24 07/03/24  Ramgoolam, Andres, MD  diazepam  (DIASAT) 20 MG GEL Place 12.5 mg rectally as needed (for a seizure lasting more than 5 minutes). 09/18/21   [provider]  divalproex  (DEPAKOTE  SPRINKLE) 125 MG capsule 375-500 mg See admin instructions. Place 375 mg into the tube in the morning & afternoon and 500 mg at bedtime    [provider]  doxycycline  (VIBRAMYCIN ) 100 MG capsule Take 1 capsule (100 mg total) by mouth 2 (two) times daily. Patient not taking: Reported on 06/16/2024 04/13/24   Beverley Doffing A, PA-C  FYCOMPA  0.5 MG/ML SUSP Place 2 mg into feeding tube at bedtime.    [provider]  guaiFENesin  (ROBITUSSIN CHEST CONGESTION PO) Place 10 mLs into feeding tube daily as needed (cough).    [provider]  ibuprofen  (ADVIL ) 100 MG/5ML suspension Place 300 mg into feeding tube every 4 (four) hours as needed for mild pain.    [provider]  levETIRAcetam  (KEPPRA ) 100 MG/ML solution Place 2,000-2,250 mg into feeding tube See admin instructions. Place 2,000 mg (20 ml's) into the tube in the morning and 2,250 mg (22.5 ml's) at bedtime 01/08/18   [provider]  levocetirizine (XYZAL ) 2.5 MG/5ML solution Take 10 mLs (5 mg total) by mouth every evening. 03/10/24 04/09/24  Darrol Merck, MD  mupirocin  ointment (BACTROBAN ) 2 % APPLY TO AFFECTED AREA 3 TIMES A DAY 03/10/24   Darrol Merck, MD  polyethylene glycol powder (GLYCOLAX /MIRALAX ) 17 GM/SCOOP powder Place 255 g into feeding tube daily as needed for mild constipation. Patient taking differently: Place 8.5-17 g into feeding tube daily as needed for mild constipation (mix as directed). 05/04/21   Cheryle Page, MD  sodium chloride  HYPERTONIC 3 % nebulizer solution Take by  nebulization as needed for other (as needed to loosen cough, congestion,  two or three times a day). 10/18/23   Pokhrel, Vernal, MD  lacosamide  (VIMPAT ) 50 MG TABS tablet Take by mouth. Patient not taking: No sig reported 06/17/18 05/01/21  [provider]    Allergies: Cephalexin , Clobazam , and Penicillins    Review of Systems  All other systems reviewed and are negative.   Updated Vital Signs There were no vitals taken for this visit.  Physical Exam Vitals and nursing note reviewed.  Constitutional:      Appearance: She is well-developed. She is ill-appearing.  HENT:     Head: Atraumatic.  Cardiovascular:     Rate and Rhythm: Regular rhythm. Tachycardia present.     Heart sounds: No murmur heard. Pulmonary:     Comments: Tachypnea, diffuse wheezing Abdominal:     Palpations: Abdomen is soft.     Tenderness: There is no abdominal tenderness. There is no guarding or rebound.  Musculoskeletal:        General: No tenderness.  Skin:    General: Skin is warm and dry.  Neurological:     Comments: Nonverbal.  Does not open eyes.  Pupils equal and reactive bilaterally     (all labs ordered are listed, but only abnormal results are displayed) Labs Reviewed - No data to display  EKG: None  Radiology: No results found.   Procedures   Medications Ordered in the ED - No data to display                                 Medical Decision Making Amount and/or Complexity of Data Reviewed Labs: ordered. Radiology: ordered.  Risk Prescription drug management.   Patient with history of Rett syndrome, oxygen dependence here for evaluation of hypoxia.  She is febrile, tachypneic, tachycardic with wheezing on examination.  She has already been treated with prehospital Rocephin .  Will broaden antibiotics for possible pneumonia.  Will trial heated high flow to see if this helps with respiratory support, treating fever with acetaminophen .  Patient care transferred pending  labs and additional workup.     Final diagnoses:  None    ED Discharge Orders     None          Griselda Norris, MD 10/31/24 [phone]

## 2024-10-31 NOTE — Plan of Care (Signed)
°  Problem: Education: Goal: Knowledge of General Education information will improve Description: Including pain rating scale, medication(s)/side effects and non-pharmacologic comfort measures Outcome: Not Progressing   Problem: Health Behavior/Discharge Planning: Goal: Ability to manage health-related needs will improve Outcome: Not Progressing   Problem: Clinical Measurements: Goal: Ability to maintain clinical measurements within normal limits will improve Outcome: Not Progressing Goal: Will remain free from infection Outcome: Not Progressing Goal: Diagnostic test results will improve Outcome: Not Progressing Goal: Respiratory complications will improve Outcome: Not Progressing Goal: Cardiovascular complication will be avoided Outcome: Not Progressing   Problem: Activity: Goal: Risk for activity intolerance will decrease Outcome: Not Progressing   Problem: Nutrition: Goal: Adequate nutrition will be maintained Outcome: Not Progressing   Problem: Coping: Goal: Level of anxiety will decrease Outcome: Not Progressing   Problem: Elimination: Goal: Will not experience complications related to bowel motility Outcome: Not Progressing Goal: Will not experience complications related to urinary retention Outcome: Not Progressing   Problem: Pain Managment: Goal: General experience of comfort will improve and/or be controlled Outcome: Not Progressing   Problem: Safety: Goal: Ability to remain free from injury will improve Outcome: Not Progressing   Problem: Skin Integrity: Goal: Risk for impaired skin integrity will decrease Outcome: Not Progressing   Problem: Activity: Goal: Ability to tolerate increased activity will improve Outcome: Not Progressing   Problem: Respiratory: Goal: Ability to maintain a clear airway and adequate ventilation will improve Outcome: Not Progressing   Problem: Role Relationship: Goal: Method of communication will improve Outcome: Not  Progressing   Problem: Activity: Goal: Ability to tolerate increased activity will improve Outcome: Not Progressing   Problem: Respiratory: Goal: Ability to maintain a clear airway and adequate ventilation will improve Outcome: Not Progressing   Problem: Role Relationship: Goal: Method of communication will improve Outcome: Not Progressing

## 2024-10-31 NOTE — Consult Note (Signed)
 NEUROLOGY CONSULT NOTE   Date of service: October 31, 2024 Patient Name: Jo Matthews MRN:  [id number] DOB:  [birth year] Chief Complaint: shortness of breath Requesting Provider: Francesco Elsie NOVAK, MD  History of Present Illness  Aubriee Szeto is a 28 y.o. 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 Imaging concerning for pneumonia, fever of 102. Sepsis protocol, vanco/doxycycline  initiated.   Patient has a long seizure history and is on several AED medications. Neurology consulted for evaluation of seizures.   Family (mother, father and sister at bedside) denies any witnessed seizures or seizure-like movements today. They did say that they noticed her staring off more than usual and then she stopped opening her eyes, with gradual worsening lethargy sine 0300 since the alarm went off for her oxygen. She has not had her seizure medications yet today. These have been brought from home and are pending pharmacy to bring them back up for administration.   On exam, she does not open eyes to voice or noxious stimuli. Patient is nonverbal at baseline. She does not follow commands, which family states she only does intermittently at baseline. Family endorses that she can have anywhere from multiple breakthrough seizures a day to none for a week with no contributing factors known. She has been on the medication for years, can only take the name brand and is not able to take the medications from the hospital pharmacy. They have brought in her medicine and it has been taken down to the pharmacy to be dispensed.   Last outpatient neurology appointment was in March 2025, Dr. Yates. In his  notes, he noted increasing Fycompa  to 3mg  and see if this increase is tolerated. Not sure if the dose wasn't tolerated or if the dosage ended up not being changed. At bedside today, mom stated nothing had been changed lately. He does not the patient is able to take BRAND NAME medications only. Recommendations from this visit below:  1.  Continue: Clonazepam  0.25 mg in the a.m. and 0.5 mg in the p.m.  To take Additional doses of 0.25 mg in the afternoon if needed for breakthrough seizures. 2.  Continue: Depakote  125 mg sprinkles p.o. 3 sprinkles in the morning 3 in the afternoon and 4 at night. 3.  Continue: Keppra  100 mg/mL, 20 mL's in the morning and 22.5 mL's at night  4.  Continue: Cannabidiol  3.5 mL's twice daily 5.  Increasae Fycompa   from to 3 mg to be taken at night.  6.. Follow-up in4-66 months' time or sooner as needed.    ROS   Unable to ascertain due to AMS  Past History   Past Medical History:  Diagnosis Date   Fracture, humerus closed    G tube feedings (HCC)    Pneumonia    Recurrent fever of unknown cause 08/31/2012   Fever to 104-105 lasting 24 hrs, occuring once a week for the past 5 weeks (onset Sept 2013)   Rett's syndrome    Scoliosis    Seizure disorder (HCC)    Seizures (HCC)    Weight loss     Past Surgical History:  Procedure Laterality Date   BACK SURGERY     BRONCHIAL WASHINGS  10/16/2023   Procedure: BRONCHIAL WASHINGS;  Surgeon: Neda Jennet LABOR, MD;  Location: [facility];  Service: Endoscopy;;   GASTROSTOMY TUBE PLACEMENT     SPINAL GROWTH RODS     SPINE SURGERY N/A    [employer]rees[employer] 02/14/2021   VIDEO BRONCHOSCOPY Left 10/16/2023   Procedure: VIDEO BRONCHOSCOPY WITHOUT FLUORO;  Surgeon: Neda Jennet LABOR, MD;  Location: [facility];  Service: Endoscopy;  Laterality: Left;  mucus plugging    Family History: Family History  Problem Relation Age of Onset   Heart disease Other    Hyperlipidemia Other    Diabetes Other     Social History   reports that she has never smoked. She has never used smokeless tobacco. She reports that she does not drink alcohol  and does not use drugs.  Allergies[1]  Medications  Current Medications[2]  Vitals   Vitals:   11/08/2024 0643 November 06, 2024 0646 10/29/2024 0648  BP:  109/70   Pulse:  (!) 141   Resp:  (!) 40   Temp:  (!) 102.8 F (39.3 C)   TempSrc:  Rectal   SpO2: 91% 92%   Weight:   45.4 kg  Height:   4' 11 (1.499 m)    Body mass index is 20.22 kg/m.   Physical Exam   Constitutional: Appears chronically ill.  Cardiovascular: ST in 120s with hypotension Respiratory:On [facility] [place]  Neurologic Examination   Patient does not open eyes to voice or pain.  She does not follow commands. She is nonverbal at baseline.  PERRL, Midline gaze, does not track.  [facility] [place] with strap in pace, but face appears symmetric at rest.  No spontaneous movement.  Withdraws slightly to pain in all extremities.   Labs/Imaging/Neurodiagnostic studies   CBC:  Recent Labs  Lab 11/04/2024 0639 Nov 06, 2024 0649 10/26/2024 0650 11/10/2024 0907  WBC 2.3*  --   --   --   NEUTROABS 1.5*  --   --   --   HGB 11.2*   < > 11.9* 9.5*  HCT 36.1   < > 35.0* 28.0*  MCV 104.3*  --   --   --   PLT 176  --   --   --    < > = values in this interval not displayed.   Basic Metabolic Panel:  Lab Results  Component Value Date   NA 135 10/25/2024   K 2.8 (L) 10/22/2024   CO2 36 (H) 11/05/2024   GLUCOSE 147 (H) 11/10/2024   BUN 11 11/03/2024   CREATININE 0.50 11/14/2024   CALCIUM  9.4 10/27/2024   GFRNONAA >60 11/12/2024   GFRAA >60 08/05/2017   Lipid Panel: No results found for: LDLCALC HgbA1c: No results found for: HGBA1C Urine Drug Screen: No results found for: LABOPIA, COCAINSCRNUR, LABBENZ, AMPHETMU, THCU, LABBARB  Alcohol  Level No results found for: [facility] INR  Lab Results  Component Value Date   INR 0.9 November 06, 2024   APTT No results found for: APTT AED levels:  Lab Results  Component  Value Date   LEVETIRACETA 49.7 (H) 04/22/2010    CXR 10/25/2024: Increased dense airspace consolidation in the left mid and both lower lung fields, favoring multilobar pneumonia. Mild cardiomegaly without radiographic evidence of heart failure.  Neurodiagnostics rEEG:  This study is consistent with patient's history of generalized epilepsy. However the frequency of epileptiform discharges  as well as overriding fast activity is on the ictal-interictal continuum with increased risk of seizures. Consider long term monitoring for further evaluation.   Additionally there is  moderate diffuse encephalopathy.  ASSESSMENT  Ettie Krontz is a 28 y.o. 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 Imaging concerning for pneumonia, fever of 102. Sepsis protocol, vanco/doxycycline  initiated.   Patient has a long seizure history and is on multiple seizure medications, with close outpatient management. With this history, an abnormal EEG is expected. However, sepsis and hypoxemia can lower her seizure threshold and cause breakthrough seizures. Family states that it is normal for her to have breakthrough seizures and that it varies from no seizures for days to multiple seizures in one day without any known contributing factors. Confirm that they did not see any seizure-like movement this morning, but that she was more lethargic than usual and was staring off in space, which could be attributed to a seizure.   RECOMMENDATIONS   - Continue Home AEDs. Family has brought these in from home and requests that these are all administered from her home medications, NOT from pharmacy supply.  - Continue LTM  EEG  ______________________________________________________________________   Signed, Rocky JAYSON Likes, NP [facility]    I have seen the patient reviewed the above note.  She has a longstanding a very difficult to control epilepsy, admitted with pneumonia, hypoxemia, fever.  LTM EEG has periodic discharges with overriding fast activity, which are concerning and I would favor using an additional agent to help raise her seizure threshold temporarily.  She never has a normal-appearing EEG, and I would not chase this to the point of suppression, but I do think adding some benzodiazepine could be helpful, especially since she will need sedation while intubated anyway.  I will start Versed  2 mg/h after a 5 mg bolus.    We will continue her home antiepileptics otherwise.   Aisha Seals, MD [facility]   If 7pm- 7am, please page neurology on call as listed in AMION.     [1]  Allergies Allergen Reactions   Cephalexin  Other (See Comments)    Seizures    Clobazam  Shortness Of Breath and Other (See Comments)    Severe side effects = Respiratory distress   Penicillins Shortness Of Breath and Other (See Comments)    Respiratory distress  [2]  Current Facility-Administered Medications:    acetaminophen  (TYLENOL ) tablet 650 mg, 650 mg, Per Tube, Q6H PRN **OR** acetaminophen  (TYLENOL ) suppository 650 mg, 650 mg, Rectal, Q6H PRN, Tobie Gaines, DO   calcium  carbonate (dosed in mg elemental calcium ) suspension 200 mg of elemental calcium , 200 mg of elemental calcium , Per Tube, TID PRN, Tobie Gaines, DO   cannabidiol  (EPIDIOLEX ) 100 MG/ML solution 350 mg, 350 mg, Per Tube, BID, Young, Travis J, DO   clonazepam  (KLONOPIN ) disintegrating tablet 0.25 mg, 0.25 mg, Per Tube, Daily **AND** clonazepam  (KLONOPIN ) disintegrating tablet 0.5 mg, 0.5 mg, Per Tube, QHS, Patel, Amar, DO   diazepam  (DIASTAT ) rectal kit 2.5 mg, 2.5 mg, Rectal, PRN, Francesco Elsie NOVAK, MD   doxycycline   (VIBRAMYCIN ) 100 mg in sodium chloride  0.9 % 250 mL IVPB, 100 mg, Intravenous, Q12H, Patel, Amar, DO   enoxaparin  (LOVENOX ) injection 30 mg, 30 mg, Subcutaneous, Q24H, Patel, Amar, DO   ibuprofen  (ADVIL ) 100 MG/5ML suspension 300 mg, 300 mg, Per Tube, Q4H PRN, Tobie, Amar, DO   ipratropium-albuterol  (DUONEB) 0.5-2.5 (3) MG/3ML nebulizer solution 3 mL, 3 mL, Nebulization, Q6H, Patel, Amar, DO   lactated ringers   infusion, , Intravenous, Continuous, Griselda Norris, MD, Last Rate: 150 mL/hr at 10/31/24 0756, New Bag at 10/31/24 0756   levETIRAcetam  (KEPPRA ) tablet 2,000 mg, 2,000 mg, Per Tube, Daily **AND** levETIRAcetam  (KEPPRA ) tablet 2,250 mg, 2,250 mg, Per Tube, QHS, Young, Travis J, DO   Perampanel  SUSP 4 mL, 4 mL, Per Tube, QHS, Francesco Elsie NOVAK, MD   polyethylene glycol (MIRALAX  / GLYCOLAX ) packet 17 g, 17 g, Oral, Daily PRN, Tobie Gaines, DO   valproic  acid (DEPAKENE ) 250 MG/5ML solution 375 mg, 375 mg, Per Tube, Q breakfast **AND** valproic  acid (DEPAKENE ) 250 MG/5ML solution 375 mg, 375 mg, Per Tube, QPC lunch **AND** valproic  acid (DEPAKENE ) 250 MG/5ML solution 500 mg, 500 mg, Per Tube, QHS, Patel, Amar, DO   vancomycin  (VANCOREADY) IVPB 500 mg/100 mL, 500 mg, Intravenous, Q12H, Winfrey, Elsie NOVAK, MD  Current Outpatient Medications:    albuterol  (PROVENTIL ) (2.5 MG/3ML) 0.083% nebulizer solution, Take 3 mLs (2.5 mg total) by nebulization every 6 (six) hours as needed for wheezing or shortness of breath., Disp: 75 mL, Rfl: 1   Calcium  Carbonate Antacid (CALCIUM  CARBONATE, DOSED IN MG ELEMENTAL CALCIUM ,) 1250 MG/5ML SUSP, Place 2 mLs (200 mg of elemental calcium  total) into feeding tube 3 (three) times daily as needed for indigestion., Disp: 437 mL, Rfl: 0   cannabidiol  (EPIDIOLEX ) 100 MG/ML solution, Place 350 mg into feeding tube in the morning and at bedtime., Disp: , Rfl:    clonazePAM  (KLONOPIN ) 0.25 MG disintegrating tablet, TAKE 1 TAB BY MOUTH IN MORNING & 2 TABS AT NIGHT WITH 15 EXTRA  FOR BREAKTHROUGH SEIZURE FOR 30 DAYS, Disp: 105 tablet, Rfl: 5   diazepam  (DIASAT) 20 MG GEL, Place 12.5 mg rectally as needed (for a seizure lasting more than 5 minutes)., Disp: , Rfl:    divalproex  (DEPAKOTE  SPRINKLE) 125 MG capsule, 375-500 mg See admin instructions. Place 375 mg into the tube in the morning & afternoon and 500 mg at bedtime, Disp: , Rfl:    FYCOMPA  0.5 MG/ML SUSP, Place 2 mg into feeding tube at bedtime., Disp: , Rfl:    guaiFENesin  (ROBITUSSIN CHEST CONGESTION PO), Place 10 mLs into feeding tube daily as needed (cough)., Disp: , Rfl:    ibuprofen  (ADVIL ) 100 MG/5ML suspension, Place 300 mg into feeding tube every 4 (four) hours as needed for mild pain., Disp: , Rfl:    levETIRAcetam  (KEPPRA ) 100 MG/ML solution, Place 2,000-2,250 mg into feeding tube See admin instructions. Place 2,000 mg (20 ml's) into the tube in the morning and 2,250 mg (22.5 ml's) at bedtime, Disp: , Rfl:    levocetirizine (XYZAL ) 2.5 MG/5ML solution, Take 10 mLs (5 mg total) by mouth every evening., Disp: 300 mL, Rfl: 12   Multiple Vitamin (MULTIVITAMIN PO), Give 1 tablet by tube daily., Disp: , Rfl:    mupirocin  ointment (BACTROBAN ) 2 %, APPLY TO AFFECTED AREA 3 TIMES A DAY (Patient taking differently: Apply 1 Application topically 2 (two) times daily as needed.), Disp: 22 g, Rfl: 12   polyethylene glycol powder (GLYCOLAX /MIRALAX ) 17 GM/SCOOP powder, Place 255 g into feeding tube daily as needed for mild constipation. (Patient taking differently: Place 8.5-17 g into feeding tube daily as needed for mild constipation (mix as directed).), Disp: , Rfl:    sodium chloride  HYPERTONIC 3 % nebulizer solution, Take by nebulization as needed for other (as needed to loosen cough, congestion,  two or three times a day)., Disp: 750 mL, Rfl: 1

## 2024-10-31 NOTE — Hospital Course (Signed)
 28 year old  Rett syndrome  Nnon verbal   G tube depened Fevers yesterdat  2L at baseline   EMS said she was in the 80s,  Put her on 6 lietrs  Febrile  High flow nasal cannula  Still tachypnea   Rocephin  by EMS

## 2024-10-31 NOTE — Procedures (Signed)
 Patient Name: Jo Matthews  MRN: [id number]  Epilepsy Attending: Arlin MALVA Krebs  Referring Physician/Provider: Tobie Gaines, DO  Date: 10/31/2024 Duration: 22.18 mins  Patient history: 28yo F patient with history of Rett syndrome, seizures, oxygen dependence here for evaluation of hypoxia. EEG to evaluate for seizure  Level of alertness: Awake  AEDs during EEG study: CBD, Clonazepam , VPA, LEV, Perampanel   Technical aspects: This EEG study was done with scalp electrodes positioned according to the 10-20 International system of electrode placement. Electrical activity was reviewed with band pass filter of 1-70Hz , sensitivity of 7 uV/mm, display speed of 59mm/sec with a 60Hz  notched filter applied as appropriate. EEG data were recorded continuously and digitally stored.  Video monitoring was available and reviewed as appropriate.  Description: EEG showed continuous generalized 3 to 6 Hz theta-delta slowing. Generalized periodic epileptiform discharges with overriding fast activity were noted at 1-2Hz . Hyperventilation and photic stimulation were not performed.     ABNORMALITY - Periodic discharges with overriding fast activity, generalized ( GPEDs) - Continuous slow, generalized  IMPRESSION: This study is consistent with patient's history of generalized epilepsy. However the frequency of epileptiform discharges  as well as overriding fast activity is on the ictal-interictal continuum with increased risk of seizures. Consider long term monitoring for further evaluation.  Additionally there is  moderate diffuse encephalopathy.  Jo Matthews O Dontrelle Mazon

## 2024-10-31 NOTE — Procedures (Signed)
 Intubation Procedure Note  Jo Matthews  [id number]  December 02, 1995  Date:10/31/2024  Time:12:53 PM   Provider Performing:Raynah Gomes B Karley Pho    Procedure: Intubation (31500)  Indication(s) Respiratory Failure  Consent Risks of the procedure as well as the alternatives and risks of each were explained to the patient and/or caregiver.  Consent for the procedure was obtained and is signed in the bedside chart   Anesthesia Etomidate , Versed , Fentanyl , and Rocuronium    Time Out Verified patient identification, verified procedure, site/side was marked, verified correct patient position, special equipment/implants available, medications/allergies/relevant history reviewed, required imaging and test results available.   Sterile Technique Usual hand hygeine, masks, and gloves were used   Procedure Description Patient positioned in bed supine.  Sedation given as noted above.  Patient was intubated with endotracheal tube using Glidescope.  View was Grade 1 full glottis .  Number of attempts was 1.  Colorimetric CO2 detector was consistent with tracheal placement.   Complications/Tolerance None; patient tolerated the procedure well. Chest X-ray is ordered to verify placement.   EBL Minimal   Specimen(s) None

## 2024-10-31 NOTE — Progress Notes (Addendum)
 1230 patient arrived from ER with parents at bedside on non re-breather chg and ADLs care provided patient non verbal at base line patient had on diaper it was wet from urine it was removed per protocol.  1300 patient intubated  1315 patient had BM  1336 seizure meds given as soon as peg tube tubing came up from ER, father accidentally dropped seizure meds they were wasted and more given 1451 versed  IV started 1516 levo started for hypotension and bolus LR 500 ml given 1830 patient bladder scanned no urine since coming to unit bladder scanner read 8 ml and 9 ml. Will continue to monitor output

## 2024-10-31 NOTE — Sepsis Progress Note (Signed)
 Notified bedside nurse of need to draw repeat lactic acid since the second lactic acid was higher than the first.

## 2024-10-31 NOTE — Progress Notes (Signed)
 LTM EEG moved from ER to 41m study is hooked up and running - push button tested - [facility] monitoring.

## 2024-10-31 NOTE — ED Triage Notes (Signed)
 Patient presents to ed via [facility] from home Mother states child went to his   dad's on fri came home yest with diarrhea  and fever. Mother states child is less resp. Than her norm. Is normal for to be non verbal, she is wheelchair bound, normally on 2 liters [place]. Last pm her sats were in the 80's upon ems arrival placed on NRB sats increased tp 90's, patient had a temp of 101 ax with ems and was given Rocephin  1 gm  per ems

## 2024-11-01 ENCOUNTER — Inpatient Hospital Stay (HOSPITAL_COMMUNITY)

## 2024-11-01 DIAGNOSIS — E871 Hypo-osmolality and hyponatremia: Secondary | ICD-10-CM

## 2024-11-01 DIAGNOSIS — N179 Acute kidney failure, unspecified: Secondary | ICD-10-CM

## 2024-11-01 LAB — CBC WITH DIFFERENTIAL/PLATELET
Abs Immature Granulocytes: 3.4 K/uL — ABNORMAL HIGH (ref 0.00–0.07)
Band Neutrophils: 27 %
Basophils Absolute: 0 K/uL (ref 0.0–0.1)
Basophils Relative: 0 %
Eosinophils Absolute: 0 K/uL (ref 0.0–0.5)
Eosinophils Relative: 0 %
HCT: 27.1 % — ABNORMAL LOW (ref 36.0–46.0)
Hemoglobin: 8.6 g/dL — ABNORMAL LOW (ref 12.0–15.0)
Lymphocytes Relative: 19 %
Lymphs Abs: 2.5 K/uL (ref 0.7–4.0)
MCH: 33.3 pg (ref 26.0–34.0)
MCHC: 31.7 g/dL (ref 30.0–36.0)
MCV: 105 fL — ABNORMAL HIGH (ref 80.0–100.0)
Metamyelocytes Relative: 15 %
Monocytes Absolute: 0.4 K/uL (ref 0.1–1.0)
Monocytes Relative: 3 %
Myelocytes: 11 %
Neutro Abs: 6.9 K/uL (ref 1.7–7.7)
Neutrophils Relative %: 25 %
Platelets: 139 K/uL — ABNORMAL LOW (ref 150–400)
RBC: 2.58 MIL/uL — ABNORMAL LOW (ref 3.87–5.11)
RDW: 13.7 % (ref 11.5–15.5)
WBC: 13.2 K/uL — ABNORMAL HIGH (ref 4.0–10.5)
nRBC: 0 % (ref 0.0–0.2)

## 2024-11-01 LAB — COMPREHENSIVE METABOLIC PANEL WITH GFR
ALT: 21 U/L (ref 0–44)
AST: 51 U/L — ABNORMAL HIGH (ref 15–41)
Albumin: 1.9 g/dL — ABNORMAL LOW (ref 3.5–5.0)
Alkaline Phosphatase: 41 U/L (ref 38–126)
Anion gap: 12 (ref 5–15)
BUN: 18 mg/dL (ref 6–20)
CO2: 26 mmol/L (ref 22–32)
Calcium: 8.3 mg/dL — ABNORMAL LOW (ref 8.9–10.3)
Chloride: 96 mmol/L — ABNORMAL LOW (ref 98–111)
Creatinine, Ser: 1.05 mg/dL — ABNORMAL HIGH (ref 0.44–1.00)
GFR, Estimated: >60 mL/min (ref >60)
Glucose, Bld: 127 mg/dL — ABNORMAL HIGH (ref 70–99)
Potassium: 3.7 mmol/L (ref 3.5–5.1)
Sodium: 134 mmol/L — ABNORMAL LOW (ref 135–145)
Total Bilirubin: 0.8 mg/dL (ref 0.0–1.2)
Total Protein: 5 g/dL — ABNORMAL LOW (ref 6.5–8.1)

## 2024-11-01 LAB — GLUCOSE, CAPILLARY
Glucose-Capillary: 113 mg/dL — ABNORMAL HIGH (ref 70–99)
Glucose-Capillary: 124 mg/dL — ABNORMAL HIGH (ref 70–99)
Glucose-Capillary: 199 mg/dL — ABNORMAL HIGH (ref 70–99)
Glucose-Capillary: 215 mg/dL — ABNORMAL HIGH (ref 70–99)
Glucose-Capillary: 125 mg/dL — ABNORMAL HIGH (ref 70–99)
Glucose-Capillary: 97 mg/dL (ref 70–99)

## 2024-11-01 LAB — URINALYSIS, W/ REFLEX TO CULTURE (INFECTION SUSPECTED)
Bacteria, UA: NONE SEEN
Bilirubin Urine: NEGATIVE
Glucose, UA: NEGATIVE mg/dL
Ketones, ur: NEGATIVE mg/dL
Leukocytes,Ua: NEGATIVE
Nitrite: NEGATIVE
Protein, ur: 30 mg/dL — AB
Specific Gravity, Urine: 1.009 (ref 1.005–1.030)
pH: 6 (ref 5.0–8.0)

## 2024-11-01 LAB — PHOSPHORUS: Phosphorus: 1.8 mg/dL — ABNORMAL LOW (ref 2.5–4.6)

## 2024-11-01 LAB — MAGNESIUM: Magnesium: 1.3 mg/dL — ABNORMAL LOW (ref 1.7–2.4)

## 2024-11-01 LAB — STREP PNEUMONIAE URINARY ANTIGEN: Strep Pneumo Urinary Antigen: NEGATIVE

## 2024-11-01 LAB — MRSA NEXT GEN BY PCR, NASAL: MRSA by PCR Next Gen: NOT DETECTED

## 2024-11-01 MED ORDER — MIDAZOLAM BOLUS VIA INFUSION: 0.0000 mg | INTRAVENOUS | Status: DC | PRN

## 2024-11-01 MED ORDER — FENTANYL BOLUS VIA INFUSION: 25.0000 ug | INTRAVENOUS | Status: DC | PRN

## 2024-11-01 MED ORDER — MIDAZOLAM-SODIUM CHLORIDE 100-0.9 MG/100ML-% IV SOLN
0.0000 mg/h | INTRAVENOUS | Status: DC
  Administered 2024-11-02: 05:00:00 2 mg/h via INTRAVENOUS
  Filled 2024-11-01: qty 100

## 2024-11-01 MED ORDER — FENTANYL CITRATE (PF) 50 MCG/ML IJ SOSY
25.0000 ug | PREFILLED_SYRINGE | Freq: Once | INTRAMUSCULAR | Status: DC
  Filled 2024-11-01: qty 1

## 2024-11-01 MED ORDER — POTASSIUM PHOSPHATES 15 MMOLE/5ML IV SOLN
30.0000 mmol | Freq: Once | INTRAVENOUS | Status: AC
  Administered 2024-11-01: 11:00:00 30 mmol via INTRAVENOUS
  Filled 2024-11-01: qty 10

## 2024-11-01 MED ORDER — FENTANYL 2500MCG IN NS 250ML (10MCG/ML) PREMIX INFUSION: 0.0000 ug/h | INTRAVENOUS | Status: DC

## 2024-11-01 MED ORDER — OSMOLITE 1.2 CAL PO LIQD
1000.0000 mL | ORAL | Status: DC
  Administered 2024-11-01 – 2024-11-05 (×6): 1000 mL
  Filled 2024-11-01 (×7): qty 1000

## 2024-11-01 MED ORDER — MAGNESIUM SULFATE 4 GM/100ML IV SOLN
4.0000 g | Freq: Once | INTRAVENOUS | Status: AC
  Administered 2024-11-01: 07:00:00 4 g via INTRAVENOUS
  Filled 2024-11-01: qty 100

## 2024-11-01 MED ORDER — THIAMINE MONONITRATE 100 MG PO TABS
100.0000 mg | ORAL_TABLET | Freq: Every day | ORAL | Status: DC
  Administered 2024-11-01 – 2024-11-06 (×6): 100 mg
  Filled 2024-11-01 (×6): qty 1

## 2024-11-01 MED ORDER — VANCOMYCIN HCL 750 MG/150ML IV SOLN: 750.0000 mg | INTRAVENOUS | Status: DC

## 2024-11-01 MED ORDER — HYDROCORTISONE SOD SUC (PF) 100 MG IJ SOLR
100.0000 mg | Freq: Two times a day (BID) | INTRAMUSCULAR | Status: DC
  Administered 2024-11-01 – 2024-11-03 (×5): 100 mg via INTRAVENOUS
  Filled 2024-11-01 (×5): qty 2

## 2024-11-01 MED ORDER — IPRATROPIUM-ALBUTEROL 0.5-2.5 (3) MG/3ML IN SOLN: 3.0000 mL | Freq: Four times a day (QID) | RESPIRATORY_TRACT | Status: DC | PRN

## 2024-11-01 NOTE — Progress Notes (Addendum)
 Initial Nutrition Assessment  DOCUMENTATION CODES:  Not applicable  INTERVENTION:  Continue tube feeding via PEG: Osmolite 1.2 at 40 ml/h (960 ml per day) Start at 20 and advance by 10mL every 12 hours to reach goal Provides 1152 kcal, 53 gm protein, 787 ml free water  daily Thiamine  100mg  x 7 days for electrolyte abnormalities when starting TF When pt more stable and ready to transition back to home regimen, recommend the following: Osmolite 1.5, 1 carton 3x/d Flush with 50mL before and after each administration (100 3x/d) + an additional 2x/d This provides 1065kcal, 45g protein, and free water   (TF+flush = 1020mL/d)  NUTRITION DIAGNOSIS:  Increased nutrient needs related to acute illness as evidenced by estimated needs.  GOAL:  Patient will meet greater than or equal to 90% of their needs  MONITOR:  TF tolerance, I & O's, Vent status, Labs  REASON FOR ASSESSMENT:  Consult Enteral/tube feeding initiation and management  ASSESSMENT:  Pt with hx of Rett's syndrome (wheelchair bound and with g-tube), seizure disorder, and scoliosis presented to ED with worsening respiratory status.  12/15 - presented to ED, intubated   Patient is currently intubated on ventilator support. Parents at bedside able to provide a history.   At baseline, pt non-ambulatory and non-verbal. Parents report that pt continues to take a small amount in PO but most nutrition is via g-tube. Home regimen is 3 bottles of Osmolite 1.5/d (1065kcal, 45g protein, and free water ). Also gets 2 boluses of free water  (240mL each) and 60mL of free water  with meds TID (~1L free water  daily TF+flush).  Discussed that while pt acutely ill on the vent, would have pt on continuous feeds. Once more stable and back to baseline, will adjust back to home regimen as pt has been stable on this for quite some time. Weight stable and appears well nourished on exam. Will utilize Osmolite 1.2 for easy 24 hours  administration and hydration as pt will not be getting his typical flushes at this time. Parents in agreement.  MV: 9.1 L/min Temp (24hrs), Avg:99 F (37.2 C), Min:97.6 F (36.4 C), Max:101.4 F (38.6 C) MAP (cuff): 70-31mmHg  Admit weight: 45.4 kg   Current weight: 49.6 kg    Intake/Output Summary (Last 24 hours) at 11/01/2024 1333 Last data filed at 11/01/2024 0804 Gross per 24 hour  Intake 1062.01 ml  Output 250 ml  Net 812.01 ml  Net IO Since Admission: 2,458.21 mL [11/01/24 1333]  Drains/Lines: PEG LUQ OGT 16 Fr. (Distal stomach) UOP x 24 hours  Nutritionally Relevant Medications: Scheduled Meds:  divalproex   500 mg Oral QHS   famotidine   20 mg Per Tube BID   hydrocortisone  sod succinate (SOLU-CORTEF )  100 mg Intravenous Q12H   levETIRAcetam   2,000 mg Per Tube Daily   Perampanel   4 mL Per Tube QHS   Continuous Infusions:  cefTRIAXone  (ROCEPHIN )  IV 2 g (11/01/24 0911)   doxycycline  (VIBRAMYCIN ) IV 100 mg (11/01/24 0949)   feeding supplement (OSMOLITE 1.5 CAL) 10 mL/hr at 11/01/24 0804   midazolam      potassium PHOSPHATE  IVPB (in mmol)     [START ON 11/02/2024] vancomycin      PRN Meds: calcium  carbonate, polyethylene glycol  Labs Reviewed: Sodium 134, chloride 96 Creatinine 1.05 Potassium 3.7 (WNL but 12% drop since starting TF) Phosphorus 1.8 (5% drop since starting TF) Magnesium  1.3 (24% drop since starting TF) CBG ranges from 113-148 mg/dL over the last 24 hours  NUTRITION - FOCUSED PHYSICAL EXAM:  Flowsheet Row Most Recent Value  Orbital Region No depletion  Upper Arm Region No depletion  Thoracic and Lumbar Region No depletion  Buccal Region No depletion  Temple Region No depletion  Clavicle Bone Region No depletion  Clavicle and Acromion Bone Region No depletion  Scapular Bone Region No depletion  Dorsal Hand No depletion  Patellar Region No depletion  Anterior Thigh Region No depletion  Posterior Calf Region No depletion  Edema (RD  Assessment) Moderate  [BLE and hands]  Hair Reviewed  Eyes Reviewed  Mouth Reviewed  Skin Reviewed  Nails Reviewed    Diet Order:   Diet Order             Diet NPO time specified  Diet effective now                   EDUCATION NEEDS:  Education needs have been addressed  Skin:  Skin Assessment: Reviewed RN Assessment  Last BM:  12/16 - type 7  Height:  Ht Readings from Last 1 Encounters:  10/31/24 4' 11 (1.499 m)    Weight:  Wt Readings from Last 1 Encounters:  11/01/24 49.6 kg    Ideal Body Weight:  44.7 kg  BMI:  Body mass index is 22.09 kg/m.  Estimated Nutritional Needs:  Kcal:  1100-1300 kcal/d Protein:  55-70 Fluid:  1.2L/d    Vernell Lukes, RD, LDN, CNSC Registered Dietitian II Please reach out via secure chat

## 2024-11-01 NOTE — Plan of Care (Signed)
°  Problem: Clinical Measurements: Goal: Ability to maintain clinical measurements within normal limits will improve Outcome: Progressing Goal: Respiratory complications will improve Outcome: Progressing Goal: Cardiovascular complication will be avoided Outcome: Progressing   Problem: Nutrition: Goal: Adequate nutrition will be maintained Outcome: Progressing   Problem: Education: Goal: Knowledge of General Education information will improve Description: Including pain rating scale, medication(s)/side effects and non-pharmacologic comfort measures Outcome: Not Progressing

## 2024-11-01 NOTE — Progress Notes (Signed)
 eLink Physician-Brief Progress Note Patient Name: Ema Hebner DOB: [birth year] MRN: [id number]   Date of Service  11/01/2024  HPI/Events of Note    eICU Interventions  Hypokalemia, hypophos, hypomag  -repleted      Intervention Category Intermediate Interventions: Electrolyte abnormality - evaluation and management  Darien Mignogna V. Shineka Auble 11/01/2024, 6:13 AM

## 2024-11-01 NOTE — Plan of Care (Signed)
?  Problem: Education: ?Goal: Knowledge of General Education information will improve ?Description: Including pain rating scale, medication(s)/side effects and non-pharmacologic comfort measures ?Outcome: Progressing ?  ?Problem: Clinical Measurements: ?Goal: Ability to maintain clinical measurements within normal limits will improve ?Outcome: Progressing ?Goal: Will remain free from infection ?Outcome: Progressing ?Goal: Diagnostic test results will improve ?Outcome: Progressing ?Goal: Respiratory complications will improve ?Outcome: Progressing ?Goal: Cardiovascular complication will be avoided ?Outcome: Progressing ?  ?Problem: Nutrition: ?Goal: Adequate nutrition will be maintained ?Outcome: Progressing ?  ?Problem: Safety: ?Goal: Ability to remain free from injury will improve ?Outcome: Progressing ?  ?Problem: Skin Integrity: ?Goal: Risk for impaired skin integrity will decrease ?Outcome: Progressing ?  ?

## 2024-11-01 NOTE — Procedures (Signed)
 Patient Name: Ivanka Kirshner  MRN: [id number]  Epilepsy Attending: Arlin MALVA Krebs  Referring Physician/Provider: Krebs Arlin MALVA, MD  Duration: 10/31/2024 1005 to 11/01/2024 1005   Patient history: 28yo F patient with history of Rett syndrome, seizures, oxygen dependence here for evaluation of hypoxia. EEG to evaluate for seizure   Level of alertness: comatose   AEDs during EEG study: CBD, Clonazepam , VPA, LEV, Perampanel    Technical aspects: This EEG study was done with scalp electrodes positioned according to the 10-20 International system of electrode placement. Electrical activity was reviewed with band pass filter of 1-70Hz , sensitivity of 7 uV/mm, display speed of 68mm/sec with a 60Hz  notched filter applied as appropriate. EEG data were recorded continuously and digitally stored.  Video monitoring was available and reviewed as appropriate.   Description: EEG showed continuous generalized 3 to 6 Hz theta-delta slowing. Generalized periodic epileptiform discharges with overriding fast activity and rhythmicity were noted at 1-2.5Hz , lasting 3-12 seconds alternating with 1-2  seconds of generalized suppression. Hyperventilation and photic stimulation were not performed.      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 However the frequency of epileptiform discharges  as well as overriding fast activity is on the ictal-interictal continuum with increased risk of seizures.    Additionally there is  moderate diffuse encephalopathy.   Raha Tennison O Jasimine Simms

## 2024-11-01 NOTE — Progress Notes (Signed)
 Subjective: Parents at bedside.  States she has had some episodes of eye-opening/flickering concerning for seizures but has not had her typical stiffening episodes.   ROS: Unable to obtain due to poor mental status  Examination  Vital signs in last 24 hours: Temp:  [97.6 F (36.4 C)-101.4 F (38.6 C)] 97.6 F (36.4 C) (12/16 1137) Pulse Rate:  [107-134] 107 (12/16 1200) Resp:  [16-33] 26 (12/16 1200) BP: (78-149)/(52-91) 96/75 (12/16 1200) SpO2:  [89 %-100 %] 95 % (12/16 1200) FiO2 (%):  [40 %-100 %] 60 % (12/16 1200) Weight:  [47.9 kg-49.6 kg] 49.6 kg (12/16 0500)  General: lying in bed, not in apparent distress, intubated Neuro: Comatose, does not open eyes to noxious stimuli, does not follow commands, pupils equally round and reactive to light, corneal reflex absent, cough reflex absent, does not withdraw to noxious stimuli  Basic Metabolic Panel: Recent Labs  Lab 10/31/24 0639 10/31/24 0649 10/31/24 0650 10/31/24 0907 10/31/24 1355 10/31/24 1412 11/01/24 0256  NA 138 137 136 135 135  --  134*  K 3.2* 2.9* 2.8* 2.8* 4.2  --  3.7  CL 90* 88*  --   --   --   --  96*  CO2 36*  --   --   --   --   --  26  GLUCOSE 142* 147*  --   --   --   --  127*  BUN 9 11  --   --   --   --  18  CREATININE 0.42* 0.50  --   --   --   --  1.05*  CALCIUM  9.4  --   --   --   --   --  8.3*  MG  --   --   --   --   --  1.7 1.3*  PHOS  --   --   --   --   --  1.9* 1.8*    CBC: Recent Labs  Lab 10/31/24 0639 10/31/24 0649 10/31/24 0650 10/31/24 0907 10/31/24 1355 11/01/24 0256  WBC 2.3*  --   --   --   --  13.2*  NEUTROABS 1.5*  --   --   --   --  6.9  HGB 11.2* 12.6 11.9* 9.5* 8.5* 8.6*  HCT 36.1 37.0 35.0* 28.0* 25.0* 27.1*  MCV 104.3*  --   --   --   --  105.0*  PLT 176  --   --   --   --  139*     Coagulation Studies: Recent Labs    10/31/24 0639  LABPROT 13.1  INR 0.9    Imaging No new brain imaging overnight    ASSESSMENT AND PLAN: 28 year old female with  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  Admitted in ICU due to concern for pneumonia, currently on antibiotics.  Routine EEG was done which showed generalized epileptiform discharges and therefore she was started on long-term EEG.  Intractable epilepsy - Patient has baseline intractable epilepsy with frequent seizures sometimes on a daily basis.  Family states she does not respond well to any changes in antiseizure medications.  Therefore for now I agree with family to  continue her home dose of antiseizure medications -Okay to DC Versed  from neurology standpoint - Defer further management of comorbidities per primary team - Holding on LTM EEG overnight but will most likely DC tomorrow as long as stable - Discussed plan with family at bedside, ICU team via secure chat    I personally spent a total of 38 minutes in the care of the patient today including getting/reviewing separately obtained history, performing a medically appropriate exam/evaluation, counseling and educating, placing orders, referring and communicating with other health care professionals, documenting clinical information in the EHR, independently interpreting results, and coordinating care.          Arlin Krebs Epilepsy [facility] For questions after 5pm please refer to AMION to reach the Neurologist on call

## 2024-11-01 NOTE — Progress Notes (Addendum)
 NAME:  Jo Matthews, MRN:  [id number], DOB:  Sep 22, 1996, LOS: 1 ADMISSION DATE:  10/31/2024, CONSULTATION DATE: 12/15 REFERRING MD: Dr. Tobie IM TS, CHIEF COMPLAINT: Shortness of breath  History of Present Illness:  28 year old female with past medical history significant for Rett syndrome, wheelchair/bedbound, nonverbal at baseline, chronic respiratory failure with hypoxia on 2 L, PEG tube dependent, and seizure history on multiple AEDs.  She was in her usual state of health until 12/14 when she had 1 episode of hypoxia (wears pulse ox at home).  This recovered spontaneously and she was again doing fine until 1215 early a.m. when she was increasingly lethargic as well as hypoxic with sats in the 70s and 80s.  Also had new onset fever.  Imaging with concern for pneumonia and she was admitted to the internal medicine teaching service for management thereof.  Vancomycin  and doxycycline  were initiated.  While still in the ED she became increasingly hypoxic requiring up titration of heated high flow nasal cannula to 80% FiO2 and 40 L/min.  Despite this she remained in the high 80s to low 90s and PCCM was asked to evaluate for ICU care.  Pertinent  Medical History   has a past medical history of Fracture, humerus closed, G tube feedings (HCC), Pneumonia, Recurrent fever of unknown cause (08/31/2012), Rett's syndrome, Scoliosis, Seizure disorder (HCC), Seizures (HCC), and Weight loss.   Significant Hospital Events: Including procedures, antibiotic start and stop dates in addition to other pertinent events   12/15 admit to ICU for respiratory failure, intubated in the ICU 11/01/2024 remains mechanically intubated,  Interim History / Subjective:  Unresponsive, no complaints, appears puffy on exam.  Objective    Blood pressure 91/61, pulse (!) 114, temperature 97.9 F (36.6 C), temperature source Axillary, resp. rate (!) 27, height 4' 11 (1.499 m), weight 49.6 kg, SpO2 97%.    Vent Mode: PRVC FiO2  (%):  [40 %-100 %] 40 % Set Rate:  [26 bmp] 26 bmp Vt Set:  [320 mL] 320 mL PEEP:  [5 cmH20-8 cmH20] 5 cmH20 Plateau Pressure:  [28 cmH20-30 cmH20] 28 cmH20   Intake/Output Summary (Last 24 hours) at 11/01/2024 9077 Last data filed at 11/01/2024 0804 Gross per 24 hour  Intake 2708.21 ml  Output 250 ml  Net 2458.21 ml   Filed Weights   10/31/24 0648 10/31/24 1230 11/01/24 0500  Weight: 45.4 kg 47.9 kg 49.6 kg    Examination: General: Young female with Rett syndrome, contractures HENT: Normocephalic/atraumatic Lungs: Diminished breath sounds, Cardiovascular: Tachycardic, regular Abdomen: Soft, nontender, nondistended Extremities: Warm, diffuse edema Neuro: Unresponsive at this time, difficult to assess neuroexam   Resolved problem list   Assessment and Plan   Neuro # Rett syndrome # Epilepsy with breakthrough seizures in the setting of infection - Currently on cEEG - Continue AEDs including Depakote , clonazepam , cannabidiol , Keppra , perampanel  - Appreciate neurology recommendations - Sedation with versed  and fent    Pulm # Acute hypoxic respiratory failure -currently intubated # Community-acquired pneumonia Bedside ultrasound with no effusions bilaterally Noted to have elevated peak pressures on the vent and tachypneic.  FiO2 bumped to 60% Noted to have purulent endotracheal secretions on suctioning  -Full vent support -LTVV, 4-8cc/kg IBW with goal Pplat<30 and DP<15 - Daily evaluation for SBT and extubation - Daily evaluation for sedation weaning trials    Cardiac/Vascular  #Hypotension Likely sedation related  GI Diet: Start tube feeds GI PPX: Continue Pepcid   ID Community-acquired pneumonia Sepsis -Mini RVP negative, blood cultures pending  Renal  #Acute kidney injury likely in the setting of sepsis # Mild hyponatremia - Monitor kidney function, adequately resuscitated currently +2.3 L  Endo CBG within goal  Heme/Onc DVT ppx : Lovenox  30  mg   MSK/other  No issues Chronic contractures       Labs   CBC: Recent Labs  Lab 10/31/24 0639 10/31/24 0649 10/31/24 0650 10/31/24 0907 10/31/24 1355 11/01/24 0256  WBC 2.3*  --   --   --   --  13.2*  NEUTROABS 1.5*  --   --   --   --  6.9  HGB 11.2* 12.6 11.9* 9.5* 8.5* 8.6*  HCT 36.1 37.0 35.0* 28.0* 25.0* 27.1*  MCV 104.3*  --   --   --   --  105.0*  PLT 176  --   --   --   --  139*    Basic Metabolic Panel: Recent Labs  Lab 10/31/24 0639 10/31/24 0649 10/31/24 0650 10/31/24 0907 10/31/24 1355 10/31/24 1412 11/01/24 0256  NA 138 137 136 135 135  --  134*  K 3.2* 2.9* 2.8* 2.8* 4.2  --  3.7  CL 90* 88*  --   --   --   --  96*  CO2 36*  --   --   --   --   --  26  GLUCOSE 142* 147*  --   --   --   --  127*  BUN 9 11  --   --   --   --  18  CREATININE 0.42* 0.50  --   --   --   --  1.05*  CALCIUM  9.4  --   --   --   --   --  8.3*  MG  --   --   --   --   --  1.7 1.3*  PHOS  --   --   --   --   --  1.9* 1.8*   GFR: Estimated Creatinine Clearance: 54.4 mL/min (A) (by C-G formula based on SCr of 1.05 mg/dL (H)). Recent Labs  Lab 10/31/24 0639 10/31/24 0649 10/31/24 0907 11/01/24 0256  WBC 2.3*  --   --  13.2*  LATICACIDVEN  --  1.6 2.2*  --     Liver Function Tests: Recent Labs  Lab 10/31/24 0639 11/01/24 0256  AST 38 51*  ALT 19 21  ALKPHOS 60 41  BILITOT 0.7 0.8  PROT 6.6 5.0*  ALBUMIN 2.7* 1.9*   No results for input(s): LIPASE, AMYLASE in the last 168 hours. No results for input(s): AMMONIA in the last 168 hours.  ABG    Component Value Date/Time   PHART 7.397 10/31/2024 1355   PCO2ART 58.9 (H) 10/31/2024 1355   PO2ART 185 (H) 10/31/2024 1355   HCO3 36.2 (H) 10/31/2024 1355   TCO2 38 (H) 10/31/2024 1355   O2SAT 100 10/31/2024 1355     Coagulation Profile: Recent Labs  Lab 10/31/24 0639  INR 0.9    Cardiac Enzymes: No results for input(s): CKTOTAL, CKMB, CKMBINDEX, TROPONINI in the last 168  hours.  HbA1C: No results found for: HGBA1C  CBG: Recent Labs  Lab 10/31/24 1753 10/31/24 1939 10/31/24 2320 11/01/24 0312 11/01/24 0733  GLUCAP 141* 145* 148* 113* 124*    Review of Systems:   Patient is encephalopathic and/or intubated; therefore, history has been obtained from chart review.   Past Medical History:  She,  has a past medical history of  Fracture, humerus closed, G tube feedings (HCC), Pneumonia, Recurrent fever of unknown cause (08/31/2012), Rett's syndrome, Scoliosis, Seizure disorder (HCC), Seizures (HCC), and Weight loss.   Surgical History:   Past Surgical History:  Procedure Laterality Date   BACK SURGERY     BRONCHIAL WASHINGS  10/16/2023   Procedure: BRONCHIAL WASHINGS;  Surgeon: Neda Jennet LABOR, MD;  Location: [facility];  Service: Endoscopy;;   GASTROSTOMY TUBE PLACEMENT     SPINAL GROWTH RODS     SPINE SURGERY N/A    [employer] 02/14/2021   VIDEO BRONCHOSCOPY Left 10/16/2023   Procedure: VIDEO BRONCHOSCOPY WITHOUT FLUORO;  Surgeon: Neda Jennet LABOR, MD;  Location: [facility];  Service: Endoscopy;  Laterality: Left;  mucus plugging     Social History:   reports that she has never smoked. She has never used smokeless tobacco. She reports that she does not drink alcohol  and does not use drugs.   Family History:  Her family history includes Diabetes in an other family member; Heart disease in an other family member; Hyperlipidemia in an other family member.   Allergies Allergies[1]   Home Medications  Prior to Admission medications  Medication Sig Start Date End Date Taking? Authorizing Provider  albuterol  (PROVENTIL ) (2.5 MG/3ML) 0.083% nebulizer solution Take 3 mLs (2.5 mg total) by nebulization every 6 (six) hours as needed for wheezing or shortness of breath. 03/29/24  Yes Ramgoolam, Gustav, MD  Calcium  Carbonate Antacid (CALCIUM  CARBONATE, DOSED IN MG ELEMENTAL CALCIUM ,) 1250 MG/5ML SUSP Place 2 mLs (200 mg of elemental calcium   total) into feeding tube 3 (three) times daily as needed for indigestion. 05/04/21  Yes Cheryle Page, MD  cannabidiol  (EPIDIOLEX ) 100 MG/ML solution Place 350 mg into feeding tube in the morning and at bedtime. 03/11/21  Yes [provider]  clonazePAM  (KLONOPIN ) 0.25 MG disintegrating tablet TAKE 1 TAB BY MOUTH IN MORNING & 2 TABS AT NIGHT WITH 15 EXTRA FOR BREAKTHROUGH SEIZURE FOR 30 DAYS 06/03/24 10/31/24 Yes Ramgoolam, Andres, MD  diazepam  (DIASAT) 20 MG GEL Place 12.5 mg rectally as needed (for a seizure lasting more than 5 minutes). 09/18/21  Yes [provider]  divalproex  (DEPAKOTE  SPRINKLE) 125 MG capsule 375-500 mg See admin instructions. Place 375 mg into the tube in the morning & afternoon and 500 mg at bedtime   Yes [provider]  FYCOMPA  0.5 MG/ML SUSP Place 2 mg into feeding tube at bedtime.   Yes [provider]  guaiFENesin  (ROBITUSSIN CHEST CONGESTION PO) Place 10 mLs into feeding tube daily as needed (cough).   Yes [provider]  ibuprofen  (ADVIL ) 100 MG/5ML suspension Place 300 mg into feeding tube every 4 (four) hours as needed for mild pain.   Yes [provider]  levETIRAcetam  (KEPPRA ) 100 MG/ML solution Place 2,000-2,250 mg into feeding tube See admin instructions. Place 2,000 mg (20 ml's) into the tube in the morning and 2,250 mg (22.5 ml's) at bedtime 01/08/18  Yes [provider]  levocetirizine (XYZAL ) 2.5 MG/5ML solution Take 10 mLs (5 mg total) by mouth every evening. 03/10/24 10/31/24 Yes Ramgoolam, Gustav, MD  Multiple Vitamin (MULTIVITAMIN PO) Give 1 tablet by tube daily.   Yes [provider]  mupirocin  ointment (BACTROBAN ) 2 % APPLY TO AFFECTED AREA 3 TIMES A DAY Patient taking differently: Apply 1 Application topically 2 (two) times daily as needed. 03/10/24  Yes Ramgoolam, Andres, MD  polyethylene glycol powder (GLYCOLAX /MIRALAX ) 17 GM/SCOOP powder Place 255 g into feeding tube daily as needed for  mild  constipation. Patient taking differently: Place 8.5-17 g into feeding tube daily as needed for mild constipation (mix as directed). 05/04/21  Yes Cheryle Page, MD  sodium chloride  HYPERTONIC 3 % nebulizer solution Take by nebulization as needed for other (as needed to loosen cough, congestion,  two or three times a day). 10/18/23  Yes Pokhrel, Laxman, MD  lacosamide  (VIMPAT ) 50 MG TABS tablet Take by mouth. Patient not taking: No sig reported 06/17/18 05/01/21  [provider]     The patient is critically ill due to acute hypoxic respiratory failure.  Critical care was necessary to treat or prevent imminent or life-threatening deterioration. Critical care time was spent by me on the following activities: development of a treatment plan with the patient and/or surrogate as well as nursing, discussions with consultants, evaluation of the patient's response to treatment, examination of the patient, obtaining a history from the patient or surrogate, ordering and performing treatments and interventions, ordering and review of laboratory studies, ordering and review of radiographic studies, review of telemetry data including pulse oximetry, re-evaluation of patient's condition and participation in multidisciplinary rounds.   I personally spent 56 minutes providing critical care not including any separately billable procedures.   Zola LOISE Herter, MD [facility] 11/01/2024 9:48 AM          [1]  Allergies Allergen Reactions   Cephalexin  Other (See Comments)    Seizures    Clobazam  Shortness Of Breath and Other (See Comments)    Severe side effects = Respiratory distress   Penicillins Shortness Of Breath and Other (See Comments)    Respiratory distress

## 2024-11-01 NOTE — Progress Notes (Signed)
 LTM maint complete - no skin breakdown under: Fp1 p2 Service A2 O1 A2 P3 T7 Monitored by [facility]

## 2024-11-01 NOTE — Progress Notes (Signed)
 Pharmacy Antibiotic Note  Jo Matthews is a 28 y.o. female admitted on 10/31/2024 with sepsis.  Pharmacy has been consulted for vancomycin  dosing. WBC up 13.2, SCr rising at 1.05 (doubled in past 24 hours). Fever curve down.   Plan: Adjusted Vancomycin  to 750 mg IV every 24 hours -eAUC 428 F/u MRSA PCR, cultures, length of therapy   Height: 4' 11 (149.9 cm) Weight: 49.6 kg (109 lb 5.6 oz) IBW/kg (Calculated) : 43.2  Temp (24hrs), Avg:99.2 F (37.3 C), Min:97.9 F (36.6 C), Max:101.4 F (38.6 C)  Recent Labs  Lab 10/31/24 0639 10/31/24 0649 10/31/24 0907 11/01/24 0256  WBC 2.3*  --   --  13.2*  CREATININE 0.42* 0.50  --  1.05*  LATICACIDVEN  --  1.6 2.2*  --     Estimated Creatinine Clearance: 54.4 mL/min (A) (by C-G formula based on SCr of 1.05 mg/dL (H)).    Allergies[1]  Antimicrobials this admission: Vancomycin  12/15 >>  Doxycycline  12/15 >>  Ceftriaxone  12/15 >>    Microbiology results: 12/15 BCx: pending  12/15 MRSA PCR: pending  Thank you for allowing pharmacy to be a part of this patients care.  Harlene Boga, PharmD Please refer to [facility] for [facility] numbers 11/01/2024 9:32 AM     [1]  Allergies Allergen Reactions   Cephalexin  Other (See Comments)    Seizures    Clobazam  Shortness Of Breath and Other (See Comments)    Severe side effects = Respiratory distress   Penicillins Shortness Of Breath and Other (See Comments)    Respiratory distress

## 2024-11-02 ENCOUNTER — Inpatient Hospital Stay (HOSPITAL_COMMUNITY)

## 2024-11-02 DIAGNOSIS — G40919 Epilepsy, unspecified, intractable, without status epilepticus: Secondary | ICD-10-CM | POA: Diagnosis not present

## 2024-11-02 DIAGNOSIS — J189 Pneumonia, unspecified organism: Secondary | ICD-10-CM | POA: Diagnosis not present

## 2024-11-02 LAB — GLUCOSE, CAPILLARY
Glucose-Capillary: 82 mg/dL (ref 70–99)
Glucose-Capillary: 83 mg/dL (ref 70–99)
Glucose-Capillary: 79 mg/dL (ref 70–99)
Glucose-Capillary: 134 mg/dL — ABNORMAL HIGH (ref 70–99)
Glucose-Capillary: 169 mg/dL — ABNORMAL HIGH (ref 70–99)
Glucose-Capillary: 150 mg/dL — ABNORMAL HIGH (ref 70–99)

## 2024-11-02 LAB — BASIC METABOLIC PANEL WITH GFR
Anion gap: 16 — ABNORMAL HIGH (ref 5–15)
BUN: 30 mg/dL — ABNORMAL HIGH (ref 6–20)
CO2: 20 mmol/L — ABNORMAL LOW (ref 22–32)
Calcium: 8.6 mg/dL — ABNORMAL LOW (ref 8.9–10.3)
Chloride: 94 mmol/L — ABNORMAL LOW (ref 98–111)
Creatinine, Ser: 1.27 mg/dL — ABNORMAL HIGH (ref 0.44–1.00)
GFR, Estimated: 59 mL/min — ABNORMAL LOW (ref >60)
Glucose, Bld: 80 mg/dL (ref 70–99)
Potassium: 4 mmol/L (ref 3.5–5.1)
Sodium: 130 mmol/L — ABNORMAL LOW (ref 135–145)

## 2024-11-02 LAB — PHOSPHORUS: Phosphorus: 5.8 mg/dL — ABNORMAL HIGH (ref 2.5–4.6)

## 2024-11-02 LAB — MAGNESIUM: Magnesium: 3.7 mg/dL — ABNORMAL HIGH (ref 1.7–2.4)

## 2024-11-02 MED ORDER — HEPARIN SODIUM (PORCINE) 5000 UNIT/ML IJ SOLN
5000.0000 [IU] | Freq: Two times a day (BID) | INTRAMUSCULAR | Status: DC
  Administered 2024-11-03 – 2024-11-06 (×7): 5000 [IU] via SUBCUTANEOUS
  Filled 2024-11-02 (×7): qty 1

## 2024-11-02 MED ORDER — FENTANYL CITRATE (PF) 50 MCG/ML IJ SOSY
200.0000 ug | PREFILLED_SYRINGE | Freq: Once | INTRAMUSCULAR | Status: AC
  Administered 2024-11-02: 16:00:00 125 ug via INTRAVENOUS
  Filled 2024-11-02: qty 4

## 2024-11-02 MED ORDER — MIDAZOLAM HCL (PF) 2 MG/2ML IJ SOLN
10.0000 mg | Freq: Once | INTRAMUSCULAR | Status: AC
  Administered 2024-11-02: 16:00:00 5 mg via INTRAVENOUS
  Filled 2024-11-02: qty 10

## 2024-11-02 MED ORDER — FAMOTIDINE 20 MG PO TABS
20.0000 mg | ORAL_TABLET | Freq: Every day | ORAL | Status: DC
  Administered 2024-11-02 – 2024-11-03 (×2): 20 mg
  Filled 2024-11-02 (×2): qty 1

## 2024-11-02 MED ORDER — IPRATROPIUM-ALBUTEROL 0.5-2.5 (3) MG/3ML IN SOLN
3.0000 mL | Freq: Four times a day (QID) | RESPIRATORY_TRACT | Status: DC
  Administered 2024-11-02 – 2024-11-06 (×11): 3 mL via RESPIRATORY_TRACT
  Filled 2024-11-02 (×12): qty 3

## 2024-11-02 NOTE — Progress Notes (Signed)
 Subjective: NAEO.  Mother at bedside.  Denies any other concerns.  ROS: Unable to obtain due to poor mental status  Examination  Vital signs in last 24 hours: Temp:  [96.3 F (35.7 C)-98.6 F (37 C)] 97.6 F (36.4 C) (12/17 1128) Pulse Rate:  [93-207] 95 (12/17 0630) Resp:  [23-32] 29 (12/17 0645) BP: (88-140)/(68-97) 97/74 (12/17 0645) SpO2:  [89 %-99 %] 92 % (12/17 0630) FiO2 (%):  [55 %-60 %] 55 % (12/17 0500) Weight:  [50.4 kg] 50.4 kg (12/17 0500)  General: lying in bed, not in apparent distress, intubated Neuro: Comatose, does not open eyes to noxious stimuli, does not follow commands, pupils equally round and reactive to light, corneal reflex absent, cough reflex absent, does not withdraw to noxious stimuli  Basic Metabolic Panel: Recent Labs  Lab 10/31/24 0639 10/31/24 0649 10/31/24 0650 10/31/24 0907 10/31/24 1355 10/31/24 1412 11/01/24 0256 11/02/24 0249 11/02/24 1011  NA 138 137 136 135 135  --  134*  --  130*  K 3.2* 2.9* 2.8* 2.8* 4.2  --  3.7  --  4.0  CL 90* 88*  --   --   --   --  96*  --  94*  CO2 36*  --   --   --   --   --  26  --  20*  GLUCOSE 142* 147*  --   --   --   --  127*  --  80  BUN 9 11  --   --   --   --  18  --  30*  CREATININE 0.42* 0.50  --   --   --   --  1.05*  --  1.27*  CALCIUM  9.4  --   --   --   --   --  8.3*  --  8.6*  MG  --   --   --   --   --  1.7 1.3* 3.7*  --   PHOS  --   --   --   --   --  1.9* 1.8* 5.8*  --     CBC: Recent Labs  Lab 10/31/24 0639 10/31/24 0649 10/31/24 0650 10/31/24 0907 10/31/24 1355 11/01/24 0256  WBC 2.3*  --   --   --   --  13.2*  NEUTROABS 1.5*  --   --   --   --  6.9  HGB 11.2* 12.6 11.9* 9.5* 8.5* 8.6*  HCT 36.1 37.0 35.0* 28.0* 25.0* 27.1*  MCV 104.3*  --   --   --   --  105.0*  PLT 176  --   --   --   --  139*     Coagulation Studies: Recent Labs    10/31/24 0639  LABPROT 13.1  INR 0.9    Imaging No new brain imaging overnight       ASSESSMENT AND PLAN: 28 year old  female with 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  Admitted in ICU due to concern for pneumonia, currently on antibiotics.  Routine EEG was done which showed generalized epileptiform discharges and therefore she was started on long-term EEG.   Intractable epilepsy - Patient has baseline  intractable epilepsy with frequent seizures sometimes on a daily basis.  Family states she does not respond well to any changes in antiseizure medications.  Therefore for now I agree with family to continue her home dose of antiseizure medications -Of note, we will need to keep an eye on renal function and adjust Keppra  dosing if renal function continues to worsen.  Discussed plan with pharmacy - I was planning on discontinuing LTM.  However patient's Versed  was stopped earlier this morning.  Therefore patient's mother requested continuing LTM for at least 1 more day. - Defer further management of comorbidities per primary team - Discussed plan with family at bedside     I personally spent a total of 36 minutes in the care of the patient today including getting/reviewing separately obtained history, performing a medically appropriate exam/evaluation, counseling and educating, placing orders, referring and communicating with other health care professionals, documenting clinical information in the EHR, independently interpreting results, and coordinating care.         Jo Matthews Epilepsy [facility] For questions after 5pm please refer to AMION to reach the Neurologist on call

## 2024-11-02 NOTE — Progress Notes (Signed)
 vLTM maintenance  All impedances below 10k  No skin breakdown noted at FP1 FP2 FZ F3

## 2024-11-02 NOTE — Progress Notes (Signed)
 NAME:  Jo Matthews, MRN:  [id number], DOB:  [birth year], LOS: 2 ADMISSION DATE:  10/31/2024, CONSULTATION DATE: 12/15 REFERRING MD: Dr. Tobie IM TS, CHIEF COMPLAINT: Shortness of breath  History of Present Illness:  28 year old female with past medical history significant for Rett syndrome, wheelchair/bedbound, nonverbal at baseline, chronic respiratory failure with hypoxia on 2 L, PEG tube dependent, and seizure history on multiple AEDs.  She was in her usual state of health until 12/14 when she had 1 episode of hypoxia (wears pulse ox at home).  This recovered spontaneously and she was again doing fine until 1215 early a.m. when she was increasingly lethargic as well as hypoxic with sats in the 70s and 80s.  Also had new onset fever.  Imaging with concern for pneumonia and she was admitted to the internal medicine teaching service for management thereof.  Vancomycin  and doxycycline  were initiated.  While still in the ED she became increasingly hypoxic requiring up titration of heated high flow nasal cannula to 80% FiO2 and 40 L/min.  Despite this she remained in the high 80s to low 90s and PCCM was asked to evaluate for ICU care.  Pertinent  Medical History   has a past medical history of Fracture, humerus closed, G tube feedings (HCC), Pneumonia, Recurrent fever of unknown cause (08/31/2012), Rett's syndrome, Scoliosis, Seizure disorder (HCC), Seizures (HCC), and Weight loss.   Significant Hospital Events: Including procedures, antibiotic start and stop dates in addition to other pertinent events   12/15 admit to ICU for respiratory failure, intubated in the ICU 11/01/2024 remains mechanically intubated,  Interim History / Subjective:  Unresponsive, no complaints, thick white secretions from ETT tube  Objective    Blood pressure 97/74, pulse 95, temperature 98.3 F (36.8 C), temperature source Axillary, resp. rate (!) 29, height 4' 11 (1.499 m), weight 50.4 kg, SpO2 92%.    Vent Mode:  PRVC FiO2 (%):  [55 %-60 %] 55 % Set Rate:  [26 bmp] 26 bmp Vt Set:  [320 mL] 320 mL PEEP:  [5 cmH20] 5 cmH20 Plateau Pressure:  [28 cmH20-33 cmH20] 33 cmH20   Intake/Output Summary (Last 24 hours) at 11/02/2024 0855 Last data filed at 11/02/2024 0600 Gross per 24 hour  Intake 1521.75 ml  Output 250 ml  Net 1271.75 ml   Filed Weights   10/31/24 1230 11/01/24 0500 11/02/24 0500  Weight: 47.9 kg 49.6 kg 50.4 kg    Examination: General: Young female with Rett syndrome, has lower extremity contractures HENT: Normocephalic/atraumatic Lungs: Diminished breath sounds with bilateral crackles and wheezing Cardiovascular: Regular rate and rhythm Abdomen: Soft, nontender, nondistended Extremities: Warm, trace edema, lower extremity contractures Neuro: Unresponsive, not moving spontaneously, difficult to assess   Resolved problem list   Assessment and Plan   Neuro # Rett syndrome # Epilepsy with breakthrough seizures in the setting of infection - 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  Stable EEG findings - Continue AEDs including Depakote , clonazepam , cannabidiol , Keppra , perampanel  - Appreciate neurology recommendations - Change sedation from Versed  to fentanyl .  Will DC Versed  - Will need dosage adjustments based on kidney function    Pulm # Acute hypoxic respiratory failure -currently intubated # Community-acquired pneumonia Bedside ultrasound performed yesterday with no effusions bilaterally Continues to have elevated peak pressures and thick white secretions coming from the ventilator.  FiO2 decreased to 55%.  Plan to obtain CT chest today.  Waiting for BMP to decide whether to administer contrast to rule out  PE as well based on kidney function. -Continue Solu-Cortef  for severe pneumonia   -Full vent support -LTVV, 4-8cc/kg IBW with goal Pplat<30 and DP<15 - Daily evaluation for SBT and extubation - Daily  evaluation for sedation weaning trials     Cardiac/Vascular  #Hypotension (improved) Off pressors   GI Diet: Continue tube feeds GI PPX: Continue Pepcid   ID Community-acquired pneumonia.  Currently on ceftriaxone , doxycycline .  Vancomycin  was stopped as her MRSA screen was negative. Plan for 3 days of doxycycline  and then will continue ceftriaxone  for total of 5 days Sepsis -Mini RVP negative, blood cultures pending   Renal  #Acute kidney injury likely in the setting of sepsis # Mild hyponatremia - Monitor kidney function, adequately resuscitated currently +2.3 L Waiting for repeat BMP  Endo CBG within goal  Heme/Onc DVT ppx : Lovenox  30 mg   MSK/other  No issues Chronic contractures       Labs   CBC: Recent Labs  Lab 10/31/24 0639 10/31/24 0649 10/31/24 0650 10/31/24 0907 10/31/24 1355 11/01/24 0256  WBC 2.3*  --   --   --   --  13.2*  NEUTROABS 1.5*  --   --   --   --  6.9  HGB 11.2* 12.6 11.9* 9.5* 8.5* 8.6*  HCT 36.1 37.0 35.0* 28.0* 25.0* 27.1*  MCV 104.3*  --   --   --   --  105.0*  PLT 176  --   --   --   --  139*    Basic Metabolic Panel: Recent Labs  Lab 10/31/24 0639 10/31/24 0649 10/31/24 0650 10/31/24 0907 10/31/24 1355 10/31/24 1412 11/01/24 0256 11/02/24 0249  NA 138 137 136 135 135  --  134*  --   K 3.2* 2.9* 2.8* 2.8* 4.2  --  3.7  --   CL 90* 88*  --   --   --   --  96*  --   CO2 36*  --   --   --   --   --  26  --   GLUCOSE 142* 147*  --   --   --   --  127*  --   BUN 9 11  --   --   --   --  18  --   CREATININE 0.42* 0.50  --   --   --   --  1.05*  --   CALCIUM  9.4  --   --   --   --   --  8.3*  --   MG  --   --   --   --   --  1.7 1.3* 3.7*  PHOS  --   --   --   --   --  1.9* 1.8* 5.8*   GFR: Estimated Creatinine Clearance: 54.4 mL/min (A) (by C-G formula based on SCr of 1.05 mg/dL (H)). Recent Labs  Lab 10/31/24 0639 10/31/24 0649 10/31/24 0907 11/01/24 0256  WBC 2.3*  --   --  13.2*  LATICACIDVEN  --  1.6  2.2*  --     Liver Function Tests: Recent Labs  Lab 10/31/24 0639 11/01/24 0256  AST 38 51*  ALT 19 21  ALKPHOS 60 41  BILITOT 0.7 0.8  PROT 6.6 5.0*  ALBUMIN 2.7* 1.9*   No results for input(s): LIPASE, AMYLASE in the last 168 hours. No results for input(s): AMMONIA in the last 168 hours.  ABG    Component Value Date/Time  PHART 7.397 10/31/2024 1355   PCO2ART 58.9 (H) 10/31/2024 1355   PO2ART 185 (H) 10/31/2024 1355   HCO3 36.2 (H) 10/31/2024 1355   TCO2 38 (H) 10/31/2024 1355   O2SAT 100 10/31/2024 1355     Coagulation Profile: Recent Labs  Lab 10/31/24 0639  INR 0.9    Cardiac Enzymes: No results for input(s): CKTOTAL, CKMB, CKMBINDEX, TROPONINI in the last 168 hours.  HbA1C: No results found for: HGBA1C  CBG: Recent Labs  Lab 11/01/24 1549 11/01/24 1928 11/01/24 2319 11/02/24 0315 11/02/24 0743  GLUCAP 215* 125* 97 82 83    Review of Systems:   Patient is encephalopathic and/or intubated; therefore, history has been obtained from chart review.   Past Medical History:  She,  has a past medical history of Fracture, humerus closed, G tube feedings (HCC), Pneumonia, Recurrent fever of unknown cause (08/31/2012), Rett's syndrome, Scoliosis, Seizure disorder (HCC), Seizures (HCC), and Weight loss.   Surgical History:   Past Surgical History:  Procedure Laterality Date   BACK SURGERY     BRONCHIAL WASHINGS  10/16/2023   Procedure: BRONCHIAL WASHINGS;  Surgeon: Neda Jennet LABOR, MD;  Location: [facility];  Service: Endoscopy;;   GASTROSTOMY TUBE PLACEMENT     SPINAL GROWTH RODS     SPINE SURGERY N/A    [employer] 02/14/2021   VIDEO BRONCHOSCOPY Left 10/16/2023   Procedure: VIDEO BRONCHOSCOPY WITHOUT FLUORO;  Surgeon: Neda Jennet LABOR, MD;  Location: [facility];  Service: Endoscopy;  Laterality: Left;  mucus plugging     Social History:   reports that she has never smoked. She has never used smokeless tobacco. She reports  that she does not drink alcohol  and does not use drugs.   Family History:  Her family history includes Diabetes in an other family member; Heart disease in an other family member; Hyperlipidemia in an other family member.   Allergies Allergies[1]   Home Medications  Prior to Admission medications  Medication Sig Start Date End Date Taking? Authorizing Provider  albuterol  (PROVENTIL ) (2.5 MG/3ML) 0.083% nebulizer solution Take 3 mLs (2.5 mg total) by nebulization every 6 (six) hours as needed for wheezing or shortness of breath. 03/29/24  Yes Ramgoolam, Gustav, MD  Calcium  Carbonate Antacid (CALCIUM  CARBONATE, DOSED IN MG ELEMENTAL CALCIUM ,) 1250 MG/5ML SUSP Place 2 mLs (200 mg of elemental calcium  total) into feeding tube 3 (three) times daily as needed for indigestion. 05/04/21  Yes Cheryle Page, MD  cannabidiol  (EPIDIOLEX ) 100 MG/ML solution Place 350 mg into feeding tube in the morning and at bedtime. 03/11/21  Yes [provider]  clonazePAM  (KLONOPIN ) 0.25 MG disintegrating tablet TAKE 1 TAB BY MOUTH IN MORNING & 2 TABS AT NIGHT WITH 15 EXTRA FOR BREAKTHROUGH SEIZURE FOR 30 DAYS 06/03/24 10/31/24 Yes Ramgoolam, Andres, MD  diazepam  (DIASAT) 20 MG GEL Place 12.5 mg rectally as needed (for a seizure lasting more than 5 minutes). 09/18/21  Yes [provider]  divalproex  (DEPAKOTE  SPRINKLE) 125 MG capsule 375-500 mg See admin instructions. Place 375 mg into the tube in the morning & afternoon and 500 mg at bedtime   Yes [provider]  FYCOMPA  0.5 MG/ML SUSP Place 2 mg into feeding tube at bedtime.   Yes [provider]  guaiFENesin  (ROBITUSSIN CHEST CONGESTION PO) Place 10 mLs into feeding tube daily as needed (cough).   Yes [provider]  ibuprofen  (ADVIL ) 100 MG/5ML suspension Place 300 mg into feeding tube every 4 (four) hours as needed for mild pain.  Yes [provider]  levETIRAcetam  (KEPPRA ) 100 MG/ML solution Place 2,000-2,250 mg  into feeding tube See admin instructions. Place 2,000 mg (20 ml's) into the tube in the morning and 2,250 mg (22.5 ml's) at bedtime 01/08/18  Yes [provider]  levocetirizine (XYZAL ) 2.5 MG/5ML solution Take 10 mLs (5 mg total) by mouth every evening. 03/10/24 10/31/24 Yes Ramgoolam, Gustav, MD  Multiple Vitamin (MULTIVITAMIN PO) Give 1 tablet by tube daily.   Yes [provider]  mupirocin  ointment (BACTROBAN ) 2 % APPLY TO AFFECTED AREA 3 TIMES A DAY Patient taking differently: Apply 1 Application topically 2 (two) times daily as needed. 03/10/24  Yes Ramgoolam, Andres, MD  polyethylene glycol powder (GLYCOLAX /MIRALAX ) 17 GM/SCOOP powder Place 255 g into feeding tube daily as needed for mild constipation. Patient taking differently: Place 8.5-17 g into feeding tube daily as needed for mild constipation (mix as directed). 05/04/21  Yes Cheryle Page, MD  sodium chloride  HYPERTONIC 3 % nebulizer solution Take by nebulization as needed for other (as needed to loosen cough, congestion,  two or three times a day). 10/18/23  Yes Pokhrel, Laxman, MD  lacosamide  (VIMPAT ) 50 MG TABS tablet Take by mouth. Patient not taking: No sig reported 06/17/18 05/01/21  [provider]     The patient is critically ill due to acute hypoxic respiratory failure requiring mechanical ventilation.  Critical care was necessary to treat or prevent imminent or life-threatening deterioration. Critical care time was spent by me on the following activities: development of a treatment plan with the patient and/or surrogate as well as nursing, discussions with consultants, evaluation of the patient's response to treatment, examination of the patient, obtaining a history from the patient or surrogate, ordering and performing treatments and interventions, ordering and review of laboratory studies, ordering and review of radiographic studies, review of telemetry data including pulse oximetry, re-evaluation of  patient's condition and participation in multidisciplinary rounds.   I personally spent 34 minutes providing critical care not including any separately billable procedures.   Zola LOISE Herter, MD [facility] 11/02/2024 8:59 AM           [1]  Allergies Allergen Reactions   Cephalexin  Other (See Comments)    Seizures    Clobazam  Shortness Of Breath and Other (See Comments)    Severe side effects = Respiratory distress   Penicillins Shortness Of Breath and Other (See Comments)    Respiratory distress

## 2024-11-02 NOTE — Progress Notes (Signed)
 RT assisted Dr. Zaida with bedside bronchoscopy. Sample obtained from R lower lobe and taken to lab. ETT retracted from 21cm to 18cm per MD. Placement verified with bronchoscope. Verbal order received to increase peep to 8 and leave on 100%. RT may titrate FiO2 as SpO2 allows, peep to remain at [facility]. RT will continue to monitor and be available as needed.

## 2024-11-02 NOTE — Procedures (Signed)
 Patient Name: Jo Matthews  MRN: [id number]  Epilepsy Attending: Arlin MALVA Krebs  Referring Physician/Provider: Krebs Arlin MALVA, MD  Duration: 11/01/2024 1005 to 11/02/2024 1005   Patient history: 28yo F patient with history of Rett syndrome, seizures, oxygen dependence here for evaluation of hypoxia. EEG to evaluate for seizure   Level of alertness: comatose   AEDs during EEG study: CBD, Clonazepam , VPA, LEV, Perampanel , versed    Technical aspects: This EEG study was done with scalp electrodes positioned according to the 10-20 International system of electrode placement. Electrical activity was reviewed with band pass filter of 1-70Hz , sensitivity of 7 uV/mm, display speed of 21mm/sec with a 60Hz  notched filter applied as appropriate. EEG data were recorded continuously and digitally stored.  Video monitoring was available and reviewed as appropriate.   Description: EEG showed continuous generalized 3 to 6 Hz theta-delta slowing. Generalized periodic epileptiform discharges with overriding fast activity and rhythmicity were noted at 1-2.5Hz , lasting 3-12 seconds alternating with 1-5 seconds of generalized suppression. Hyperventilation and photic stimulation were not performed.     Event button was pressed on 11/01/2024 at 1111.  Per mother at bedside patient appeared to have eye opening/flickering.  Concomitant EEG continue to show generalized periodic epileptiform discharges with overriding fast activity and rhythmicity.   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 However the frequency of epileptiform discharges  as well as overriding fast activity is on the ictal-interictal continuum with increased risk of seizures.    Additionally there is  moderate diffuse encephalopathy.  Event button was pressed on 11/01/2024 at 1111 for  eye-opening/flickering.  Concomitant EEG continue to show epileptiform discharges.  Given patient's highly abnormal baseline EEG, it is difficult to ascertain if eye-opening was epileptic or not.   [name]

## 2024-11-02 NOTE — Progress Notes (Signed)
 Pt transported to CT via vent, no complications noted down or back. Suctioned pt's ET tube and mouth prior to transport. RN at bedside.

## 2024-11-03 ENCOUNTER — Inpatient Hospital Stay (HOSPITAL_COMMUNITY)

## 2024-11-03 DIAGNOSIS — G40919 Epilepsy, unspecified, intractable, without status epilepticus: Secondary | ICD-10-CM | POA: Diagnosis not present

## 2024-11-03 LAB — BASIC METABOLIC PANEL WITH GFR
Anion gap: 16 — ABNORMAL HIGH (ref 5–15)
BUN: 35 mg/dL — ABNORMAL HIGH (ref 6–20)
CO2: 22 mmol/L (ref 22–32)
Calcium: 9.1 mg/dL (ref 8.9–10.3)
Chloride: 94 mmol/L — ABNORMAL LOW (ref 98–111)
Creatinine, Ser: 1.48 mg/dL — ABNORMAL HIGH (ref 0.44–1.00)
GFR, Estimated: 49 mL/min — ABNORMAL LOW (ref >60)
Glucose, Bld: 213 mg/dL — ABNORMAL HIGH (ref 70–99)
Potassium: 3.3 mmol/L — ABNORMAL LOW (ref 3.5–5.1)
Sodium: 132 mmol/L — ABNORMAL LOW (ref 135–145)

## 2024-11-03 LAB — POCT I-STAT 7, (LYTES, BLD GAS, ICA,H+H)
Acid-base deficit: 2 mmol/L (ref 0.0–2.0)
Acid-base deficit: 2 mmol/L (ref 0.0–2.0)
Bicarbonate: 24.7 mmol/L (ref 20.0–28.0)
Bicarbonate: 25.8 mmol/L (ref 20.0–28.0)
Calcium, Ion: 1.27 mmol/L (ref 1.15–1.40)
Calcium, Ion: 1.32 mmol/L (ref 1.15–1.40)
HCT: 24 % — ABNORMAL LOW (ref 36.0–46.0)
HCT: 29 % — ABNORMAL LOW (ref 36.0–46.0)
Hemoglobin: 8.2 g/dL — ABNORMAL LOW (ref 12.0–15.0)
Hemoglobin: 9.9 g/dL — ABNORMAL LOW (ref 12.0–15.0)
O2 Saturation: 95 %
O2 Saturation: 87 %
Patient temperature: 97.6
Patient temperature: 98.8
Potassium: 3.2 mmol/L — ABNORMAL LOW (ref 3.5–5.1)
Potassium: 4 mmol/L (ref 3.5–5.1)
Sodium: 130 mmol/L — ABNORMAL LOW (ref 135–145)
Sodium: 133 mmol/L — ABNORMAL LOW (ref 135–145)
TCO2: 26 mmol/L (ref 22–32)
TCO2: 28 mmol/L (ref 22–32)
pCO2 arterial: 52.8 mmHg — ABNORMAL HIGH (ref 32–48)
pCO2 arterial: 60.5 mmHg — ABNORMAL HIGH (ref 32–48)
pH, Arterial: 7.276 — ABNORMAL LOW (ref 7.35–7.45)
pH, Arterial: 7.239 — ABNORMAL LOW (ref 7.35–7.45)
pO2, Arterial: 83 mmHg (ref 83–108)
pO2, Arterial: 65 mmHg — ABNORMAL LOW (ref 83–108)

## 2024-11-03 LAB — PHOSPHORUS: Phosphorus: 5.7 mg/dL — ABNORMAL HIGH (ref 2.5–4.6)

## 2024-11-03 LAB — GLUCOSE, CAPILLARY
Glucose-Capillary: 193 mg/dL — ABNORMAL HIGH (ref 70–99)
Glucose-Capillary: 208 mg/dL — ABNORMAL HIGH (ref 70–99)
Glucose-Capillary: 174 mg/dL — ABNORMAL HIGH (ref 70–99)
Glucose-Capillary: 192 mg/dL — ABNORMAL HIGH (ref 70–99)
Glucose-Capillary: 133 mg/dL — ABNORMAL HIGH (ref 70–99)
Glucose-Capillary: 123 mg/dL — ABNORMAL HIGH (ref 70–99)

## 2024-11-03 LAB — MAGNESIUM: Magnesium: 3.4 mg/dL — ABNORMAL HIGH (ref 1.7–2.4)

## 2024-11-03 LAB — LEGIONELLA PNEUMOPHILA SEROGP 1 UR AG: L. pneumophila Serogp 1 Ur Ag: NEGATIVE

## 2024-11-03 MED ORDER — PIPERACILLIN-TAZOBACTAM 3.375 G IVPB
3.3750 g | Freq: Three times a day (TID) | INTRAVENOUS | Status: DC
  Administered 2024-11-03 – 2024-11-06 (×9): 3.375 g via INTRAVENOUS
  Filled 2024-11-03 (×10): qty 50

## 2024-11-03 MED ORDER — CISATRACURIUM BOLUS VIA INFUSION
0.1000 mg/kg | Freq: Once | INTRAVENOUS | Status: AC
  Administered 2024-11-03: 23:00:00 5.3 mg via INTRAVENOUS
  Filled 2024-11-03: qty 6

## 2024-11-03 MED ORDER — FUROSEMIDE 10 MG/ML IJ SOLN
80.0000 mg | Freq: Once | INTRAMUSCULAR | Status: AC
  Administered 2024-11-03: 18:00:00 80 mg via INTRAVENOUS
  Filled 2024-11-03: qty 8

## 2024-11-03 MED ORDER — FENTANYL BOLUS VIA INFUSION: 50.0000 ug | INTRAVENOUS | Status: DC | PRN

## 2024-11-03 MED ORDER — FENTANYL CITRATE (PF) 50 MCG/ML IJ SOSY: 50.0000 ug | PREFILLED_SYRINGE | INTRAMUSCULAR | Status: DC | PRN

## 2024-11-03 MED ORDER — FENTANYL CITRATE (PF) 50 MCG/ML IJ SOSY
50.0000 ug | PREFILLED_SYRINGE | Freq: Once | INTRAMUSCULAR | Status: AC
  Administered 2024-11-03: 23:00:00 50 ug via INTRAVENOUS
  Filled 2024-11-03: qty 1

## 2024-11-03 MED ORDER — FENTANYL 2500MCG IN NS 250ML (10MCG/ML) PREMIX INFUSION
50.0000 ug/h | INTRAVENOUS | Status: DC
  Administered 2024-11-03: 21:00:00 50 ug/h via INTRAVENOUS

## 2024-11-03 MED ORDER — DEXAMETHASONE SOD PHOSPHATE PF 10 MG/ML IJ SOLN
20.0000 mg | Freq: Every day | INTRAMUSCULAR | Status: DC
  Administered 2024-11-03 – 2024-11-06 (×4): 20 mg via INTRAVENOUS
  Filled 2024-11-03 (×4): qty 2

## 2024-11-03 MED ORDER — INSULIN ASPART 100 UNIT/ML IJ SOLN
0.0000 [IU] | INTRAMUSCULAR | Status: DC
  Administered 2024-11-03: 16:00:00 2 [IU] via SUBCUTANEOUS
  Administered 2024-11-03: 20:00:00 1 [IU] via SUBCUTANEOUS
  Administered 2024-11-03: 12:00:00 2 [IU] via SUBCUTANEOUS
  Administered 2024-11-03: 09:00:00 3 [IU] via SUBCUTANEOUS
  Administered 2024-11-04: 1 [IU] via SUBCUTANEOUS
  Filled 2024-11-03: qty 2
  Filled 2024-11-03 (×2): qty 1
  Filled 2024-11-03: qty 3
  Filled 2024-11-03: qty 2

## 2024-11-03 MED ORDER — ARTIFICIAL TEARS OPHTHALMIC OINT
1.0000 | TOPICAL_OINTMENT | Freq: Three times a day (TID) | OPHTHALMIC | Status: DC
  Administered 2024-11-03 – 2024-11-06 (×6): 1 via OPHTHALMIC
  Filled 2024-11-03 (×3): qty 3.5

## 2024-11-03 MED ORDER — PROPOFOL 1000 MG/100ML IV EMUL
25.0000 ug/kg/min | INTRAVENOUS | Status: DC
  Administered 2024-11-03 – 2024-11-04 (×2): 25 ug/kg/min via INTRAVENOUS
  Filled 2024-11-03 (×2): qty 100

## 2024-11-03 MED ORDER — SODIUM CHLORIDE 0.9 % IV SOLN
0.0000 ug/kg/min | INTRAVENOUS | Status: DC
  Administered 2024-11-03: 23:00:00 3 ug/kg/min via INTRAVENOUS
  Filled 2024-11-03: qty 20

## 2024-11-03 MED ORDER — POTASSIUM CHLORIDE 20 MEQ PO PACK
40.0000 meq | PACK | Freq: Once | ORAL | Status: AC
  Administered 2024-11-03: 10:00:00 40 meq
  Filled 2024-11-03: qty 2

## 2024-11-03 MED ORDER — STERILE WATER FOR INJECTION IJ SOLN
50.0000 ng/kg/min | INTRAVENOUS | Status: DC
  Administered 2024-11-03 – 2024-11-04 (×2): 50 ng/kg/min via RESPIRATORY_TRACT
  Filled 2024-11-03 (×3): qty 5

## 2024-11-03 MED ADMIN — Fentanyl Citrate-NaCl 0.9% IV Soln 2.5 MG/250ML: 50 ug/h | INTRAVENOUS | @ 18:00:00 | NDC 99999070038

## 2024-11-03 MED FILL — Fentanyl Citrate-NaCl 0.9% IV Soln 2.5 MG/250ML: 0.0000 ug/h | INTRAVENOUS | Qty: 250 | Status: AC

## 2024-11-03 NOTE — Progress Notes (Signed)
°   11/03/24 2200  Spiritual Encounters  Type of Visit Initial  Care provided to: [facility] partners present during encounter Nurse  Referral source Nurse (RN/NT/LPN)  Reason for visit Goals of care meeting  OnCall Visit Yes    Chaplain was paged by RN to assist with goals of care discussion. The patient's family was at the bedside. The mom reflected that Jamara usually responds to this treatment but noted no response since Sunday. They voiced concern about her comfort and shared that while they do not want her to suffer, they are also not ready to give up hope. Chaplain listened and provided support. Chaplain remains available if needed.  M.Kubra Susanna Kerry Resident [phone]

## 2024-11-03 NOTE — Progress Notes (Signed)
 NAME:  Jo Matthews, MRN:  [id number], DOB:  1995-11-24, LOS: 3 ADMISSION DATE:  10/31/2024, CONSULTATION DATE: 12/15 REFERRING MD: Dr. Tobie IM TS, CHIEF COMPLAINT: Shortness of breath  History of Present Illness:  28 year old female with past medical history significant for Rett syndrome, wheelchair/bedbound, nonverbal at baseline, chronic respiratory failure with hypoxia on 2 L, PEG tube dependent, and seizure history on multiple AEDs.  She was in her usual state of health until 12/14 when she had 1 episode of hypoxia (wears pulse ox at home).  This recovered spontaneously and she was again doing fine until 1215 early a.m. when she was increasingly lethargic as well as hypoxic with sats in the 70s and 80s.  Also had new onset fever.  Imaging with concern for pneumonia and she was admitted to the internal medicine teaching service for management thereof.  Vancomycin  and doxycycline  were initiated.  While still in the ED she became increasingly hypoxic requiring up titration of heated high flow nasal cannula to 80% FiO2 and 40 L/min.  Despite this she remained in the high 80s to low 90s and PCCM was asked to evaluate for ICU care.  Pertinent  Medical History   has a past medical history of Fracture, humerus closed, G tube feedings (HCC), Pneumonia, Recurrent fever of unknown cause (08/31/2012), Rett's syndrome, Scoliosis, Seizure disorder (HCC), Seizures (HCC), and Weight loss.   Significant Hospital Events: Including procedures, antibiotic start and stop dates in addition to other pertinent events   12/15 admit to ICU for respiratory failure, intubated in the ICU 11/01/2024 remains mechanically intubated, 12/17 Versed  stopped  Interim History / Subjective:  Unresponsive, remains ventilated, father at bedside. No changes since yesterday.   Objective    Blood pressure 102/67, pulse 89, temperature 97.8 F (36.6 C), temperature source Axillary, resp. rate (!) 27, height 4' 11 (1.499 m),  weight 53 kg, SpO2 97%.    Vent Mode: PRVC FiO2 (%):  [55 %-100 %] 80 % Set Rate:  [26 bmp] 26 bmp Vt Set:  [320 mL] 320 mL PEEP:  [5 cmH20-10 cmH20] 10 cmH20 Plateau Pressure:  [24 cmH20-34 cmH20] 34 cmH20   Intake/Output Summary (Last 24 hours) at 11/03/2024 0718 Last data filed at 11/03/2024 0700 Gross per 24 hour  Intake 1485.73 ml  Output --  Net 1485.73 ml   Filed Weights   11/01/24 0500 11/02/24 0500 11/03/24 0427  Weight: 49.6 kg 50.4 kg 53 kg    Examination: General: Young female with Rett syndrome, has lower extremity contractures HENT: Normocephalic/atraumatic Lungs: Mechanically ventilated, coarse breath sounds Cardiovascular: RRR Abdomen: Soft, nontender, nondistended Extremities: Warm, trace edema, lower extremity contractures Neuro: Unresponsive, not moving spontaneously, difficult to assess   Resolved problem list   Assessment and Plan   Neuro # Rett syndrome # Epilepsy with breakthrough seizures in the setting of infection - 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  Stable EEG findings - Continue AEDs including Depakote , clonazepam , cannabidiol , Keppra , perampanel  - Appreciate neurology recommendations - Fentanyl  for sedation, Versed  stopped  - Will need dosage adjustments based on kidney function   Pulm # Acute hypoxic respiratory failure -currently intubated # Community-acquired pneumonia Bronchoscopy done 12/17. Remains mechanically ventilated. Peak pressures 35-37 still. CT chest showed diffuse opacities w/ bronchograms in right lung c/f PNA. Fio2 at 80%, unable to pefrom SBT.  -Continue Solu-Cortef  100 mg BID for severe pneumonia -Full vent support -LTVV, 4-8cc/kg IBW with goal Pplat<30 and DP<15 -Daily evaluation for SBT  and extubation -Daily evaluation for sedation weaning trials  -F/u BAL (so far none seen)  Cardiac/Vascular  #Hypotension (improved) Off pressors   GI Diet:  Continue tube feeds GI PPX: Continue Pepcid   ID Community-acquired pneumonia.   -vanc stopped as MRSA nares negative  -completed 3 days of doxycycline   -ceftriaxone  (4/5 days)  -Mini RVP negative, blood cultures pending  Renal  #Acute kidney injury likely in the setting of sepsis # Mild hyponatremia - Monitor kidney function, adequately resuscitated currently +2.3 L  Endo CBG elevated in 200s. Start SSI-S today   Heme/Onc DVT ppx : Lovenox  30 mg  MSK/other  No issues Chronic contractures   Labs   CBC: Recent Labs  Lab 10/31/24 0639 10/31/24 0649 10/31/24 0650 10/31/24 0907 10/31/24 1355 11/01/24 0256 11/03/24 0651  WBC 2.3*  --   --   --   --  13.2*  --   NEUTROABS 1.5*  --   --   --   --  6.9  --   HGB 11.2*   < > 11.9* 9.5* 8.5* 8.6* 8.2*  HCT 36.1   < > 35.0* 28.0* 25.0* 27.1* 24.0*  MCV 104.3*  --   --   --   --  105.0*  --   PLT 176  --   --   --   --  139*  --    < > = values in this interval not displayed.    Basic Metabolic Panel: Recent Labs  Lab 10/31/24 0639 10/31/24 0649 10/31/24 0650 10/31/24 0907 10/31/24 1355 10/31/24 1412 11/01/24 0256 11/02/24 0249 11/02/24 1011 11/03/24 0437 11/03/24 0651  NA 138 137   < > 135 135  --  134*  --  130*  --  130*  K 3.2* 2.9*   < > 2.8* 4.2  --  3.7  --  4.0  --  3.2*  CL 90* 88*  --   --   --   --  96*  --  94*  --   --   CO2 36*  --   --   --   --   --  26  --  20*  --   --   GLUCOSE 142* 147*  --   --   --   --  127*  --  80  --   --   BUN 9 11  --   --   --   --  18  --  30*  --   --   CREATININE 0.42* 0.50  --   --   --   --  1.05*  --  1.27*  --   --   CALCIUM  9.4  --   --   --   --   --  8.3*  --  8.6*  --   --   MG  --   --   --   --   --  1.7 1.3* 3.7*  --  3.4*  --   PHOS  --   --   --   --   --  1.9* 1.8* 5.8*  --  5.7*  --    < > = values in this interval not displayed.   GFR: Estimated Creatinine Clearance: 49 mL/min (A) (by C-G formula based on SCr of 1.27 mg/dL (H)). Recent Labs   Lab 10/31/24 0639 10/31/24 0649 10/31/24 0907 11/01/24 0256  WBC 2.3*  --   --  13.2*  LATICACIDVEN  --  1.6 2.2*  --     Liver Function Tests: Recent Labs  Lab 10/31/24 0639 11/01/24 0256  AST 38 51*  ALT 19 21  ALKPHOS 60 41  BILITOT 0.7 0.8  PROT 6.6 5.0*  ALBUMIN 2.7* 1.9*   No results for input(s): LIPASE, AMYLASE in the last 168 hours. No results for input(s): AMMONIA in the last 168 hours.  ABG    Component Value Date/Time   PHART 7.276 (L) 11/03/2024 0651   PCO2ART 52.8 (H) 11/03/2024 0651   PO2ART 83 11/03/2024 0651   HCO3 24.7 11/03/2024 0651   TCO2 26 11/03/2024 0651   ACIDBASEDEF 2.0 11/03/2024 0651   O2SAT 95 11/03/2024 0651     Coagulation Profile: Recent Labs  Lab 10/31/24 0639  INR 0.9    Cardiac Enzymes: No results for input(s): CKTOTAL, CKMB, CKMBINDEX, TROPONINI in the last 168 hours.  HbA1C: No results found for: HGBA1C  CBG: Recent Labs  Lab 11/02/24 1127 11/02/24 1536 11/02/24 1943 11/02/24 2314 11/03/24 0316  GLUCAP 79 134* 169* 150* 193*    Review of Systems:   Patient is encephalopathic and/or intubated; therefore, history has been obtained from chart review.   Past Medical History:  She,  has a past medical history of Fracture, humerus closed, G tube feedings (HCC), Pneumonia, Recurrent fever of unknown cause (08/31/2012), Rett's syndrome, Scoliosis, Seizure disorder (HCC), Seizures (HCC), and Weight loss.   Surgical History:   Past Surgical History:  Procedure Laterality Date   BACK SURGERY     BRONCHIAL WASHINGS  10/16/2023   Procedure: BRONCHIAL WASHINGS;  Surgeon: Neda Jennet LABOR, MD;  Location: [facility];  Service: Endoscopy;;   GASTROSTOMY TUBE PLACEMENT     SPINAL GROWTH RODS     SPINE SURGERY N/A    [employer] 02/14/2021   VIDEO BRONCHOSCOPY Left 10/16/2023   Procedure: VIDEO BRONCHOSCOPY WITHOUT FLUORO;  Surgeon: Neda Jennet LABOR, MD;  Location: [facility];  Service: Endoscopy;   Laterality: Left;  mucus plugging     Social History:   reports that she has never smoked. She has never used smokeless tobacco. She reports that she does not drink alcohol  and does not use drugs.   Family History:  Her family history includes Diabetes in an other family member; Heart disease in an other family member; Hyperlipidemia in an other family member.   Allergies Allergies[1]   Home Medications  Prior to Admission medications  Medication Sig Start Date End Date Taking? Authorizing Provider  albuterol  (PROVENTIL ) (2.5 MG/3ML) 0.083% nebulizer solution Take 3 mLs (2.5 mg total) by nebulization every 6 (six) hours as needed for wheezing or shortness of breath. 03/29/24  Yes Ramgoolam, Gustav, MD  Calcium  Carbonate Antacid (CALCIUM  CARBONATE, DOSED IN MG ELEMENTAL CALCIUM ,) 1250 MG/5ML SUSP Place 2 mLs (200 mg of elemental calcium  total) into feeding tube 3 (three) times daily as needed for indigestion. 05/04/21  Yes Cheryle Page, MD  cannabidiol  (EPIDIOLEX ) 100 MG/ML solution Place 350 mg into feeding tube in the morning and at bedtime. 03/11/21  Yes [provider]  clonazePAM  (KLONOPIN ) 0.25 MG disintegrating tablet TAKE 1 TAB BY MOUTH IN MORNING & 2 TABS AT NIGHT WITH 15 EXTRA FOR BREAKTHROUGH SEIZURE FOR 30 DAYS 06/03/24 10/31/24 Yes Ramgoolam, Andres, MD  diazepam  (DIASAT) 20 MG GEL Place 12.5 mg rectally as needed (for a seizure lasting more than 5 minutes). 09/18/21  Yes [provider]  divalproex  (DEPAKOTE  SPRINKLE) 125 MG capsule 375-500 mg See admin instructions. Place 375 mg into the  tube in the morning & afternoon and 500 mg at bedtime   Yes [provider]  FYCOMPA  0.5 MG/ML SUSP Place 2 mg into feeding tube at bedtime.   Yes [provider]  guaiFENesin  (ROBITUSSIN CHEST CONGESTION PO) Place 10 mLs into feeding tube daily as needed (cough).   Yes [provider]  ibuprofen  (ADVIL ) 100 MG/5ML suspension Place 300 mg into feeding  tube every 4 (four) hours as needed for mild pain.   Yes [provider]  levETIRAcetam  (KEPPRA ) 100 MG/ML solution Place 2,000-2,250 mg into feeding tube See admin instructions. Place 2,000 mg (20 ml's) into the tube in the morning and 2,250 mg (22.5 ml's) at bedtime 01/08/18  Yes [provider]  levocetirizine (XYZAL ) 2.5 MG/5ML solution Take 10 mLs (5 mg total) by mouth every evening. 03/10/24 10/31/24 Yes Ramgoolam, Gustav, MD  Multiple Vitamin (MULTIVITAMIN PO) Give 1 tablet by tube daily.   Yes [provider]  mupirocin  ointment (BACTROBAN ) 2 % APPLY TO AFFECTED AREA 3 TIMES A DAY Patient taking differently: Apply 1 Application topically 2 (two) times daily as needed. 03/10/24  Yes Ramgoolam, Andres, MD  polyethylene glycol powder (GLYCOLAX /MIRALAX ) 17 GM/SCOOP powder Place 255 g into feeding tube daily as needed for mild constipation. Patient taking differently: Place 8.5-17 g into feeding tube daily as needed for mild constipation (mix as directed). 05/04/21  Yes Cheryle Page, MD  sodium chloride  HYPERTONIC 3 % nebulizer solution Take by nebulization as needed for other (as needed to loosen cough, congestion,  two or three times a day). 10/18/23  Yes Pokhrel, Laxman, MD  lacosamide  (VIMPAT ) 50 MG TABS tablet Take by mouth. Patient not taking: No sig reported 06/17/18 05/01/21  [provider]     Signature: Ozell Nearing, DO Internal Medicine Resident PGY-3 11/03/2024 7:18 AM       [1]  Allergies Allergen Reactions   Cephalexin  Other (See Comments)    Seizures    Clobazam  Shortness Of Breath and Other (See Comments)    Severe side effects = Respiratory distress   Penicillins Shortness Of Breath and Other (See Comments)    Respiratory distress

## 2024-11-03 NOTE — Progress Notes (Signed)
 eLink Physician-Brief Progress Note Patient Name: Jo Matthews DOB: [birth year] MRN: [id number]   Date of Service  11/03/2024  HPI/Events of Note  I had a goals of care conversation with patient's parents and another family member, I explained to them at their request what a DNR order means for [name], after some discussion they decided to make her DNR at this time but continue to offer her the full range of treatment options for now.  eICU Interventions  DNR order placed in the chart.        Dayan Desa U Katasha Riga 11/03/2024, 9:29 PM

## 2024-11-03 NOTE — Progress Notes (Signed)
 Subjective: No acute events overnight.  Father at bedside denies any concerns.  ROS: Unable to obtain due to poor mental status  Examination  Vital signs in last 24 hours: Temp:  [97.8 F (36.6 C)-99.3 F (37.4 C)] 98.6 F (37 C) (12/18 1114) Pulse Rate:  [84-107] 103 (12/18 1115) Resp:  [14-38] 34 (12/18 1115) BP: (98-138)/(64-101) 108/73 (12/18 1115) SpO2:  [81 %-100 %] 92 % (12/18 1115) FiO2 (%):  [65 %-100 %] 70 % (12/18 1010) Weight:  [53 kg] 53 kg (12/18 0427)  General: lying in bed, not in apparent distress, intubated Neuro: Comatose, does not open eyes to noxious stimuli, does not follow commands, pupils equally round and reactive to light, corneal reflex absent, cough reflex absent, does not withdraw to noxious stimuli  Basic Metabolic Panel: Recent Labs  Lab 10/31/24 0639 10/31/24 0649 10/31/24 0650 10/31/24 1355 10/31/24 1412 11/01/24 0256 11/02/24 0249 11/02/24 1011 11/03/24 0437 11/03/24 0651  NA 138 137   < > 135  --  134*  --  130* 132* 130*  K 3.2* 2.9*   < > 4.2  --  3.7  --  4.0 3.3* 3.2*  CL 90* 88*  --   --   --  96*  --  94* 94*  --   CO2 36*  --   --   --   --  26  --  20* 22  --   GLUCOSE 142* 147*  --   --   --  127*  --  80 213*  --   BUN 9 11  --   --   --  18  --  30* 35*  --   CREATININE 0.42* 0.50  --   --   --  1.05*  --  1.27* 1.48*  --   CALCIUM  9.4  --   --   --   --  8.3*  --  8.6* 9.1  --   MG  --   --   --   --  1.7 1.3* 3.7*  --  3.4*  --   PHOS  --   --   --   --  1.9* 1.8* 5.8*  --  5.7*  --    < > = values in this interval not displayed.    CBC: Recent Labs  Lab 10/31/24 0639 10/31/24 0649 10/31/24 0650 10/31/24 0907 10/31/24 1355 11/01/24 0256 11/03/24 0651  WBC 2.3*  --   --   --   --  13.2*  --   NEUTROABS 1.5*  --   --   --   --  6.9  --   HGB 11.2*   < > 11.9* 9.5* 8.5* 8.6* 8.2*  HCT 36.1   < > 35.0* 28.0* 25.0* 27.1* 24.0*  MCV 104.3*  --   --   --   --  105.0*  --   PLT 176  --   --   --   --  139*  --    <  > = values in this interval not displayed.     Coagulation Studies: No results for input(s): LABPROT, INR in the last 72 hours.  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  Admitted in ICU due to concern for pneumonia, currently on antibiotics.  Routine EEG was done which showed generalized epileptiform discharges and therefore she was started on long-term EEG.   Intractable epilepsy - Continue current medications -Of note, we will need to keep an eye on renal function and adjust Keppra  dosing if renal function continues to worsen.    - Continuing on LTM per family request.  Additionally EEG still appears significantly suppressed likely due to delayed clearance of Versed  -If patient remains comatose tomorrow, may need to repeat CT head without contrast - Defer further management of comorbidities per primary team - Discussed plan with family at bedside     I personally spent a total of 27 minutes in the care of the patient today including getting/reviewing separately obtained history, performing a medically appropriate exam/evaluation, counseling and educating, placing orders, referring and communicating with other health care professionals, documenting clinical information in the EHR, independently interpreting results, and coordinating care.       Arlin Krebs Epilepsy [facility] For questions after 5pm please refer to AMION to reach the Neurologist on call

## 2024-11-03 NOTE — Plan of Care (Signed)
  Problem: Nutrition: Goal: Adequate nutrition will be maintained Outcome: Progressing   Problem: Pain Managment: Goal: General experience of comfort will improve and/or be controlled Outcome: Progressing   Problem: Education: Goal: Knowledge of General Education information will improve Description: Including pain rating scale, medication(s)/side effects and non-pharmacologic comfort measures Outcome: Not Progressing   Problem: Clinical Measurements: Goal: Ability to maintain clinical measurements within normal limits will improve Outcome: Not Progressing

## 2024-11-03 NOTE — Plan of Care (Signed)
°  Problem: Education: Goal: Knowledge of General Education information will improve Description: Including pain rating scale, medication(s)/side effects and non-pharmacologic comfort measures Outcome: Progressing   Problem: Health Behavior/Discharge Planning: Goal: Ability to manage health-related needs will improve Outcome: Progressing   Problem: Clinical Measurements: Goal: Ability to maintain clinical measurements within normal limits will improve Outcome: Progressing   Problem: Pain Managment: Goal: General experience of comfort will improve and/or be controlled Outcome: Progressing   Problem: Clinical Measurements: Goal: Will remain free from infection Outcome: Not Progressing Goal: Diagnostic test results will improve Outcome: Not Progressing Goal: Respiratory complications will improve Outcome: Not Progressing   Problem: Activity: Goal: Risk for activity intolerance will decrease Outcome: Not Progressing   Problem: Elimination: Goal: Will not experience complications related to bowel motility Outcome: Not Progressing Goal: Will not experience complications related to urinary retention Outcome: Not Progressing   Problem: Skin Integrity: Goal: Risk for impaired skin integrity will decrease Outcome: Not Progressing   Problem: Activity: Goal: Ability to tolerate increased activity will improve Outcome: Not Progressing   Problem: Respiratory: Goal: Ability to maintain a clear airway and adequate ventilation will improve Outcome: Not Progressing   Problem: Role Relationship: Goal: Method of communication will improve Outcome: Not Progressing   Problem: Respiratory: Goal: Ability to maintain a clear airway and adequate ventilation will improve Outcome: Not Progressing

## 2024-11-03 NOTE — Progress Notes (Signed)
 Foley catheter order placed. This RN and Rosaline, RN attempted two times. First with a 16 Fr then acquired one from pediatric unit, a size 10 Fr. Unable to insert due to patient's anatomy. Purewick placed instead. Dr. Zaida notified.

## 2024-11-03 NOTE — Progress Notes (Signed)
 eLink Physician-Brief Progress Note Patient Name: Jo Matthews DOB: [birth year] MRN: [id number]   Date of Service  11/03/2024  HPI/Events of Note  Severe hypoxemic respiratory failure s/p proning. Post-proning ABG result pending.  eICU Interventions  Follow up ABG result, consider paralysis + Epo if ABG indicates it.        [name] 11/03/2024, 8:12 PM

## 2024-11-03 NOTE — Procedures (Signed)
 Bronchoscopy Procedure Note  Jo Matthews  [id number]  11-Mar-1996  Date:11/03/2024  Time:8:51 AM   Provider Performing:Jonni Oelkers N Brilyn Tuller   Procedure(s):  Flexible bronchoscopy with bronchial alveolar lavage (68375)  Indication(s) Multifocal pneumonia with high peak pressures on the vent   Consent Risks of the procedure as well as the alternatives and risks of each were explained to the patient and/or caregiver.  Consent for the procedure was obtained and is signed in the bedside chart  Anesthesia ICU sedation   Time Out Verified patient identification, verified procedure, site/side was marked, verified correct patient position, special equipment/implants available, medications/allergies/relevant history reviewed, required imaging and test results available.   Sterile Technique Usual hand hygiene, masks, gowns, and gloves were used   Procedure Description Bronchoscope advanced through endotracheal tube and into airway.  Airways were examined down to subsegmental level with findings noted below.   Following diagnostic evaluation, BAL(s) performed in RLL with normal saline and return of 35cc fluid  Findings: Normal airway anatomy and mucosa. Suctioned white purulent secretions and cleared the airway. BAL performed from RLL   Complications/Tolerance None; patient tolerated the procedure well. Chest X-ray is not needed post procedure.   EBL Minimal   Specimen(s) BAL from RLL

## 2024-11-03 NOTE — Procedures (Signed)
 Patient Name: Anastashia Westerfeld  MRN: [id number]  Epilepsy Attending: Arlin MALVA Krebs  Referring Physician/Provider: Krebs Arlin MALVA, MD  Duration: 11/02/2024 1005 to 11/03/2024 0530   Patient history: 28yo F patient with history of Rett syndrome, seizures, oxygen dependence here for evaluation of hypoxia. EEG to evaluate for seizure   Level of alertness: comatose   AEDs during EEG study: CBD, Clonazepam , VPA, LEV, Perampanel    Technical aspects: This EEG study was done with scalp electrodes positioned according to the 10-20 International system of electrode placement. Electrical activity was reviewed with band pass filter of 1-70Hz , sensitivity of 7 uV/mm, display speed of 3mm/sec with a 60Hz  notched filter applied as appropriate. EEG data were recorded continuously and digitally stored.  Video monitoring was available and reviewed as appropriate.   Description: EEG showed burst suppression pattern with bursts of generalized 3-6hz  theta-delta slowing admixed with generalized spikes lasting 2-3 seconds alternating with 5-9 seconds of generalized suppression. Hyperventilation and photic stimulation were not performed.      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 Additionally there is  profound diffuse encephalopathy, likely related to sedation.  Shanan Fitzpatrick O Deavion Strider

## 2024-11-03 NOTE — Progress Notes (Signed)
MB performed maintenance on electrodes. All impedances are below 10k ohms. No skin breakdown noted.  

## 2024-11-04 ENCOUNTER — Inpatient Hospital Stay (HOSPITAL_COMMUNITY)

## 2024-11-04 DIAGNOSIS — G40919 Epilepsy, unspecified, intractable, without status epilepticus: Secondary | ICD-10-CM | POA: Diagnosis not present

## 2024-11-04 DIAGNOSIS — J9601 Acute respiratory failure with hypoxia: Secondary | ICD-10-CM | POA: Diagnosis not present

## 2024-11-04 LAB — BASIC METABOLIC PANEL WITH GFR
Anion gap: 14 (ref 5–15)
BUN: 42 mg/dL — ABNORMAL HIGH (ref 6–20)
CO2: 24 mmol/L (ref 22–32)
Calcium: 10.2 mg/dL (ref 8.9–10.3)
Chloride: 96 mmol/L — ABNORMAL LOW (ref 98–111)
Creatinine, Ser: 1.4 mg/dL — ABNORMAL HIGH (ref 0.44–1.00)
GFR, Estimated: 52 mL/min — ABNORMAL LOW
Glucose, Bld: 72 mg/dL (ref 70–99)
Potassium: 4.2 mmol/L (ref 3.5–5.1)
Sodium: 135 mmol/L (ref 135–145)

## 2024-11-04 LAB — ECHOCARDIOGRAM COMPLETE
AR max vel: 1.76 cm2
AV Area VTI: 1.85 cm2
AV Area mean vel: 1.72 cm2
AV Mean grad: 5 mmHg
AV Peak grad: 9 mmHg
Ao pk vel: 1.5 m/s
Area-P 1/2: 5.42 cm2
Height: 59 in
S' Lateral: 2.3 cm
Weight: 1869.5 [oz_av]

## 2024-11-04 LAB — POCT I-STAT 7, (LYTES, BLD GAS, ICA,H+H)
Acid-base deficit: 1 mmol/L (ref 0.0–2.0)
Acid-base deficit: 11 mmol/L — ABNORMAL HIGH (ref 0.0–2.0)
Acid-base deficit: 2 mmol/L (ref 0.0–2.0)
Acid-base deficit: 2 mmol/L (ref 0.0–2.0)
Bicarbonate: 14.2 mmol/L — ABNORMAL LOW (ref 20.0–28.0)
Bicarbonate: 24.6 mmol/L (ref 20.0–28.0)
Bicarbonate: 26.4 mmol/L (ref 20.0–28.0)
Bicarbonate: 28 mmol/L (ref 20.0–28.0)
Calcium, Ion: 1.01 mmol/L — ABNORMAL LOW (ref 1.15–1.40)
Calcium, Ion: 1.33 mmol/L (ref 1.15–1.40)
Calcium, Ion: 1.34 mmol/L (ref 1.15–1.40)
Calcium, Ion: 1.34 mmol/L (ref 1.15–1.40)
HCT: 15 % — ABNORMAL LOW (ref 36.0–46.0)
HCT: 23 % — ABNORMAL LOW (ref 36.0–46.0)
HCT: 27 % — ABNORMAL LOW (ref 36.0–46.0)
HCT: 28 % — ABNORMAL LOW (ref 36.0–46.0)
Hemoglobin: 5.1 g/dL — CL (ref 12.0–15.0)
Hemoglobin: 7.8 g/dL — ABNORMAL LOW (ref 12.0–15.0)
Hemoglobin: 9.2 g/dL — ABNORMAL LOW (ref 12.0–15.0)
Hemoglobin: 9.5 g/dL — ABNORMAL LOW (ref 12.0–15.0)
O2 Saturation: 100 %
O2 Saturation: 85 %
O2 Saturation: 99 %
O2 Saturation: 99 %
Patient temperature: 97.1
Patient temperature: 97.3
Patient temperature: 97.6
Patient temperature: 97.8
Potassium: 2.1 mmol/L — CL (ref 3.5–5.1)
Potassium: 3.2 mmol/L — ABNORMAL LOW (ref 3.5–5.1)
Potassium: 3.7 mmol/L (ref 3.5–5.1)
Potassium: 4 mmol/L (ref 3.5–5.1)
Sodium: 131 mmol/L — ABNORMAL LOW (ref 135–145)
Sodium: 133 mmol/L — ABNORMAL LOW (ref 135–145)
Sodium: 134 mmol/L — ABNORMAL LOW (ref 135–145)
Sodium: 141 mmol/L (ref 135–145)
TCO2: 15 mmol/L — ABNORMAL LOW (ref 22–32)
TCO2: 26 mmol/L (ref 22–32)
TCO2: 28 mmol/L (ref 22–32)
TCO2: 30 mmol/L (ref 22–32)
pCO2 arterial: 26.4 mmHg — ABNORMAL LOW (ref 32–48)
pCO2 arterial: 46.8 mmHg (ref 32–48)
pCO2 arterial: 61.2 mmHg — ABNORMAL HIGH (ref 32–48)
pCO2 arterial: 71.4 mmHg (ref 32–48)
pH, Arterial: 7.197 — CL (ref 7.35–7.45)
pH, Arterial: 7.24 — ABNORMAL LOW (ref 7.35–7.45)
pH, Arterial: 7.326 — ABNORMAL LOW (ref 7.35–7.45)
pH, Arterial: 7.336 — ABNORMAL LOW (ref 7.35–7.45)
pO2, Arterial: 133 mmHg — ABNORMAL HIGH (ref 83–108)
pO2, Arterial: 179 mmHg — ABNORMAL HIGH (ref 83–108)
pO2, Arterial: 213 mmHg — ABNORMAL HIGH (ref 83–108)
pO2, Arterial: 53 mmHg — ABNORMAL LOW (ref 83–108)

## 2024-11-04 LAB — GLUCOSE, CAPILLARY
Glucose-Capillary: 87 mg/dL (ref 70–99)
Glucose-Capillary: 75 mg/dL (ref 70–99)
Glucose-Capillary: 68 mg/dL — ABNORMAL LOW (ref 70–99)
Glucose-Capillary: 144 mg/dL — ABNORMAL HIGH (ref 70–99)
Glucose-Capillary: 116 mg/dL — ABNORMAL HIGH (ref 70–99)
Glucose-Capillary: 194 mg/dL — ABNORMAL HIGH (ref 70–99)
Glucose-Capillary: 197 mg/dL — ABNORMAL HIGH (ref 70–99)

## 2024-11-04 LAB — CBC
HCT: 29.5 % — ABNORMAL LOW (ref 36.0–46.0)
Hemoglobin: 9.3 g/dL — ABNORMAL LOW (ref 12.0–15.0)
MCH: 32.4 pg (ref 26.0–34.0)
MCHC: 31.5 g/dL (ref 30.0–36.0)
MCV: 102.8 fL — ABNORMAL HIGH (ref 80.0–100.0)
Platelets: 111 K/uL — ABNORMAL LOW (ref 150–400)
RBC: 2.87 MIL/uL — ABNORMAL LOW (ref 3.87–5.11)
RDW: 14.1 % (ref 11.5–15.5)
WBC: 23.8 K/uL — ABNORMAL HIGH (ref 4.0–10.5)
nRBC: 0.3 % — ABNORMAL HIGH (ref 0.0–0.2)

## 2024-11-04 LAB — TRIGLYCERIDES: Triglycerides: 430 mg/dL — ABNORMAL HIGH

## 2024-11-04 MED ORDER — STERILE WATER FOR INJECTION IJ SOLN
50.0000 ng/kg/min | INTRAVENOUS | Status: DC
  Administered 2024-11-04 – 2024-11-06 (×6): 50 ng/kg/min via RESPIRATORY_TRACT
  Filled 2024-11-04 (×7): qty 5

## 2024-11-04 MED ORDER — SODIUM CHLORIDE 0.9 % IV SOLN
250.0000 mL | INTRAVENOUS | Status: AC
  Administered 2024-11-04: 250 mL via INTRAVENOUS

## 2024-11-04 MED ORDER — NOREPINEPHRINE 4 MG/250ML-% IV SOLN: 0.0000 ug/min | INTRAVENOUS | Status: DC

## 2024-11-04 MED ORDER — DEXTROSE 50 % IV SOLN
INTRAVENOUS | Status: AC
  Filled 2024-11-04: qty 50

## 2024-11-04 MED ORDER — LEVETIRACETAM (KEPPRA) 500 MG/5 ML ADULT IV PUSH
1000.0000 mg | Freq: Two times a day (BID) | INTRAVENOUS | Status: DC
  Administered 2024-11-04 – 2024-11-06 (×4): 1000 mg via INTRAVENOUS
  Filled 2024-11-04 (×4): qty 10

## 2024-11-04 MED ORDER — FUROSEMIDE 10 MG/ML IJ SOLN
120.0000 mg | Freq: Once | INTRAVENOUS | Status: AC
  Administered 2024-11-04: 120 mg via INTRAVENOUS
  Filled 2024-11-04: qty 10

## 2024-11-04 MED ORDER — PERFLUTREN LIPID MICROSPHERE
1.0000 mL | INTRAVENOUS | Status: AC | PRN
  Administered 2024-11-04: 3 mL via INTRAVENOUS

## 2024-11-04 MED ORDER — DEXTROSE 50 % IV SOLN
12.5000 g | INTRAVENOUS | Status: AC
  Administered 2024-11-04: 12.5 g via INTRAVENOUS

## 2024-11-04 MED ORDER — INSULIN ASPART 100 UNIT/ML IJ SOLN
0.0000 [IU] | INTRAMUSCULAR | Status: DC
  Administered 2024-11-04 – 2024-11-05 (×4): 1 [IU] via SUBCUTANEOUS
  Filled 2024-11-04 (×3): qty 1

## 2024-11-04 MED ORDER — FENTANYL CITRATE (PF) 2500 MCG/50ML IJ SOLN
50.0000 ug/h | Status: DC
  Administered 2024-11-04: 50 ug/h via INTRAVENOUS
  Filled 2024-11-04: qty 100

## 2024-11-04 MED ORDER — LEVETIRACETAM (KEPPRA) 500 MG/5 ML ADULT IV PUSH
1000.0000 mg | Freq: Two times a day (BID) | INTRAVENOUS | Status: DC
  Filled 2024-11-04: qty 10

## 2024-11-04 MED ORDER — PANTOPRAZOLE SODIUM 40 MG IV SOLR
40.0000 mg | INTRAVENOUS | Status: DC
  Administered 2024-11-04 – 2024-11-06 (×3): 40 mg via INTRAVENOUS
  Filled 2024-11-04 (×3): qty 10

## 2024-11-04 NOTE — Procedures (Signed)
 Patient Name: Jo Matthews  MRN: [id number]  Epilepsy Attending: Arlin MALVA Krebs  Referring Physician/Provider: Krebs Arlin MALVA, MD  Duration: 11/03/2024 1005 to 11/04/2024 1005   Patient history: 28yo F patient with history of Rett syndrome, seizures, oxygen dependence here for evaluation of hypoxia. EEG to evaluate for seizure   Level of alertness: comatose   AEDs during EEG study: CBD, Clonazepam , VPA, LEV, Perampanel , Propofol    Technical aspects: This EEG study was done with scalp electrodes positioned according to the 10-20 International system of electrode placement. Electrical activity was reviewed with band pass filter of 1-70Hz , sensitivity of 7 uV/mm, display speed of 37mm/sec with a 60Hz  notched filter applied as appropriate. EEG data were recorded continuously and digitally stored.  Video monitoring was available and reviewed as appropriate.   Description: EEG showed burst suppression pattern with bursts of generalized 3-6hz  theta-delta slowing admixed with generalized spikes lasting 2-3 seconds alternating with 5-9 seconds of generalized suppression. Hyperventilation and photic stimulation were not performed.      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 Additionally there is  profound diffuse encephalopathy, likely related to sedation.   Graycie Halley O Mikele Sifuentes

## 2024-11-04 NOTE — Progress Notes (Signed)
 Nutrition Follow-up  DOCUMENTATION CODES:  Not applicable  INTERVENTION:  Continue tube feeding via PEG: Osmolite 1.2 at 40 ml/h (960 ml per day) Provides 1152 kcal, 53 gm protein, 787 ml free water  daily Thiamine  100mg  x 7 days for electrolyte abnormalities when starting TF When pt more stable and ready to transition back to home regimen, recommend the following: Osmolite 1.5, 1 carton 3x/d Flush with 50mL before and after each administration (100 3x/d) + an additional 2x/d This provides 1065kcal, 45g protein, and free water   (TF+flush = 1074mL/d)  NUTRITION DIAGNOSIS:  Increased nutrient needs related to acute illness as evidenced by estimated needs. 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  12/15 - presented to ED, intubated  12/17 - bronchoscopy, white purulent secretions noted in airway 12/18 - pt proned 12/19 - flipped supine  Pt resting in bed at the time of assessment, having ECHO done.   Discussed in rounds, pt able to be flipped back supine this AM, nimbex  still being utilized until can be determined if pt will need to be prone again. TF restarted at this time. Were held when pt was prone as PEG was in awkward position and tube was occluded. If pt will need to be proned consistently may need to alter TF regimen to ensure pt's nutrition needs are met.   MV: 7.7 L/min Temp (24hrs), Avg:98.2 F (36.8 C), Min:97.1 F (36.2 C), Max:98.8 F (37.1 C) MAP (cuff): 85-140mmHg  Propofol  at current infusion rate (7.16mL/h) provides 210kcal/d  Admit weight: 45.4 kg   Current weight: 53 kg     Intake/Output Summary (Last 24 hours) at 11/04/2024 9095 Last data filed at 11/04/2024 0700 Gross per 24 hour  Intake 1190.96 ml  Output 700 ml  Net 490.96 ml  Net IO Since Admission: 5,786.66 mL [11/04/24 0904]  Drains/Lines: PEG LUQ UOP x 24 hours  Nutritionally Relevant Medications: Scheduled Meds:  dexamethasone    20 mg Intravenous Daily   divalproex   375 mg Oral BID   OSMOLITE 1.2 CAL  1,000 mL Per Tube Q24H   levETIRAcetam   1,000 mg Intravenous Q12H   pantoprazole  IV  40 mg Intravenous Q24H   Perampanel   4 mL Per Tube QHS   thiamine   100 mg Per Tube Daily   Continuous Infusions:  cisatracurium  (NIMBEX ) 200 mg in sodium chloride  0.9 % 100 mL (2 mg/mL) infusion 4 mcg/kg/min (11/04/24 0900)   epoprostenol  ([name] ) for inhalation 1.5mg /50mL (30,000 ng/mL) 50 ng/kg/min (11/04/24 0957)   piperacillin -tazobactam (ZOSYN )  IV 12.5 mL/hr at 11/04/24 0900   propofol  (DIPRIVAN ) infusion 25 mcg/kg/min (11/04/24 0900)   PRN Meds: calcium  carbonate, polyethylene glycol  Labs Reviewed: Sodium 133, chloride 96 BUN 42, Creatinine 1.4 CBG ranges from 68-208 mg/dL over the last 24 hours  NUTRITION - FOCUSED PHYSICAL EXAM: Flowsheet Row Most Recent Value  Orbital Region No depletion  Upper Arm Region No depletion  Thoracic and Lumbar Region No depletion  Buccal Region No depletion  Temple Region No depletion  Clavicle Bone Region No depletion  Clavicle and Acromion Bone Region No depletion  Scapular Bone Region No depletion  Dorsal Hand No depletion  Patellar Region No depletion  Anterior Thigh Region No depletion  Posterior Calf Region No depletion  Edema (RD Assessment) Moderate  [BLE and hands]  Hair Reviewed  Eyes Reviewed  Mouth Reviewed  Skin Reviewed  Nails Reviewed    Diet Order:   Diet Order             Diet NPO time specified  Diet effective now                   EDUCATION NEEDS:  Education needs have been addressed  Skin:  Skin  Assessment: Reviewed RN Assessment  Last BM:  12/16 - type 7  Height:  Ht Readings from Last 1 Encounters:  10/31/24 4' 11 (1.499 m)    Weight:  Wt Readings from Last 1 Encounters:  11/04/24 53 kg    Ideal Body Weight:  44.7 kg  BMI:  Body mass index is 23.6 kg/m.  Estimated Nutritional Needs:  Kcal:  1100-1300 kcal/d Protein:  55-70 Fluid:  1.2L/d    Vernell Lukes, RD, LDN, CNSC Registered Dietitian II Please reach out via secure chat

## 2024-11-04 NOTE — Progress Notes (Signed)
 Patient's MAPs have been 60. Systolic blood pressures have been 113-115. Dr. Zaida notified, and is OK with MAP of 60.

## 2024-11-04 NOTE — Progress Notes (Signed)
 This chaplain responded to Dr Edrick consult and Chaplain Kubra referral for F/U spiritual care. The chaplain introduced herself to the Pt. mother, father, and sister in the waiting area. The chaplain began education on the role of Palliative Care.  The chaplain listened reflectively as the Pt. mother emotionally expressed the unknown in the Pt. diagnosis. The chaplain affirmed the family's bedside presence and relational expertise they bring to the Pt. care. The chaplain understands support is limited outside of the immediate family; the Pt. mother's family lives in [place]  and care for the Pt. is 24/7.   F/U spiritual care was accepted by the Pt. mother.  Chaplain Leeroy Hummer [phone]

## 2024-11-04 NOTE — Plan of Care (Signed)
" °  Problem: Nutrition: Goal: Adequate nutrition will be maintained Outcome: Progressing   Problem: Pain Managment: Goal: General experience of comfort will improve and/or be controlled Outcome: Progressing   Problem: Skin Integrity: Goal: Risk for impaired skin integrity will decrease Outcome: Progressing   Problem: Clinical Measurements: Goal: Ability to maintain clinical measurements within normal limits will improve Outcome: Not Progressing   "

## 2024-11-04 NOTE — Progress Notes (Signed)
 Pt placed in supine position.  ETT secured with commercial tube holder at 18 cm at the lip.

## 2024-11-04 NOTE — Plan of Care (Signed)
" °  Problem: Education: Goal: Knowledge of General Education information will improve Description: Including pain rating scale, medication(s)/side effects and non-pharmacologic comfort measures Outcome: Progressing   Problem: Pain Managment: Goal: General experience of comfort will improve and/or be controlled Outcome: Progressing   Problem: Clinical Measurements: Goal: Ability to maintain clinical measurements within normal limits will improve Outcome: Not Progressing Goal: Will remain free from infection Outcome: Not Progressing Goal: Diagnostic test results will improve Outcome: Not Progressing Goal: Respiratory complications will improve Outcome: Not Progressing Goal: Cardiovascular complication will be avoided Outcome: Not Progressing   Problem: Activity: Goal: Risk for activity intolerance will decrease Outcome: Not Progressing   Problem: Nutrition: Goal: Adequate nutrition will be maintained Outcome: Not Progressing   Problem: Elimination: Goal: Will not experience complications related to bowel motility Outcome: Not Progressing Goal: Will not experience complications related to urinary retention Outcome: Not Progressing   Problem: Safety: Goal: Ability to remain free from injury will improve Outcome: Not Progressing   Problem: Skin Integrity: Goal: Risk for impaired skin integrity will decrease Outcome: Not Progressing   Problem: Activity: Goal: Ability to tolerate increased activity will improve Outcome: Not Progressing   Problem: Respiratory: Goal: Ability to maintain a clear airway and adequate ventilation will improve Outcome: Not Progressing   Problem: Role Relationship: Goal: Method of communication will improve Outcome: Not Progressing   "

## 2024-11-04 NOTE — Procedures (Signed)
 Arterial Catheter Insertion Procedure Note  Jo Matthews  [id number]  Aug 23, 1996  Date:11/04/2024  Time:5:58 PM    Provider Performing: Zola SAILOR Jo Matthews    Procedure: Insertion of Arterial Line (63379) with US  guidance (23062)   Indication(s) Blood pressure monitoring and/or need for frequent ABGs  Consent Risks of the procedure as well as the alternatives and risks of each were explained to the patient and/or caregiver.  Consent for the procedure was obtained and is signed in the bedside chart  Anesthesia None   Time Out Verified patient identification, verified procedure, site/side was marked, verified correct patient position, special equipment/implants available, medications/allergies/relevant history reviewed, required imaging and test results available.   Sterile Technique Maximal sterile technique including full sterile barrier drape, hand hygiene, sterile gown, sterile gloves, mask, hair covering, sterile ultrasound probe cover (if used).   Procedure Description Area of catheter insertion was cleaned with chlorhexidine  and draped in sterile fashion. With real-time ultrasound guidance an arterial catheter was placed into the right radial artery.  Appropriate arterial tracings confirmed on monitor.     Complications/Tolerance None; patient tolerated the procedure well.   EBL Minimal   Specimen(s) None

## 2024-11-04 NOTE — Progress Notes (Addendum)
 "  NAME:  Jo Matthews, MRN:  [id number], DOB:  03/19/1996, LOS: 4 ADMISSION DATE:  10/31/2024, CONSULTATION DATE: 12/15 REFERRING MD: Dr. Tobie IM TS, CHIEF COMPLAINT: Shortness of breath  History of Present Illness:  28 year old female with past medical history significant for Rett syndrome, wheelchair/bedbound, nonverbal at baseline, chronic respiratory failure with hypoxia on 2 L, PEG tube dependent, and seizure history on multiple AEDs.  She was in her usual state of health until 12/14 when she had 1 episode of hypoxia (wears pulse ox at home).  This recovered spontaneously and she was again doing fine until 1215 early a.m. when she was increasingly lethargic as well as hypoxic with sats in the 70s and 80s.  Also had new onset fever.  Imaging with concern for pneumonia and she was admitted to the internal medicine teaching service for management thereof.  Vancomycin  and doxycycline  were initiated.  While still in the ED she became increasingly hypoxic requiring up titration of heated high flow nasal cannula to 80% FiO2 and 40 L/min.  Despite this she remained in the high 80s to low 90s and PCCM was asked to evaluate for ICU care.  Pertinent  Medical History   has a past medical history of Fracture, humerus closed, G tube feedings (HCC), Pneumonia, Recurrent fever of unknown cause (08/31/2012), Rett's syndrome, Scoliosis, Seizure disorder (HCC), Seizures (HCC), and Weight loss.   Significant Hospital Events: Including procedures, antibiotic start and stop dates in addition to other pertinent events   12/15 admit to ICU for respiratory failure, intubated in the ICU, on CTX 11/01/2024 remains mechanically intubated, 12/17 Versed  stopped, bronch w/ BAL 12/18 Changed to Zosyn , prone patient   Interim History / Subjective:  Remains unresponsive, ventilated, mother at bedside. Peak pressures remain elevated. Prone position.  Objective    Blood pressure (!) 128/98, pulse 96, temperature 97.9 F  (36.6 C), temperature source Axillary, resp. rate (!) 26, height 4' 11 (1.499 m), weight 53 kg, SpO2 98%.    Vent Mode: PRVC FiO2 (%):  [70 %-100 %] 80 % Set Rate:  [26 bmp] 26 bmp Vt Set:  [270 mL-320 mL] 300 mL PEEP:  [10 cmH20-16 cmH20] 16 cmH20 Plateau Pressure:  [36 cmH20-39 cmH20] 39 cmH20   Intake/Output Summary (Last 24 hours) at 11/04/2024 0650 Last data filed at 11/04/2024 0500 Gross per 24 hour  Intake 1207.48 ml  Output 500 ml  Net 707.48 ml   Filed Weights   11/02/24 0500 11/03/24 0427 11/04/24 0500  Weight: 50.4 kg 53 kg 53 kg    Examination:  General: Young female with Rett syndrome, has lower extremity contractures HENT: Normocephalic/atraumatic w/ EEG leads Lungs: Mechanically ventilated, coarse breath sounds Cardiovascular: RRR Abdomen: Soft, nontender, nondistended Extremities: Warm, trace LE edema, chronic lower extremity contractures Neuro: Poorly responsive, not moving spontaneously  Resolved problem list   Assessment and Plan   Neuro # Rett syndrome # Epilepsy with breakthrough seizures in the setting of infection - 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 Stable/unchanged EEG findings.  - Continue AEDs including Depakote , clonazepam , cannabidiol , Keppra , perampanel  - Appreciate neurology recommendations - On cisatracurium , propofol  and fentanyl , Versed  stopped  - Will need dosage adjustments based on kidney function  - Consider CT head today   Pulm # Acute hypoxic respiratory failure -currently intubated # Community-acquired pneumonia Bronchoscopy done 12/17. Remains mechanically ventilated. Peak pressures still high. CT chest showed diffuse opacities w/ bronchograms in right lung c/f PNA. Still no improvement  on imaging and vent settings, prone patient yesterday. -Changed to Dexamethasone  20 mg daily  -Changed to Zosyn  12/18 from CTX -Full vent support -LTVV, 4-8cc/kg IBW with goal  Pplat<30 and DP<15 -Daily evaluation for SBT and extubation -F/u 12/17 BAL (no growth so far) -Repeat echo today to eval for heart strain or other changes, not been able to r/o PE  Cardiac/Vascular  #Hypotension (improved) Off pressors   GI Diet: Continue tube feeds GI PPX: Continue Pepcid   ID Community-acquired pneumonia Leukocytosis -vanc stopped as MRSA nares negative  -completed 3 days of doxycycline   -Changed CTX to Zosyn  to broaden coverage -RVP negative, blood cultures (so far NG)  Renal  #Acute kidney injury likely in the setting of sepsis # Mild hyponatremia - Negative renal US  - Monitor kidney function, adequately resuscitated net +5.5L - Pending AM BMP   Endo CBG down to 87, was on SSI-S, decrease sensitivity if further below goal then d/c SSI  Heme/Onc Chronic macrocytic anemia Thrombocytopenia Hgb steady at 9.   DVT ppx : heparin  SQ  MSK/other  Chronic contractures   Labs   CBC: Recent Labs  Lab 10/31/24 0639 10/31/24 0649 10/31/24 1355 11/01/24 0256 11/03/24 0651 11/03/24 2016 11/04/24 0015  WBC 2.3*  --   --  13.2*  --   --   --   NEUTROABS 1.5*  --   --  6.9  --   --   --   HGB 11.2*   < > 8.5* 8.6* 8.2* 9.9* 9.2*  HCT 36.1   < > 25.0* 27.1* 24.0* 29.0* 27.0*  MCV 104.3*  --   --  105.0*  --   --   --   PLT 176  --   --  139*  --   --   --    < > = values in this interval not displayed.    Basic Metabolic Panel: Recent Labs  Lab 10/31/24 0639 10/31/24 0649 10/31/24 0650 10/31/24 1412 11/01/24 0256 11/02/24 0249 11/02/24 1011 11/03/24 0437 11/03/24 0651 11/03/24 2016 11/04/24 0015  NA 138 137   < >  --  134*  --  130* 132* 130* 133* 134*  K 3.2* 2.9*   < >  --  3.7  --  4.0 3.3* 3.2* 4.0 4.0  CL 90* 88*  --   --  96*  --  94* 94*  --   --   --   CO2 36*  --   --   --  26  --  20* 22  --   --   --   GLUCOSE 142* 147*  --   --  127*  --  80 213*  --   --   --   BUN 9 11  --   --  18  --  30* 35*  --   --   --   CREATININE  0.42* 0.50  --   --  1.05*  --  1.27* 1.48*  --   --   --   CALCIUM  9.4  --   --   --  8.3*  --  8.6* 9.1  --   --   --   MG  --   --   --  1.7 1.3* 3.7*  --  3.4*  --   --   --   PHOS  --   --   --  1.9* 1.8* 5.8*  --  5.7*  --   --   --    < > =  values in this interval not displayed.   GFR: Estimated Creatinine Clearance: 42.1 mL/min (A) (by C-G formula based on SCr of 1.48 mg/dL (H)). Recent Labs  Lab 10/31/24 0639 10/31/24 0649 10/31/24 0907 11/01/24 0256  WBC 2.3*  --   --  13.2*  LATICACIDVEN  --  1.6 2.2*  --     Liver Function Tests: Recent Labs  Lab 10/31/24 0639 11/01/24 0256  AST 38 51*  ALT 19 21  ALKPHOS 60 41  BILITOT 0.7 0.8  PROT 6.6 5.0*  ALBUMIN 2.7* 1.9*   No results for input(s): LIPASE, AMYLASE in the last 168 hours. No results for input(s): AMMONIA in the last 168 hours.  ABG    Component Value Date/Time   PHART 7.240 (L) 11/04/2024 0015   PCO2ART 61.2 (H) 11/04/2024 0015   PO2ART 179 (H) 11/04/2024 0015   HCO3 26.4 11/04/2024 0015   TCO2 28 11/04/2024 0015   ACIDBASEDEF 2.0 11/04/2024 0015   O2SAT 99 11/04/2024 0015     Coagulation Profile: Recent Labs  Lab 10/31/24 0639  INR 0.9    Cardiac Enzymes: No results for input(s): CKTOTAL, CKMB, CKMBINDEX, TROPONINI in the last 168 hours.  HbA1C: No results found for: HGBA1C  CBG: Recent Labs  Lab 11/03/24 1113 11/03/24 1523 11/03/24 1926 11/03/24 2325 11/04/24 0332  GLUCAP 174* 192* 133* 123* 87    Review of Systems:   Patient is encephalopathic and/or intubated; therefore, history has been obtained from chart review.   Past Medical History:  She,  has a past medical history of Fracture, humerus closed, G tube feedings (HCC), Pneumonia, Recurrent fever of unknown cause (08/31/2012), Rett's syndrome, Scoliosis, Seizure disorder (HCC), Seizures (HCC), and Weight loss.   Surgical History:   Past Surgical History:  Procedure Laterality Date   BACK SURGERY      BRONCHIAL WASHINGS  10/16/2023   Procedure: BRONCHIAL WASHINGS;  Surgeon: Neda Jennet LABOR, MD;  Location: [facility];  Service: Endoscopy;;   GASTROSTOMY TUBE PLACEMENT     SPINAL GROWTH RODS     SPINE SURGERY N/A    [employer] 02/14/2021   VIDEO BRONCHOSCOPY Left 10/16/2023   Procedure: VIDEO BRONCHOSCOPY WITHOUT FLUORO;  Surgeon: Neda Jennet LABOR, MD;  Location: [facility];  Service: Endoscopy;  Laterality: Left;  mucus plugging     Social History:   reports that she has never smoked. She has never used smokeless tobacco. She reports that she does not drink alcohol  and does not use drugs.   Family History:  Her family history includes Diabetes in an other family member; Heart disease in an other family member; Hyperlipidemia in an other family member.   Allergies Allergies[1]   Home Medications  Prior to Admission medications  Medication Sig Start Date End Date Taking? Authorizing Provider  albuterol  (PROVENTIL ) (2.5 MG/3ML) 0.083% nebulizer solution Take 3 mLs (2.5 mg total) by nebulization every 6 (six) hours as needed for wheezing or shortness of breath. 03/29/24  Yes Ramgoolam, Gustav, MD  Calcium  Carbonate Antacid (CALCIUM  CARBONATE, DOSED IN MG ELEMENTAL CALCIUM ,) 1250 MG/5ML SUSP Place 2 mLs (200 mg of elemental calcium  total) into feeding tube 3 (three) times daily as needed for indigestion. 05/04/21  Yes Cheryle Page, MD  cannabidiol  (EPIDIOLEX ) 100 MG/ML solution Place 350 mg into feeding tube in the morning and at bedtime. 03/11/21  Yes [provider]  clonazePAM  (KLONOPIN ) 0.25 MG disintegrating tablet TAKE 1 TAB BY MOUTH IN MORNING & 2 TABS AT NIGHT WITH 15 EXTRA FOR [place]  SEIZURE FOR 30 DAYS 06/03/24 10/31/24 Yes Ramgoolam, Andres, MD  diazepam  (DIASAT) 20 MG GEL Place 12.5 mg rectally as needed (for a seizure lasting more than 5 minutes). 09/18/21  Yes [provider]  divalproex  (DEPAKOTE  SPRINKLE) 125 MG capsule 375-500 mg See admin  instructions. Place 375 mg into the tube in the morning & afternoon and 500 mg at bedtime   Yes [provider]  FYCOMPA  0.5 MG/ML SUSP Place 2 mg into feeding tube at bedtime.   Yes [provider]  guaiFENesin  (ROBITUSSIN CHEST CONGESTION PO) Place 10 mLs into feeding tube daily as needed (cough).   Yes [provider]  ibuprofen  (ADVIL ) 100 MG/5ML suspension Place 300 mg into feeding tube every 4 (four) hours as needed for mild pain.   Yes [provider]  levETIRAcetam  (KEPPRA ) 100 MG/ML solution Place 2,000-2,250 mg into feeding tube See admin instructions. Place 2,000 mg (20 ml's) into the tube in the morning and 2,250 mg (22.5 ml's) at bedtime 01/08/18  Yes [provider]  levocetirizine (XYZAL ) 2.5 MG/5ML solution Take 10 mLs (5 mg total) by mouth every evening. 03/10/24 10/31/24 Yes Ramgoolam, Gustav, MD  Multiple Vitamin (MULTIVITAMIN PO) Give 1 tablet by tube daily.   Yes [provider]  mupirocin  ointment (BACTROBAN ) 2 % APPLY TO AFFECTED AREA 3 TIMES A DAY Patient taking differently: Apply 1 Application topically 2 (two) times daily as needed. 03/10/24  Yes Ramgoolam, Andres, MD  polyethylene glycol powder (GLYCOLAX /MIRALAX ) 17 GM/SCOOP powder Place 255 g into feeding tube daily as needed for mild constipation. Patient taking differently: Place 8.5-17 g into feeding tube daily as needed for mild constipation (mix as directed). 05/04/21  Yes Cheryle Page, MD  sodium chloride  HYPERTONIC 3 % nebulizer solution Take by nebulization as needed for other (as needed to loosen cough, congestion,  two or three times a day). 10/18/23  Yes Pokhrel, Laxman, MD  lacosamide  (VIMPAT ) 50 MG TABS tablet Take by mouth. Patient not taking: No sig reported 06/17/18 05/01/21  [provider]     Signature: Ozell Nearing, DO Internal Medicine Resident PGY-3 11/04/2024 6:50 AM        [1]  Allergies Allergen Reactions   Cephalexin  Other (See  Comments)    Seizures    Clobazam  Shortness Of Breath and Other (See Comments)    Severe side effects = Respiratory distress   Penicillins Shortness Of Breath and Other (See Comments)    Respiratory distress   "

## 2024-11-04 NOTE — Progress Notes (Signed)
Pt transported to and from CT scan on the ventilator. 

## 2024-11-04 NOTE — Progress Notes (Signed)
 LTM maint complete - no skin breakdown under: Cz, Pz, O2 All impedances under 10

## 2024-11-04 NOTE — Progress Notes (Signed)
 LTM lead repair complete - no skin breakdown under: GRND

## 2024-11-04 NOTE — Progress Notes (Addendum)
 Subjective: Continues to be on propofol .  Discussed with ICU team, due to ARDS.   ROS: Unable to obtain due to poor mental status  Examination  Vital signs in last 24 hours: Temp:  [97.1 F (36.2 C)-98.8 F (37.1 C)] 97.1 F (36.2 C) (12/19 0754) Pulse Rate:  [79-110] 98 (12/19 0900) Resp:  [0-45] 7 (12/19 0900) BP: (93-141)/(64-105) 126/82 (12/19 0900) SpO2:  [81 %-100 %] 99 % (12/19 0903) FiO2 (%):  [60 %-100 %] 60 % (12/19 0903) Weight:  [53 kg] 53 kg (12/19 0500)  General: lying in bed, not in apparent distress, intubated Neuro: Comatose, does not open eyes to noxious stimuli, does not follow commands, pupils equally round and reactive to light, corneal reflex absent, cough reflex absent, does not withdraw to noxious stimuli  Basic Metabolic Panel: Recent Labs  Lab 10/31/24 0639 10/31/24 0649 10/31/24 0650 10/31/24 1412 11/01/24 0256 11/02/24 0249 11/02/24 1011 11/03/24 0437 11/03/24 0651 11/03/24 2016 11/04/24 0015 11/04/24 0648 11/04/24 0816  NA 138 137   < >  --  134*  --  130* 132* 130* 133* 134* 135 133*  K 3.2* 2.9*   < >  --  3.7  --  4.0 3.3* 3.2* 4.0 4.0 4.2 3.7  CL 90* 88*  --   --  96*  --  94* 94*  --   --   --  96*  --   CO2 36*  --   --   --  26  --  20* 22  --   --   --  24  --   GLUCOSE 142* 147*  --   --  127*  --  80 213*  --   --   --  72  --   BUN 9 11  --   --  18  --  30* 35*  --   --   --  42*  --   CREATININE 0.42* 0.50  --   --  1.05*  --  1.27* 1.48*  --   --   --  1.40*  --   CALCIUM  9.4  --   --   --  8.3*  --  8.6* 9.1  --   --   --  10.2  --   MG  --   --   --  1.7 1.3* 3.7*  --  3.4*  --   --   --   --   --   PHOS  --   --   --  1.9* 1.8* 5.8*  --  5.7*  --   --   --   --   --    < > = values in this interval not displayed.    CBC: Recent Labs  Lab 10/31/24 0639 10/31/24 0649 11/01/24 0256 11/03/24 0651 11/03/24 2016 11/04/24 0015 11/04/24 0648 11/04/24 0816  WBC 2.3*  --  13.2*  --   --   --  23.8*  --   NEUTROABS 1.5*   --  6.9  --   --   --   --   --   HGB 11.2*   < > 8.6* 8.2* 9.9* 9.2* 9.3* 9.5*  HCT 36.1   < > 27.1* 24.0* 29.0* 27.0* 29.5* 28.0*  MCV 104.3*  --  105.0*  --   --   --  102.8*  --   PLT 176  --  139*  --   --   --  111*  --    < > =  values in this interval not displayed.     Coagulation Studies: No results for input(s): LABPROT, INR in the last 72 hours.  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  Admitted in ICU due to concern for pneumonia, currently on antibiotics.  Routine EEG was done which showed generalized epileptiform discharges and therefore she was started on long-term EEG.   Intractable epilepsy -Reducing Keppra  to 1000 mg twice daily due to worsening renal function - Continue current dose of rest of the antiseizure medications  - Of note patient is on propofol  for respiratory reasons.  She remains on LTM per family's request although we are not actively adjusting medications based on EEG -CT head today due to persistent encephalopathy and lack of meaningful neuroexam partly due to sedation. -Discussed plan with ICU team via secure chat - Defer further management of comorbidities per primary team - Discussed plan with mother at bedside      I personally spent a total of 36 minutes in the care of the patient today including getting/reviewing separately obtained history, performing a medically appropriate exam/evaluation, counseling and educating, placing orders, referring and communicating with other health care professionals, documenting  clinical information in the EHR, independently interpreting results, and coordinating care.    Arlin Krebs Epilepsy [facility] For questions after 5pm please refer to AMION to reach the Neurologist on call

## 2024-11-05 ENCOUNTER — Inpatient Hospital Stay (HOSPITAL_COMMUNITY)

## 2024-11-05 ENCOUNTER — Encounter (HOSPITAL_COMMUNITY): Payer: Self-pay | Admitting: Pulmonary Disease

## 2024-11-05 DIAGNOSIS — Z515 Encounter for palliative care: Secondary | ICD-10-CM

## 2024-11-05 DIAGNOSIS — G40919 Epilepsy, unspecified, intractable, without status epilepticus: Secondary | ICD-10-CM | POA: Diagnosis not present

## 2024-11-05 DIAGNOSIS — D72829 Elevated white blood cell count, unspecified: Secondary | ICD-10-CM

## 2024-11-05 DIAGNOSIS — Z151 Genetic susceptibility to epilepsy and neurodevelopmental disorders: Secondary | ICD-10-CM

## 2024-11-05 DIAGNOSIS — Z7189 Other specified counseling: Secondary | ICD-10-CM

## 2024-11-05 DIAGNOSIS — Z66 Do not resuscitate: Secondary | ICD-10-CM

## 2024-11-05 LAB — POCT I-STAT 7, (LYTES, BLD GAS, ICA,H+H)
Acid-Base Excess: 0 mmol/L (ref 0.0–2.0)
Acid-Base Excess: 0 mmol/L (ref 0.0–2.0)
Acid-base deficit: 1 mmol/L (ref 0.0–2.0)
Acid-base deficit: 2 mmol/L (ref 0.0–2.0)
Acid-base deficit: 1 mmol/L (ref 0.0–2.0)
Bicarbonate: 26.6 mmol/L (ref 20.0–28.0)
Bicarbonate: 26.1 mmol/L (ref 20.0–28.0)
Bicarbonate: 25 mmol/L (ref 20.0–28.0)
Bicarbonate: 26.8 mmol/L (ref 20.0–28.0)
Bicarbonate: 25.9 mmol/L (ref 20.0–28.0)
Calcium, Ion: 1.32 mmol/L (ref 1.15–1.40)
Calcium, Ion: 1.29 mmol/L (ref 1.15–1.40)
Calcium, Ion: 1.28 mmol/L (ref 1.15–1.40)
Calcium, Ion: 1.3 mmol/L (ref 1.15–1.40)
Calcium, Ion: 1.26 mmol/L (ref 1.15–1.40)
HCT: 22 % — ABNORMAL LOW (ref 36.0–46.0)
HCT: 24 % — ABNORMAL LOW (ref 36.0–46.0)
HCT: 22 % — ABNORMAL LOW (ref 36.0–46.0)
HCT: 21 % — ABNORMAL LOW (ref 36.0–46.0)
HCT: 20 % — ABNORMAL LOW (ref 36.0–46.0)
Hemoglobin: 7.5 g/dL — ABNORMAL LOW (ref 12.0–15.0)
Hemoglobin: 8.2 g/dL — ABNORMAL LOW (ref 12.0–15.0)
Hemoglobin: 7.5 g/dL — ABNORMAL LOW (ref 12.0–15.0)
Hemoglobin: 7.1 g/dL — ABNORMAL LOW (ref 12.0–15.0)
Hemoglobin: 6.8 g/dL — CL (ref 12.0–15.0)
O2 Saturation: 95 %
O2 Saturation: 96 %
O2 Saturation: 93 %
O2 Saturation: 94 %
O2 Saturation: 95 %
Patient temperature: 98.6
Patient temperature: 98.6
Patient temperature: 98.6
Patient temperature: 98.4
Patient temperature: 98.7
Potassium: 3.3 mmol/L — ABNORMAL LOW (ref 3.5–5.1)
Potassium: 3.9 mmol/L (ref 3.5–5.1)
Potassium: 4.2 mmol/L (ref 3.5–5.1)
Potassium: 4.4 mmol/L (ref 3.5–5.1)
Potassium: 4.2 mmol/L (ref 3.5–5.1)
Sodium: 133 mmol/L — ABNORMAL LOW (ref 135–145)
Sodium: 134 mmol/L — ABNORMAL LOW (ref 135–145)
Sodium: 133 mmol/L — ABNORMAL LOW (ref 135–145)
Sodium: 137 mmol/L (ref 135–145)
Sodium: 136 mmol/L (ref 135–145)
TCO2: 28 mmol/L (ref 22–32)
TCO2: 28 mmol/L (ref 22–32)
TCO2: 27 mmol/L (ref 22–32)
TCO2: 29 mmol/L (ref 22–32)
TCO2: 27 mmol/L (ref 22–32)
pCO2 arterial: 55.8 mmHg — ABNORMAL HIGH (ref 32–48)
pCO2 arterial: 58.1 mmHg — ABNORMAL HIGH (ref 32–48)
pCO2 arterial: 54.9 mmHg — ABNORMAL HIGH (ref 32–48)
pCO2 arterial: 58.9 mmHg — ABNORMAL HIGH (ref 32–48)
pCO2 arterial: 52.7 mmHg — ABNORMAL HIGH (ref 32–48)
pH, Arterial: 7.286 — ABNORMAL LOW (ref 7.35–7.45)
pH, Arterial: 7.261 — ABNORMAL LOW (ref 7.35–7.45)
pH, Arterial: 7.266 — ABNORMAL LOW (ref 7.35–7.45)
pH, Arterial: 7.265 — ABNORMAL LOW (ref 7.35–7.45)
pH, Arterial: 7.299 — ABNORMAL LOW (ref 7.35–7.45)
pO2, Arterial: 85 mmHg (ref 83–108)
pO2, Arterial: 92 mmHg (ref 83–108)
pO2, Arterial: 77 mmHg — ABNORMAL LOW (ref 83–108)
pO2, Arterial: 85 mmHg (ref 83–108)
pO2, Arterial: 83 mmHg (ref 83–108)

## 2024-11-05 LAB — GLUCOSE, CAPILLARY
Glucose-Capillary: 112 mg/dL — ABNORMAL HIGH (ref 70–99)
Glucose-Capillary: 139 mg/dL — ABNORMAL HIGH (ref 70–99)
Glucose-Capillary: 162 mg/dL — ABNORMAL HIGH (ref 70–99)
Glucose-Capillary: 183 mg/dL — ABNORMAL HIGH (ref 70–99)
Glucose-Capillary: 190 mg/dL — ABNORMAL HIGH (ref 70–99)
Glucose-Capillary: 196 mg/dL — ABNORMAL HIGH (ref 70–99)

## 2024-11-05 LAB — CBC
HCT: 24 % — ABNORMAL LOW (ref 36.0–46.0)
HCT: 23.6 % — ABNORMAL LOW (ref 36.0–46.0)
Hemoglobin: 7.7 g/dL — ABNORMAL LOW (ref 12.0–15.0)
Hemoglobin: 7.6 g/dL — ABNORMAL LOW (ref 12.0–15.0)
MCH: 32.6 pg (ref 26.0–34.0)
MCH: 32.1 pg (ref 26.0–34.0)
MCHC: 32.1 g/dL (ref 30.0–36.0)
MCHC: 32.2 g/dL (ref 30.0–36.0)
MCV: 101.7 fL — ABNORMAL HIGH (ref 80.0–100.0)
MCV: 99.6 fL (ref 80.0–100.0)
Platelets: 86 K/uL — ABNORMAL LOW (ref 150–400)
Platelets: 88 K/uL — ABNORMAL LOW (ref 150–400)
RBC: 2.36 MIL/uL — ABNORMAL LOW (ref 3.87–5.11)
RBC: 2.37 MIL/uL — ABNORMAL LOW (ref 3.87–5.11)
RDW: 13.9 % (ref 11.5–15.5)
RDW: 13.9 % (ref 11.5–15.5)
WBC: 23.8 K/uL — ABNORMAL HIGH (ref 4.0–10.5)
WBC: 29.1 K/uL — ABNORMAL HIGH (ref 4.0–10.5)
nRBC: 0.1 % (ref 0.0–0.2)
nRBC: 0.1 % (ref 0.0–0.2)

## 2024-11-05 LAB — BASIC METABOLIC PANEL WITH GFR
Anion gap: 15 (ref 5–15)
Anion gap: 17 — ABNORMAL HIGH (ref 5–15)
BUN: 46 mg/dL — ABNORMAL HIGH (ref 6–20)
BUN: 49 mg/dL — ABNORMAL HIGH (ref 6–20)
CO2: 25 mmol/L (ref 22–32)
CO2: 23 mmol/L (ref 22–32)
Calcium: 9.7 mg/dL (ref 8.9–10.3)
Calcium: 9.8 mg/dL (ref 8.9–10.3)
Chloride: 94 mmol/L — ABNORMAL LOW (ref 98–111)
Chloride: 93 mmol/L — ABNORMAL LOW (ref 98–111)
Creatinine, Ser: 1.48 mg/dL — ABNORMAL HIGH (ref 0.44–1.00)
Creatinine, Ser: 1.58 mg/dL — ABNORMAL HIGH (ref 0.44–1.00)
GFR, Estimated: 49 mL/min — ABNORMAL LOW
GFR, Estimated: 45 mL/min — ABNORMAL LOW
Glucose, Bld: 169 mg/dL — ABNORMAL HIGH (ref 70–99)
Glucose, Bld: 196 mg/dL — ABNORMAL HIGH (ref 70–99)
Potassium: 3.4 mmol/L — ABNORMAL LOW (ref 3.5–5.1)
Potassium: 4.3 mmol/L (ref 3.5–5.1)
Sodium: 134 mmol/L — ABNORMAL LOW (ref 135–145)
Sodium: 134 mmol/L — ABNORMAL LOW (ref 135–145)

## 2024-11-05 LAB — CULTURE, RESPIRATORY W GRAM STAIN
Culture: NO GROWTH
Gram Stain: NONE SEEN

## 2024-11-05 LAB — TRIGLYCERIDES: Triglycerides: 517 mg/dL — ABNORMAL HIGH

## 2024-11-05 LAB — CULTURE, BLOOD (ROUTINE X 2)
Culture: NO GROWTH
Special Requests: ADEQUATE

## 2024-11-05 MED ORDER — POTASSIUM CHLORIDE CRYS ER 20 MEQ PO TBCR
40.0000 meq | EXTENDED_RELEASE_TABLET | ORAL | Status: AC
  Administered 2024-11-05 (×2): 40 meq via ORAL
  Filled 2024-11-05 (×2): qty 2

## 2024-11-05 MED ORDER — FUROSEMIDE 10 MG/ML IJ SOLN
40.0000 mg | Freq: Once | INTRAMUSCULAR | Status: AC
  Administered 2024-11-05: 40 mg via INTRAVENOUS
  Filled 2024-11-05: qty 4

## 2024-11-05 MED ORDER — INSULIN ASPART 100 UNIT/ML IJ SOLN
0.0000 [IU] | INTRAMUSCULAR | Status: DC
  Administered 2024-11-05 (×2): 2 [IU] via SUBCUTANEOUS
  Administered 2024-11-06: 1 [IU] via SUBCUTANEOUS
  Filled 2024-11-05: qty 1
  Filled 2024-11-05: qty 2

## 2024-11-05 MED ORDER — FUROSEMIDE 10 MG/ML IJ SOLN
80.0000 mg | Freq: Once | INTRAMUSCULAR | Status: AC
  Administered 2024-11-05: 80 mg via INTRAVENOUS
  Filled 2024-11-05: qty 8

## 2024-11-05 NOTE — Plan of Care (Signed)
" °  Problem: Clinical Measurements: Goal: Ability to maintain clinical measurements within normal limits will improve Outcome: Progressing Goal: Will remain free from infection Outcome: Progressing Goal: Diagnostic test results will improve Outcome: Progressing Goal: Cardiovascular complication will be avoided Outcome: Progressing   Problem: Nutrition: Goal: Adequate nutrition will be maintained Outcome: Progressing   Problem: Safety: Goal: Ability to remain free from injury will improve Outcome: Progressing   Problem: Skin Integrity: Goal: Risk for impaired skin integrity will decrease Outcome: Progressing   Problem: Clinical Measurements: Goal: Respiratory complications will improve Outcome: Not Progressing   "

## 2024-11-05 NOTE — Progress Notes (Signed)
 eLink Physician-Brief Progress Note Patient Name: Sondi Desch DOB: 10-03-1996 MRN: [id number]   Date of Service  11/05/2024  HPI/Events of Note  CXR reviewed, ET tube in  the right main stem.  eICU Interventions  ET tube withdrawn with bilateral breath sounds, repeat CXR  for tube position.        Cashus Halterman U Stephannie Broner 11/05/2024, 6:58 AM

## 2024-11-05 NOTE — Progress Notes (Signed)
 eLink Physician-Brief Progress Note Patient Name: Jo Matthews DOB: [birth year] MRN: [id number]   Date of Service  11/05/2024  HPI/Events of Note  ET tube 9 cm above the carina.  eICU Interventions  RT instructed to advance the ET tube 6 cm.        Jarrad Mclees U Dale Strausser 11/05/2024, 5:37 AM

## 2024-11-05 NOTE — Progress Notes (Addendum)
 PCCM Progress Note  Called to bedside for hypoxemia to FIO2 70% PEEP 16. Concerned for inability to pass suction through the ETT. Emergent bronchoscopy performed with verbal consent from patient's father obtained. See bronchoscopy report. Extrinsic compression of ETT that was relieved with patient repositioning.   Continue to wean vent settings for goal SpO2 >88% CXR ordered for ETT withdrawal

## 2024-11-05 NOTE — Procedures (Signed)
"   Patient Name: Jo Matthews  MRN: [id number]  Epilepsy Attending: Arlin MALVA Krebs  Referring Physician/Provider: Krebs Arlin MALVA, MD  Duration: 11/04/2024 1005 to 11/05/2024 1005   Patient history: 28yo F patient with history of Rett syndrome, seizures, oxygen dependence here for evaluation of hypoxia. EEG to evaluate for seizure   Level of alertness: comatose   AEDs during EEG study: CBD, Clonazepam , VPA, LEV, Perampanel , propofol    Technical aspects: This EEG study was done with scalp electrodes positioned according to the 10-20 International system of electrode placement. Electrical activity was reviewed with band pass filter of 1-70Hz , sensitivity of 7 uV/mm, display speed of 39mm/sec with a 60Hz  notched filter applied as appropriate. EEG data were recorded continuously and digitally stored.  Video monitoring was available and reviewed as appropriate.   Description: EEG showed burst suppression pattern with bursts of generalized 3-6hz  theta-delta slowing admixed with generalized spikes lasting 2-3 seconds alternating with 5-9 seconds of generalized suppression. Hyperventilation and photic stimulation were not performed.      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 Additionally there is  profound diffuse encephalopathy, likely related to sedation.   Faun Mcqueen O Dalon Reichart  "

## 2024-11-05 NOTE — Progress Notes (Signed)
 Trouble with inline suction. RRT at bedside.   CCM called.

## 2024-11-05 NOTE — Plan of Care (Signed)

## 2024-11-05 NOTE — Progress Notes (Signed)
 LTM maintenance complete. Skin breakdown under Fp1, Fp2 - electrodes moved accordingly.

## 2024-11-05 NOTE — Progress Notes (Signed)
 Patient had an X-ray of the chest, ETT originally  18cm @ lip, CCM instructed to advance  ETT 6 cm,  Tube now currently is at 24cm @lip 

## 2024-11-05 NOTE — Progress Notes (Signed)
CCM at bedside

## 2024-11-05 NOTE — Progress Notes (Signed)
 Patient received another X-ray, ETT tip appears to be in right main stem , it was recommended by MD to retract 3cm, ETT is now 21cm , Patient has equal bilateral breath sounds and receiving her full Tidal Volume.

## 2024-11-05 NOTE — Consult Note (Signed)
 "                                                  Palliative Care Consult Note                                  Date: 11/05/2024   Patient Name: Jo Matthews  DOB: 09/17/1996  MRN: [id number]  Age / Sex: 28 y.o., female  PCP: Darrol Merck, MD Referring Physician: Kassie Acquanetta Bradley, MD  Reason for Consultation: Establishing goals of care  HPI/Patient Profile: Palliative Care consult requested for goals of care discussion in this 28 y.o. female  with past medical history of Rett syndrome, nonverbal at baseline, wheelchair/bedbound, PEG tube, chronic hypoxia (2L home oxygen), seizure disorder. She was admitted on 10/31/2024 from home with lethargy, hypoxia (70-80s), and temperature. During work-up patient became increasingly hypoxic on high flow nasal cannula later requiring intubation. Chest imaging concerned for pneumonia.   Past Medical History:  Diagnosis Date   Fracture, humerus closed    G tube feedings (HCC)    Pneumonia    Recurrent fever of unknown cause 08/31/2012   Fever to 104-105 lasting 24 hrs, occuring once a week for the past 5 weeks (onset Sept 2013)   Rett's syndrome    Scoliosis    Seizure disorder (HCC)    Seizures (HCC)    Weight loss      Subjective:   This NP Levon Freud reviewed medical records, received report from team, assessed the patient and then met at the patient's bedside with patient's mother, Jo Matthews to discuss diagnosis, prognosis, GOC, EOL wishes disposition and options.  Patient is nonverbal at baseline and currently intubated. Unable to engage in discussions.    Concept of Palliative Care was introduced as specialized medical care for people and their families living with serious illness.  It focuses on providing relief from the symptoms and stress of a serious illness.  The goal is to improve quality of life for both the patient and the family. Values and goals of care important to patient and family were attempted to be elicited.  I created  space and opportunity for patient's mother to explore state of health prior to admission, thoughts, and feelings.   Jo Matthews shares patient has been under her care 24/7 since birth. She has 2 siblings (older sister and younger brother). Spends every other weekend with her father.  She is non-verbal but is described as expressive with her eyes and generally happy, often smiling. She enjoys cartoons and has a favorite pillow.  At home patient has nurse caregivers during the day allowing her mother to work as needed. Total care assistance. Patient has a hospital bed, home oxygen, and hoyer lift for transfers.   She has a gastrostomy button placed due to a period of significant weight loss when she refused to eat. The button was primarily for medication administration as she would spit out oral medications. She continues to eat regular food but requires occasional supplementation.  We discussed Her current illness and what it means in the larger context of Her on-going co-morbidities. Natural disease trajectory and expectations were discussed.  [name] verbalized understanding of current illness and co-morbidities. She is realistic in her understanding and speaks to health challenges over the  years. Jo Matthews is emotional sharing feelings that this time feels differently! She is concerned that out of all the years of hospitalizations and sickness that none have been as severe and Mersedes typically bounce back within days which has not happened this time. She also will typically respond and has yet to do so. Emotional support provided.   We discussed at length best case and worst case scenario. Jo Matthews shares that both she and her father desires for [name] to suffer. She speaks to her great strength and pain tolerance. Mom becomes emotional sharing she is remaining hopeful for some improvement but also is prepared for the worst including no improvement and the need for difficult discussions leading to  comfort focused care allowing their daughter to naturally pass away in a comfortable state. Patient's father was emotional over the past week which is unusual for him. Emotional support provided.   Jo Matthews shares Jo Matthews's father and her is focused on her quality of life. With awareness that this may result in heartbreak at some point but again they will be at peace knowing she is not suffering. They would like to continue taking things one day at a time allowing time for outcomes. Mom prefers direct and open communication to allow her to have a full understanding and allow for informed decisions.   I discussed the importance of continued conversation with family and their medical providers regarding overall plan of care and treatment options, ensuring decisions are within the context of the patients values and GOCs.  Questions and concerns were addressed. The family was encouraged to call with questions or concerns.  PMT will continue to support holistically as needed.  Objective:   Primary Diagnoses: Present on Admission:  Sepsis (HCC)  Acute on chronic hypoxic respiratory failure (HCC)  Rett syndrome  Spastic quadriplegia (HCC)  Acute respiratory failure with hypoxia (HCC)   Scheduled Meds:  artificial tears  1 Application Both Eyes Q8H   cannabidiol   350 mg Per Tube BID   Chlorhexidine  Gluconate Cloth  6 each Topical Daily   clonazepam   0.25 mg Per Tube Daily   And   clonazepam   0.5 mg Per Tube QHS   dexamethasone  (DECADRON ) injection  20 mg Intravenous Daily   divalproex   375 mg Oral BID   And   divalproex   500 mg Oral QHS   feeding supplement (OSMOLITE 1.2 CAL)  1,000 mL Per Tube Q24H   heparin  injection (subcutaneous)  5,000 Units Subcutaneous Q12H   insulin  aspart  0-9 Units Subcutaneous Q4H   ipratropium-albuterol   3 mL Nebulization Q6H   levETIRAcetam   1,000 mg Intravenous Q12H   mouth rinse  15 mL Mouth Rinse Q2H   pantoprazole  (PROTONIX ) IV  40 mg Intravenous Q24H    Perampanel   4 mL Per Tube QHS   thiamine   100 mg Per Tube Daily    Continuous Infusions:  sodium chloride  Stopped (11/05/24 0546)   epoprostenol  ([name] ) for inhalation 1.5mg /50mL (30,000 ng/mL) 50 ng/kg/min (11/05/24 1033)   fentaNYL  infusion INTRAVENOUS 50 mcg/hr (11/05/24 0800)   piperacillin -tazobactam (ZOSYN )  IV 3.375 g (11/05/24 1333)   propofol  (DIPRIVAN ) infusion Stopped (11/04/24 1643)    PRN Meds: acetaminophen  **OR** acetaminophen , calcium  carbonate (dosed in mg elemental calcium ), fentaNYL , fentaNYL  (SUBLIMAZE ) injection, mouth rinse, polyethylene glycol  Allergies[1]  Review of Systems  Unable to perform ROS: Intubated    Physical Exam General: NAD Cardiovascular: regular rate and rhythm Pulmonary: diminished bilaterally  Extremities: no edema, no joint deformities Skin: no rashes,  warm and dry Neurological: intubated  Vital Signs:  BP 109/69 (BP Location: Right Arm)   Pulse 91   Temp (!) 96.8 F (36 C) (Axillary)   Resp (!) 22   Ht 4' 11 (1.499 m)   Wt 51.9 kg   SpO2 97%   BMI 23.11 kg/m  Pain Scale: CPOT    SpO2: SpO2: 97 % O2 Device:SpO2: 97 % O2 Flow Rate: .O2 Flow Rate (L/min): (S) 50 L/min  IO: Intake/output summary:  Intake/Output Summary (Last 24 hours) at 11/05/2024 1506 Last data filed at 11/05/2024 1200 Gross per 24 hour  Intake 904.63 ml  Output 1275 ml  Net -370.37 ml    LBM: Last BM Date : 11/05/24 Baseline Weight: Weight: 45.4 kg Most recent weight: Weight: 51.9 kg     NTUBATED    Advanced Care Planning:   Primary Decision Maker: NEXT OF KIN  Code Status/Advance Care Planning: DNR  A discussion was had today regarding advanced directives. Concepts specific to code status, artifical feeding and hydration, continued IV antibiotics and rehospitalization was had.  The difference between a aggressive medical intervention path and a palliative comfort care path was discussed.   We empathetically approached discussions.  Parents have made hard decision for DNR in the event of a cardiopulmonary event during this difficult time.   Assessment & Plan:   SUMMARY OF RECOMMENDATIONS   DNR/DNI-as confirmed by mother Continue with current plan of care allowing time for outcomes.  Extensive goals of care discussions. Mother is realistic in her understanding. She and father is remaining hopeful for improvement but also prepared for worst case meaning no meaningful improvement or recovery. They do not want her to suffer in any state and are prepared to make complex decisions as needed.  PMT will continue to support and engage in goals of care discussions. Please secure chat covering palliative provider with unmet palliative needs.   Symptom Management:  Per Attending  Palliative Prophylaxis:  Aspiration, Bowel Regimen, and Frequent Pain Assessment  Additional Recommendations (Limitations, Scope, Preferences): DNR, Continue to treat the treatable.   Psycho-social/Spiritual:  Desire for further Chaplaincy support: yes Additional Recommendations: Ongoing goals of care discussions and support.   Prognosis:  Unable to determine  Discharge Planning:  To Be Determined pending hospital trajectory.   Discussed with: Mother and RN.   Patient's Mother  expressed understanding and was in agreement with this plan.    Time Total: 75 min.   Visit consisted of counseling and education dealing with the complex and emotionally intense issues of symptom management and palliative care in the setting of serious and potentially life-threatening illness.  Signed by:  Levon Borer, AGPCNP-BC [facility]   Phone: [phone] Pager: [phone] Amion: GEANNIE Freud   Thank you for allowing the Palliative Medicine Team to assist in the care of this patient. Please utilize secure chat with additional questions, if there is no response within 30 minutes please call the above phone number.  Palliative Medicine Team providers are available by phone from 7am to 5pm daily and can be reached through the team cell phone.  Should this patient require assistance outside of these hours, please call the patient's attending physician.  *Please note that this is a verbal dictation therefore any spelling or grammatical errors are due to the Dragon [employer] One system interpretation.                  [1]  Allergies Allergen Reactions   Cephalexin  Other (  See Comments)    Seizures    Clobazam  Shortness Of Breath and Other (See Comments)    Severe side effects = Respiratory distress   "

## 2024-11-05 NOTE — Procedures (Signed)
 Bronchoscopy Procedure Note  Jo Matthews  [id number]  1996/09/30  Date:11/05/2024  Time:11:36 AM   Provider Performing:Sophiamarie Nease Slater Staff, MD  Procedure(s):  Flexible Bronchoscopy [phone])  Indication(s) Hypoxemia  Consent Risks of the procedure as well as the alternatives and risks of each were explained to the patient and/or caregiver.  Consent for the procedure was obtained and is signed in the bedside chart  Anesthesia Fentanyl    Time Out Verified patient identification, verified procedure, site/side was marked, verified correct patient position, special equipment/implants available, medications/allergies/relevant history reviewed, required imaging and test results available.   Sterile Technique Usual hand hygiene, masks, gowns, and gloves were used   Procedure Description and Findings Bronchoscope advanced through endotracheal tube and into airway. ETT with external compression. Patient repositioned and able to visualize airway down to subsegmental level. Thin clear secretions presents with no evidence of mucous plugging. Carina with minor trauma thought related to suctioning. No evidence of active bleeding. ETT pulled from 21 to 19 cm at the mouth.   Complications/Tolerance None; patient tolerated the procedure well. Chest X-ray is needed post procedure.   EBL Minimal   Specimen(s) None

## 2024-11-05 NOTE — Progress Notes (Signed)
 "  NAME:  Jo Matthews, MRN:  [id number], DOB:  07-10-1996, LOS: 5 ADMISSION DATE:  10/31/2024, CONSULTATION DATE: 12/15 REFERRING MD: Dr. Tobie IM TS, CHIEF COMPLAINT: Shortness of breath  History of Present Illness:  28 year old female with past medical history significant for Rett syndrome, wheelchair/bedbound, nonverbal at baseline, chronic respiratory failure with hypoxia on 2 L, PEG tube dependent, and seizure history on multiple AEDs.  She was in her usual state of health until 12/14 when she had 1 episode of hypoxia (wears pulse ox at home).  This recovered spontaneously and she was again doing fine until 1215 early a.m. when she was increasingly lethargic as well as hypoxic with sats in the 70s and 80s.  Also had new onset fever.  Imaging with concern for pneumonia and she was admitted to the internal medicine teaching service for management thereof.  Vancomycin  and doxycycline  were initiated.  While still in the ED she became increasingly hypoxic requiring up titration of heated high flow nasal cannula to 80% FiO2 and 40 L/min.  Despite this she remained in the high 80s to low 90s and PCCM was asked to evaluate for ICU care.  Pertinent  Medical History   has a past medical history of Fracture, humerus closed, G tube feedings (HCC), Pneumonia, Recurrent fever of unknown cause (08/31/2012), Rett's syndrome, Scoliosis, Seizure disorder (HCC), Seizures (HCC), and Weight loss.   Significant Hospital Events: Including procedures, antibiotic start and stop dates in addition to other pertinent events   12/15 admit to ICU for respiratory failure, intubated in the ICU, on CTX 11/01/2024 remains mechanically intubated, 12/17 Versed  stopped, bronch w/ BAL 12/18 Changed to Zosyn , prone patient   Interim History / Subjective:  Remains unresponsive, ventilated, mother at bedside. Peak pressures remain elevated. Prone position.  Objective    Blood pressure 109/69, pulse 95, temperature 97.6 F (36.4  C), temperature source Axillary, resp. rate (!) 22, height 4' 11 (1.499 m), weight 51.9 kg, SpO2 99%.    Vent Mode: PRVC FiO2 (%):  [7 %-100 %] 70 % Set Rate:  [22 bmp-26 bmp] 22 bmp Vt Set:  [300 mL-6300 mL] 6300 mL PEEP:  [16 cmH20] 16 cmH20 Plateau Pressure:  [31 cmH20-40 cmH20] 39 cmH20   Intake/Output Summary (Last 24 hours) at 11/05/2024 0902 Last data filed at 11/05/2024 0800 Gross per 24 hour  Intake 1255.67 ml  Output 1075 ml  Net 180.67 ml   Filed Weights   11/03/24 0427 11/04/24 0500 11/05/24 0500  Weight: 53 kg 53 kg 51.9 kg   Physical Exam: General: Young, chronically ill-appearing, sedated HENT: [place], AT, ETT in place, EEG leads I place Respiratory: Diminished to auscultation bilaterally.  No crackles, wheezing or rales Cardiovascular: RRR, -M/R/G, no JVD GI: BS+, soft, nontender Extremities: LE contractures, anasarca,-tenderness Neuro: Sedated, CNII-XII grossly intact GU: External foley in place  Imaging, labs and test in EMR in the last 24 hours reviewed independently by me. 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 Stable/unchanged EEG findings.  - CT head 12/19 NAICA - Continue AEDs including depakote , clonazepam , cannabidiol , Keppra , perampanel  - Appreciate neurology recommendations  - Off paralytics - On fentanyl   Pulm # Acute hypoxic respiratory failure/ARDS -currently intubated # Community-acquired pneumonia Bronchoscopy done 12/17. Remains mechanically ventilated. Peak pressures still high. CT chest showed diffuse opacities w/ bronchograms in right lung c/f PNA. Still no improvement on  imaging and vent settings, prone patient 12/18 -Dexamethasone  20 mg daily  -Broadened to Zosyn  12/18 -[name]  for refractory hypoxemia. End November 10, 2024 -Full vent support -LTVV, 4-8cc/kg IBW with goal Pplat<30 and DP<15 -SBT/WUA when able -F/u 12/17 BAL - NGTD -Diurese - ABG. If worsening P:F, will consider reproning  Cardiac/Vascular  #Hypotension - resolved Remains off pressors Echo with normal EF, normal RV function and size, no valvular abnormalities  GI Diet: Continue tube feeds GI PPX: Continue Pepcid   ID Community-acquired pneumonia Leukocytosis -vanc stopped as MRSA nares negative  -completed 3 days of doxycycline   -Zosyn  12/18 to broaden coverage -RVP negative, blood cultures (so far NG)  Renal  #Acute kidney injury likely in the setting of sepsis #Mild hyponatremia #Hypokalemia - Negative renal US  - Trend BMET - Replete potassium  Endo CBG down to 87, was on SSI-S, decrease sensitivity if further below goal then d/c SSI  Heme/Onc Chronic macrocytic anemia Thrombocytopenia Hgb steady at 9.   DVT ppx : heparin  SQ  MSK/other  Chronic contractures  Updated father at bedside  The patient is critically ill with multiple organ systems failure and requires high complexity decision making for assessment and support, frequent evaluation and titration of therapies, application of advanced monitoring technologies and extensive interpretation of multiple databases.  Independent Critical Care Time: 45 Minutes.   Slater Staff, M.D. [facility] Pulmonary/Critical Care Medicine 11/05/2024 9:02 AM   Please see Amion for pager number to reach on-call Pulmonary and Critical Care Team.   "

## 2024-11-05 NOTE — Progress Notes (Signed)
 Call received from radiologist at [facility].  Asked this nurse to confirm receipt and acknowledgement of his reading of PCXR.  Report states ETT is in R mainstem bronchus.  RT Rosina notified and is at bedside to retract tube per order.

## 2024-11-06 ENCOUNTER — Inpatient Hospital Stay (HOSPITAL_COMMUNITY)

## 2024-11-06 DIAGNOSIS — G40919 Epilepsy, unspecified, intractable, without status epilepticus: Secondary | ICD-10-CM | POA: Diagnosis not present

## 2024-11-06 DIAGNOSIS — J9612 Chronic respiratory failure with hypercapnia: Secondary | ICD-10-CM

## 2024-11-06 LAB — POCT I-STAT 7, (LYTES, BLD GAS, ICA,H+H)
Acid-Base Excess: 0 mmol/L (ref 0.0–2.0)
Acid-Base Excess: 1 mmol/L (ref 0.0–2.0)
Acid-base deficit: 1 mmol/L (ref 0.0–2.0)
Bicarbonate: 26.8 mmol/L (ref 20.0–28.0)
Bicarbonate: 27 mmol/L (ref 20.0–28.0)
Bicarbonate: 27.5 mmol/L (ref 20.0–28.0)
Calcium, Ion: 1.24 mmol/L (ref 1.15–1.40)
Calcium, Ion: 1.26 mmol/L (ref 1.15–1.40)
Calcium, Ion: 1.29 mmol/L (ref 1.15–1.40)
HCT: 29 % — ABNORMAL LOW (ref 36.0–46.0)
HCT: 53 % — ABNORMAL HIGH (ref 36.0–46.0)
HCT: 22 % — ABNORMAL LOW (ref 36.0–46.0)
Hemoglobin: 18 g/dL — ABNORMAL HIGH (ref 12.0–15.0)
Hemoglobin: 9.9 g/dL — ABNORMAL LOW (ref 12.0–15.0)
Hemoglobin: 7.5 g/dL — ABNORMAL LOW (ref 12.0–15.0)
O2 Saturation: 97 %
O2 Saturation: 98 %
O2 Saturation: 94 %
Patient temperature: 99.6
Patient temperature: 99.6
Patient temperature: 98.6
Potassium: 4 mmol/L (ref 3.5–5.1)
Potassium: 4 mmol/L (ref 3.5–5.1)
Potassium: 4 mmol/L (ref 3.5–5.1)
Sodium: 134 mmol/L — ABNORMAL LOW (ref 135–145)
Sodium: 135 mmol/L (ref 135–145)
Sodium: 135 mmol/L (ref 135–145)
TCO2: 28 mmol/L (ref 22–32)
TCO2: 29 mmol/L (ref 22–32)
TCO2: 29 mmol/L (ref 22–32)
pCO2 arterial: 55.5 mmHg — ABNORMAL HIGH (ref 32–48)
pCO2 arterial: 59.3 mmHg — ABNORMAL HIGH (ref 32–48)
pCO2 arterial: 53.2 mmHg — ABNORMAL HIGH (ref 32–48)
pH, Arterial: 7.269 — ABNORMAL LOW (ref 7.35–7.45)
pH, Arterial: 7.295 — ABNORMAL LOW (ref 7.35–7.45)
pH, Arterial: 7.321 — ABNORMAL LOW (ref 7.35–7.45)
pO2, Arterial: 100 mmHg (ref 83–108)
pO2, Arterial: 122 mmHg — ABNORMAL HIGH (ref 83–108)
pO2, Arterial: 77 mmHg — ABNORMAL LOW (ref 83–108)

## 2024-11-06 LAB — CBC
HCT: 21.8 % — ABNORMAL LOW (ref 36.0–46.0)
Hemoglobin: 7.3 g/dL — ABNORMAL LOW (ref 12.0–15.0)
MCH: 33 pg (ref 26.0–34.0)
MCHC: 33.5 g/dL (ref 30.0–36.0)
MCV: 98.6 fL (ref 80.0–100.0)
Platelets: 95 K/uL — ABNORMAL LOW (ref 150–400)
RBC: 2.21 MIL/uL — ABNORMAL LOW (ref 3.87–5.11)
RDW: 14.1 % (ref 11.5–15.5)
WBC: 35.9 K/uL — ABNORMAL HIGH (ref 4.0–10.5)
nRBC: 0.1 % (ref 0.0–0.2)

## 2024-11-06 LAB — BASIC METABOLIC PANEL WITH GFR
Anion gap: 16 — ABNORMAL HIGH (ref 5–15)
BUN: 56 mg/dL — ABNORMAL HIGH (ref 6–20)
CO2: 26 mmol/L (ref 22–32)
Calcium: 9.6 mg/dL (ref 8.9–10.3)
Chloride: 95 mmol/L — ABNORMAL LOW (ref 98–111)
Creatinine, Ser: 1.6 mg/dL — ABNORMAL HIGH (ref 0.44–1.00)
GFR, Estimated: 45 mL/min — ABNORMAL LOW
Glucose, Bld: 126 mg/dL — ABNORMAL HIGH (ref 70–99)
Potassium: 4.1 mmol/L (ref 3.5–5.1)
Sodium: 137 mmol/L (ref 135–145)

## 2024-11-06 LAB — HEMOGLOBIN A1C
Hgb A1c MFr Bld: 5.5 % (ref 4.8–5.6)
Mean Plasma Glucose: 111.15 mg/dL

## 2024-11-06 LAB — GLUCOSE, CAPILLARY
Glucose-Capillary: 127 mg/dL — ABNORMAL HIGH (ref 70–99)
Glucose-Capillary: 130 mg/dL — ABNORMAL HIGH (ref 70–99)

## 2024-11-06 MED ORDER — GLYCOPYRROLATE 1 MG PO TABS: 1.0000 mg | ORAL_TABLET | ORAL | Status: DC | PRN

## 2024-11-06 MED ORDER — SODIUM CHLORIDE 0.9 % IV SOLN: INTRAVENOUS | Status: DC

## 2024-11-06 MED ORDER — ACETAMINOPHEN 650 MG RE SUPP: 650.0000 mg | Freq: Four times a day (QID) | RECTAL | Status: DC | PRN

## 2024-11-06 MED ORDER — MORPHINE BOLUS VIA INFUSION
5.0000 mg | INTRAVENOUS | Status: DC | PRN
  Administered 2024-11-06 (×2): 5 mg via INTRAVENOUS

## 2024-11-06 MED ORDER — GLYCOPYRROLATE 0.2 MG/ML IJ SOLN: 0.2000 mg | INTRAMUSCULAR | Status: DC | PRN

## 2024-11-06 MED ORDER — MORPHINE 100MG IN NS 100ML (1MG/ML) PREMIX INFUSION
0.0000 mg/h | INTRAVENOUS | Status: DC
  Administered 2024-11-06: 5 mg/h via INTRAVENOUS
  Filled 2024-11-06: qty 100

## 2024-11-06 MED ORDER — MIDAZOLAM HCL (PF) 2 MG/2ML IJ SOLN
2.0000 mg | INTRAMUSCULAR | Status: DC | PRN
  Administered 2024-11-06: 2 mg via INTRAVENOUS
  Filled 2024-11-06: qty 2

## 2024-11-06 MED ORDER — POLYVINYL ALCOHOL 1.4 % OP SOLN: 1.0000 [drp] | Freq: Four times a day (QID) | OPHTHALMIC | Status: DC | PRN

## 2024-11-06 MED ORDER — ACETAMINOPHEN 325 MG PO TABS: 650.0000 mg | ORAL_TABLET | Freq: Four times a day (QID) | ORAL | Status: DC | PRN

## 2024-11-07 LAB — CULTURE, BLOOD (ROUTINE X 2)
Culture: NO GROWTH
Special Requests: ADEQUATE

## 2024-11-14 ENCOUNTER — Telehealth (HOSPITAL_BASED_OUTPATIENT_CLINIC_OR_DEPARTMENT_OTHER): Payer: Self-pay | Admitting: Pulmonary Disease

## 2024-11-14 NOTE — Telephone Encounter (Signed)
 Copied from CRM #[id number]. Topic: General - Deceased Patient >> 11/21/2024  8:48 AM Corean SAUNDERS wrote: Name of caller: Darleene Malling  Date of death: Nov 13, 2024  Name of funeral home: [facility]   Phone number of funeral home:  [phone]  Provider that needs to sign form: Dr. Kassie  Timeline for signing: ASAP

## 2024-11-14 NOTE — Telephone Encounter (Signed)
 Signature needed for [employer] to Provider.

## 2024-11-15 ENCOUNTER — Telehealth (HOSPITAL_BASED_OUTPATIENT_CLINIC_OR_DEPARTMENT_OTHER): Payer: Self-pay

## 2024-11-15 NOTE — Telephone Encounter (Unsigned)
 Copied from CRM [id number]. Topic: General - Other >> Nov 15, 2024  9:43 AM Essie A wrote: Reason for CRM: Darleene from [facility] called regarding a signing of a death certificate for patient.  Wanted to let you know that it's very urgent to get this signed because the family to waiting for the cremation and the funeral since the patient died on Nov 27, 2024.  Please return his call [phone].  Thanks.

## 2024-11-15 NOTE — Telephone Encounter (Signed)
Death cert done

## 2024-11-17 NOTE — Progress Notes (Signed)
 Pt extubated to comfort care at this time per family wishes

## 2024-11-17 NOTE — Progress Notes (Signed)
 eLink Physician-Brief Progress Note Patient Name: Jo Matthews DOB: February 05, 1996 MRN: [id number]   Date of Service  2024-11-08  HPI/Events of Note  Refractory hypoxemia, escalating PEEP and FiO2 today.  Primary team discussed proning with the family.  PF ratio 112  eICU Interventions  Return to pronation therapy per protocol     Intervention Category Major Interventions: Hypoxemia - evaluation and management  Lanson Randle 2024-11-08, 12:53 AM

## 2024-11-17 NOTE — Progress Notes (Addendum)
 Patient extubated per family request at 1:19 pm. Family at bedside.

## 2024-11-17 NOTE — Progress Notes (Signed)
 Patient belongings were returned to mother. She would like her home medication to be discarded.   Patient placement information was provided.

## 2024-11-17 NOTE — Progress Notes (Signed)
 A lock of hair and finger prints were requested by family.

## 2024-11-17 NOTE — Procedures (Signed)
 Patient Name: Jo Matthews  MRN: [id number]  Epilepsy Attending: Arlin MALVA Krebs  Referring Physician/Provider: Krebs Arlin MALVA, MD  Duration: 11/05/2024 1005 to 11/12/24 1005   Patient history: 28yo F patient with history of Rett syndrome, seizures, oxygen dependence here for evaluation of hypoxia. EEG to evaluate for seizure   Level of alertness: comatose   AEDs during EEG study: CBD, Clonazepam , VPA, LEV, Perampanel    Technical aspects: This EEG study was done with scalp electrodes positioned according to the 10-20 International system of electrode placement. Electrical activity was reviewed with band pass filter of 1-70Hz , sensitivity of 7 uV/mm, display speed of 77mm/sec with a 60Hz  notched filter applied as appropriate. EEG data were recorded continuously and digitally stored.  Video monitoring was available and reviewed as appropriate.   Description: EEG showed burst suppression pattern with bursts of generalized 3-6hz  theta-delta slowing admixed with generalized spikes lasting 2-3 seconds alternating with 5-9 seconds of generalized suppression. Hyperventilation and photic stimulation were not performed.      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 Additionally there is  profound diffuse encephalopathy, likely related to sedation.   Chales Pelissier O Darlis Wragg

## 2024-11-17 NOTE — Progress Notes (Signed)
 Spoke with patient's mother, [name], over the phone who gave verbal confirmation to allow a designated person to take patient's belongings ( pillow and charger).

## 2024-11-17 NOTE — Death Summary Note (Signed)
 " DEATH SUMMARY   Patient Details  Name: Jo Matthews MRN: [id number] DOB: Feb 16, 1996  Admission/Discharge Information   Admit Date:  2024-11-03  Date of Death: Date of Death: 11/09/24  Time of Death: Time of Death: 11/17/1329  Length of Stay: 6  Referring Physician: Darrol Merck, Matthews   Reason(s) for Hospitalization  Hypoxemia and fevere  Diagnoses  Preliminary cause of death:  Secondary Diagnoses (including complications and co-morbidities):  Principal Problem:   Acute respiratory failure with hypoxia (HCC) Active Problems:   Rett syndrome   Spastic quadriplegia (HCC)   Acute on chronic hypoxic respiratory failure (HCC)   Sepsis (HCC)   Seizure disorder (HCC)   Community acquired pneumonia   Acute on chronic respiratory failure with hypoxia [facility])   Brief Hospital Course (including significant findings, care, treatment, and services provided and events leading to death)  [name] is a 29 y.o. 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 She was initially admitted to IMTS for sepsis secondary to pneumonia however had progressive hypoxemia and PCCM consulted for intubation and admission to the ICU. She developed ARDS and was proned on 12/19 however had difficulty tolerating. Jo Matthews  was started. Shock improved however continued to have persistent hypoxemia. Bronchoscopy negative cultures. Repeat bronchoscopy noted extrinsic airway compression related to patient's anatomy. Discussed re-proning with family due to refractory hypoxemia however after discussion regarding goals of care, decision made to transition to comfort care. Patient was compassionately extubated and expired at 1330 on 12/21/129    Pertinent Labs and Studies  Significant Diagnostic  Studies DG CHEST PORT 1 VIEW Result Date: 11/05/2024 EXAM: 1 VIEW(S) XRAY OF THE CHEST 11/05/2024 12:22:31 PM COMPARISON: 11/05/2024 CLINICAL HISTORY: History of ETT [id number] FINDINGS: LINES, TUBES AND DEVICES: Endotracheal tube in place with tip 2.3 cm above carina. LUNGS AND PLEURA: Persistent right greater than left interstitial and alveolar opacities. Persistent small bilateral pleural effusions. No pneumothorax. HEART AND MEDIASTINUM: No acute abnormality of the cardiac and mediastinal silhouettes. BONES AND SOFT TISSUES: Partially imaged spinal fusion hardware. IMPRESSION: 1. Endotracheal tube tip 2.3 cm above the carina. 2. Persistent right greater than left interstitial and alveolar opacities. 3. Persistent small bilateral pleural effusions. Electronically signed by: Jo Matthews 11/05/2024 12:50 PM EST RP Workstation: [id number]   DG Chest Port 1 View Result Date: 11/05/2024 EXAM: 1 VIEW(S) XRAY OF THE CHEST 11/05/2024 07:28:00 AM COMPARISON: 11/05/2024 CLINICAL HISTORY: Endotracheal tube present. FINDINGS: LINES, TUBES AND DEVICES: Endotracheal tube in place with tip 2 cm above the carina. LUNGS AND PLEURA: Small bilateral pleural effusions. Persistent bilateral airspace opacities right greater than left. No pneumothorax. HEART AND MEDIASTINUM: Cardiomegaly, unchanged. BONES AND SOFT TISSUES: Thoracolumbar fusion hardware noted. IMPRESSION: 1. Endotracheal tube tip 2 cm above the carina. 2. Persistent bilateral airspace opacities, right greater than left. 3. Small bilateral pleural effusions. Electronically signed by: Jo Matthews 11/05/2024 07:40 AM EST RP Workstation: [id number]   DG Chest Port 1 View Result Date: 11/05/2024 EXAM: 1 VIEW(S) XRAY OF THE CHEST 11/05/2024 06:03:00 AM COMPARISON: 11/05/2024 CLINICAL HISTORY: Endotracheal tube present FINDINGS: LINES, TUBES AND DEVICES: Endotracheal tube tip appears to enter the right mainstem bronchus. Recommend repositioning retraction by  approximately 3 cm. LUNGS AND PLEURA: Small bilateral pleural effusions. Stable bilateral airspace opacities, right greater than left. No pneumothorax. HEART AND MEDIASTINUM: Unchanged cardiomegaly.  BONES AND SOFT TISSUES: Thoracolumbar spinal fixation hardware noted. IMPRESSION: 1. Endotracheal tube tip enters the right mainstem bronchus. Recommend retraction by approximately 3 cm. 2. Stable bilateral airspace opacities, right greater than left. 3. Small bilateral pleural effusions. 4. The urgent finding will be called to the ordering provider by the Professional Radiology Assistants (PRAs) and documented in the [facility] dashboard. Electronically signed by: Jo Matthews 11/05/2024 06:12 AM EST RP Workstation: [id number]   DG CHEST PORT 1 VIEW Result Date: 11/05/2024 EXAM: 1 VIEW(S) XRAY OF THE CHEST 11/05/2024 04:55:08 AM COMPARISON: 11/03/2024 CLINICAL HISTORY: Encounter for intubation FINDINGS: LINES, TUBES AND DEVICES: Endotracheal tube in place with tip at the level of the Thoracic Inlet, 9.3 cm above the Carina. LUNGS AND PLEURA: Bilateral airspace opacities. Bilateral pleural effusions. No pneumothorax. HEART AND MEDIASTINUM: Cardiomegaly, unchanged. BONES AND SOFT TISSUES: Thoracolumbar spinal fixation hardware noted. IMPRESSION: 1. Endotracheal tube tip at the level of the thoracic inlet, approximately 9.3 cm above the carina; consider advancement. 2. Cardiomegaly, unchanged. 3. Bilateral pleural effusions with bilateral airspace opacities. Electronically signed by: Jo Matthews 11/05/2024 05:13 AM EST RP Workstation: [id number]   CT HEAD WO CONTRAST ( ) Result Date: 11/04/2024 EXAM: CT HEAD WITHOUT CONTRAST 11/04/2024 02:08:00 PM TECHNIQUE: CT of the head was performed without the administration of intravenous contrast. Automated exposure control, iterative reconstruction, and/or weight based adjustment of the mA/kV was utilized to reduce the radiation dose to as low as reasonably  achievable. COMPARISON: CT head without contrast 12/04/2014. CLINICAL HISTORY: Mental status change, unknown cause. FINDINGS: BRAIN AND VENTRICLES: No acute hemorrhage. No evidence of acute infarct. No hydrocephalus. No extra-axial collection. No mass effect or midline shift. Dural calcifications along the tentorium are stable. ORBITS: No acute abnormality. SINUSES: No acute abnormality. SOFT TISSUES AND SKULL: No acute soft tissue abnormality. No skull fracture. IMPRESSION: 1. No acute intracranial abnormality. Electronically signed by: Lonni Necessary Matthews 11/04/2024 02:24 PM EST RP Workstation: [id number]   ECHOCARDIOGRAM COMPLETE Result Date: 11/04/2024    ECHOCARDIOGRAM REPORT   Patient Name:   Robie Oats Date of Exam: 11/04/2024 Medical Rec #:  [id number]   Height:       59.0 in Accession #:    [id number]  Weight:       116.8 lb Date of Birth:  [birth year]   BSA:          1.468 m Patient Age:    28 years    BP:           119/73 mmHg Patient Gender: F           HR:           95 bpm. Exam Location:  Inpatient Procedure: 2D Echo, Cardiac Doppler, Color Doppler and Intracardiac            Opacification Agent (Both Spectral and Color Flow Doppler were            utilized during procedure). Indications:    Hypoxia  History:        Patient has no prior history of Echocardiogram examinations.  Sonographer:    Philomena Daring Referring Phys: ZOLA LOISE HERTER IMPRESSIONS  1. Left ventricular ejection fraction, by estimation, is 60 to 65%. The left ventricle has normal function. The left ventricle has no regional wall motion abnormalities. Left ventricular diastolic parameters were normal.  2. Right ventricular systolic function is normal. The right ventricular size is normal.  3. The mitral valve is normal in structure. Trivial mitral valve regurgitation. No  evidence of mitral stenosis.  4. The aortic valve was not well visualized. Aortic valve regurgitation is not visualized. No aortic stenosis is present. FINDINGS   Left Ventricle: Left ventricular ejection fraction, by estimation, is 60 to 65%. The left ventricle has normal function. The left ventricle has no regional wall motion abnormalities. The left ventricular internal cavity size was small. There is no left ventricular hypertrophy. Left ventricular diastolic parameters were normal. Right Ventricle: The right ventricular size is normal. No increase in right ventricular wall thickness. Right ventricular systolic function is normal. Left Atrium: Left atrial size was normal in size. Right Atrium: Right atrial size was normal in size. Pericardium: There is no evidence of pericardial effusion. Mitral Valve: The mitral valve is normal in structure. Trivial mitral valve regurgitation. No evidence of mitral valve stenosis. Tricuspid Valve: The tricuspid valve is normal in structure. Tricuspid valve regurgitation is trivial. Aortic Valve: The aortic valve was not well visualized. Aortic valve regurgitation is not visualized. No aortic stenosis is present. Aortic valve mean gradient measures 5.0 mmHg. Aortic valve peak gradient measures 9.0 mmHg. Aortic valve area, by VTI measures 1.85 cm. Pulmonic Valve: The pulmonic valve was not well visualized. Pulmonic valve regurgitation is not visualized. Aorta: The aortic root and ascending aorta are structurally normal, with no evidence of dilitation. IAS/Shunts: The interatrial septum was not well visualized.  LEFT VENTRICLE PLAX 2D LVIDd:         3.30 cm   Diastology LVIDs:         2.30 cm   LV e' medial:    8.92 cm/s LV PW:         0.90 cm   LV E/e' medial:  11.1 LV IVS:        0.90 cm   LV e' lateral:   11.90 cm/s LVOT diam:     1.70 cm   LV E/e' lateral: 8.3 LV SV:         38 LV SV Index:   26 LVOT Area:     2.27 cm  RIGHT VENTRICLE             IVC RV S prime:     13.40 cm/s  IVC diam: 1.50 cm TAPSE (M-mode): 1.7 cm LEFT ATRIUM           Index        RIGHT ATRIUM          Index LA diam:      2.60 cm 1.77 cm/m   RA Area:     8.50  cm LA Vol (A2C): 14.0 ml 9.54 ml/m   RA Volume:   14.90 ml 10.15 ml/m LA Vol (A4C): 21.4 ml 14.58 ml/m  AORTIC VALVE AV Area (Vmax):    1.76 cm AV Area (Vmean):   1.72 cm AV Area (VTI):     1.85 cm AV Vmax:           150.00 cm/s AV Vmean:          97.000 cm/s AV VTI:            0.207 m AV Peak Grad:      9.0 mmHg AV Mean Grad:      5.0 mmHg LVOT Vmax:         116.00 cm/s LVOT Vmean:        73.400 cm/s LVOT VTI:          0.169 m LVOT/AV VTI ratio: 0.82  AORTA Ao Root diam: 2.20 cm  Ao Asc diam:  2.50 cm MITRAL VALVE               TRICUSPID VALVE MV Area (PHT): 5.42 cm    TR Peak grad:   8.6 mmHg MV Decel Time: 140 msec    TR Vmax:        147.00 cm/s MV E velocity: 99.00 cm/s MV A velocity: 68.70 cm/s  SHUNTS MV E/A ratio:  1.44        Systemic VTI:  0.17 m                            Systemic Diam: 1.70 cm Lonni Nanas Matthews Electronically signed by Lonni Nanas Matthews Signature Date/Time: 11/04/2024/12:39:25 PM    Final    US  RENAL Result Date: 11/03/2024 CLINICAL DATA:  Acute renal insufficiency. EXAM: RENAL / URINARY TRACT ULTRASOUND COMPLETE COMPARISON:  None Available. FINDINGS: Images somewhat limited due to lack of patient cooperation. Right Kidney: Renal measurements: 10.4 x 5.8 x 5.6 cm = volume: 178 mL. Echogenicity within normal limits. No mass or hydronephrosis visualized. Left Kidney: Renal measurements: 9.6 x 5.5 x 6.0 cm = volume: 168 mL. Echogenicity within normal limits. No mass or hydronephrosis visualized. Bladder: Appears normal for degree of bladder distention. Other: None. IMPRESSION: Normal renal ultrasound. Electronically Signed   By: Toribio Agreste M.D.   On: 11/03/2024 09:12   DG Chest Port 1 View Result Date: 11/03/2024 EXAM: 1 VIEW(S) XRAY OF THE CHEST 11/03/2024 06:49:00 AM COMPARISON: 10/31/2024 CLINICAL HISTORY: Acute respiratory failure (HCC) FINDINGS: LINES, TUBES AND DEVICES: Stable endotracheal tube in place. Enteric tube removed. LUNGS AND PLEURA: Improved  aeration of left lung base. Persistent bilateral pleural effusions. Persistent hazy opacities throughout right lung. No pneumothorax. HEART AND MEDIASTINUM: Unchanged cardiomegaly. BONES AND SOFT TISSUES: Thoracolumbar fusion hardware noted. IMPRESSION: 1. Persistent hazy opacities throughout the right lung with improved aeration o. f the left lung base. 2. Persistent bilateral pleural effusions. 3. Unchanged cardiomegaly. Electronically signed by: Jo Matthews 11/03/2024 07:30 AM EST RP Workstation: [id number]   CT CHEST WO CONTRAST Result Date: 11/02/2024 CLINICAL DATA:  Acute respiratory failure with hypoxia EXAM: CT CHEST WITHOUT CONTRAST TECHNIQUE: Multidetector CT imaging of the chest was performed following the standard protocol without IV contrast. RADIATION DOSE REDUCTION: This exam was performed according to the departmental dose-optimization program which includes automated exposure control, adjustment of the mA and/or kV according to patient size and/or use of iterative reconstruction technique. COMPARISON:  October 09, 2023 FINDINGS: Cardiovascular: No significant vascular findings. Normal heart size. No pericardial effusion. Mediastinum/Nodes: Feeding tube is seen passing through esophagus into stomach. Endotracheal tube is seen directed into right mainstem bronchus; withdrawal by 3-4 cm is recommended. Visualized thyroid gland is unremarkable. No definite adenopathy is noted on these unenhanced images. Lungs/Pleura: No pneumothorax or pleural effusion is noted. Diffuse airspace opacities are noted with air bronchograms in the right lung concerning for pneumonia. Mild opacities are noted in left lower lobe and to some degree lingular segment of left upper lobe with air bronchograms concerning for pneumonia as well Upper Abdomen: No acute abnormality. Musculoskeletal: Postsurgical changes are noted in thoracic spine. No acute osseous abnormality is noted. IMPRESSION: 1. Endotracheal tube is  seen directed into right mainstem bronchus; withdrawal by 3-4 cm is recommended. These results will be called to the ordering clinician or representative by the Radiologist Assistant, and communication documented in the PACS or [employer] Dashboard. 2. Diffuse airspace  opacities are noted with air bronchograms in the right lung concerning for pneumonia. Mild opacities are noted in left lower lobe and to some degree lingular segment of left upper lobe with air bronchograms concerning for pneumonia as well. Electronically Signed   By: Lynwood Landy Raddle M.D.   On: 11/02/2024 13:37   Overnight EEG with video Result Date: 11/01/2024 Shelton Arlin KIDD, Matthews     11/02/2024  9:47 AM Patient Name: Eloina Ergle MRN: [id number] Epilepsy Attending: Arlin KIDD Shelton Referring Physician/Provider: Shelton Arlin KIDD, Matthews Duration: 10/31/2024 1005 to 11/01/2024 1005  Patient history: 28yo F patient with history of Rett syndrome, seizures, oxygen dependence here for evaluation of hypoxia. EEG to evaluate for seizure  Level of alertness: comatose  AEDs during EEG study: CBD, Clonazepam , VPA, LEV, Perampanel   Technical aspects: This EEG study was done with scalp electrodes positioned according to the 10-20 International system of electrode placement. Electrical activity was reviewed with band pass filter of 1-70Hz , sensitivity of 7 uV/mm, display speed of 64mm/sec with a 60Hz  notched filter applied as appropriate. EEG data were recorded continuously and digitally stored.  Video monitoring was available and reviewed as appropriate.  Description: EEG showed continuous generalized 3 to 6 Hz theta-delta slowing. Generalized periodic epileptiform discharges with overriding fast activity and rhythmicity were noted at 1-2.5Hz , lasting 3-12 seconds alternating with 1-2  seconds of generalized suppression. Hyperventilation and photic stimulation were not performed.    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 However the frequency of epileptiform discharges  as well as overriding fast activity is on the ictal-interictal continuum with increased risk of seizures.  Additionally there is  moderate diffuse encephalopathy.  Arlin KIDD Shelton   DG CHEST PORT 1 VIEW Result Date: 10/31/2024 EXAM: 1 VIEW(S) XRAY OF THE CHEST 10/31/2024 01:01:00 PM COMPARISON: Prior study dated 10/31/2024. CLINICAL HISTORY: Intubation of airway performed without difficulty. FINDINGS: LINES, TUBES AND DEVICES: Endotracheal tube tip is about 1.5 cm above the carina. Visualization of the carina is limited due to spinal fusion hardware. An enteric tube has been placed with the tip projecting over the right upper quadrant, consistent with the location of the distal stomach. LUNGS AND PLEURA: Shallow inspiration. Infiltration or atelectasis in the lung bases. Probable pleural effusions. No pneumothorax. HEART AND MEDIASTINUM: Heart size is obscured by the pleural and parenchymal process. BONES AND SOFT TISSUES: Postoperative changes with posterior rod and screw fixation of the thoracolumbar spine. IMPRESSION: 1. Endotracheal tube terminates approximately 1.5 cm above the carina; enteric tube courses into the stomach. 2. Basilar atelectasis or infiltrates with probable pleural effusions. No pneumothorax. 3. Postoperative thoracolumbar posterior rod and screw fixation. Electronically signed by: Elsie Gravely Matthews 10/31/2024 01:29 PM EST RP Workstation: [id number]   EEG adult Result Date: 10/31/2024 Shelton Arlin KIDD, Matthews     10/31/2024 11:26 AM Patient Name: Merrell Rettinger MRN: [id number] Epilepsy Attending: Arlin KIDD Shelton Referring Physician/Provider: Tobie Gaines, DO Date: 10/31/2024 Duration: 22.18 mins Patient history: 29yo F patient with history of Rett syndrome, seizures, oxygen dependence here for evaluation of hypoxia. EEG  to evaluate for seizure Level of alertness: Awake AEDs during EEG study: CBD, Clonazepam , VPA, LEV, Perampanel  Technical aspects: This EEG study was done with scalp electrodes positioned according to the 10-20 International system of electrode placement. Electrical activity was reviewed with band pass filter of 1-70Hz , sensitivity of 7 uV/mm, display speed  of 23mm/sec with a 60Hz  notched filter applied as appropriate. EEG data were recorded continuously and digitally stored.  Video monitoring was available and reviewed as appropriate. Description: EEG showed continuous generalized 3 to 6 Hz theta-delta slowing. Generalized periodic epileptiform discharges with overriding fast activity were noted at 1-2Hz . Hyperventilation and photic stimulation were not performed.   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 However the frequency of epileptiform discharges  as well as overriding fast activity is on the ictal-interictal continuum with increased risk of seizures. Consider long term monitoring for further evaluation. Additionally there is  moderate diffuse encephalopathy. Arlin MALVA Krebs   DG Chest Port 1 View Result Date: 10/31/2024 EXAM: 1 VIEW(S) XRAY OF THE CHEST 10/31/2024 06:57:00 AM COMPARISON: Portable chest 04/13/2024. CLINICAL HISTORY: Questionable sepsis - evaluate for abnormality. FINDINGS: LUNGS AND PLEURA: There is increased dense airspace consolidation in the left mid and both lower lung fields, favor multilobar pneumonia. The rest of the upper lung fields are clear. No pleural effusion. No pneumothorax. HEART AND MEDIASTINUM: There is mild cardiomegaly without evidence of CHF. BONES AND SOFT TISSUES: Multilevel thoracolumbar fusion hardware is again shown with mild thoracic dextroscoliosis. IMPRESSION: 1. Increased dense airspace  consolidation in the left mid and both lower lung fields, favoring multilobar pneumonia. 2. Mild cardiomegaly without radiographic evidence of heart failure. Electronically signed by: Francis Quam Matthews 10/31/2024 07:19 AM EST RP Workstation: [id number]    Microbiology Recent Results (from the past 240 hours)  Resp panel by RT-PCR (RSV, Flu A&B, Covid) Anterior Nasal Swab     Status: None   Collection Time: 10/31/24  6:33 AM   Specimen: Anterior Nasal Swab  Result Value Ref Range Status   SARS Coronavirus 2 by RT PCR NEGATIVE NEGATIVE Final   Influenza A by PCR NEGATIVE NEGATIVE Final   Influenza B by PCR NEGATIVE NEGATIVE Final    Comment: (NOTE) The Xpert Xpress SARS-CoV-2/FLU/RSV plus assay is intended as an aid in the diagnosis of influenza from Nasopharyngeal swab specimens and should not be used as a sole basis for treatment. Nasal washings and aspirates are unacceptable for Xpert Xpress SARS-CoV-2/FLU/RSV testing.  Fact Sheet for Patients: bloggercourse.com  Fact Sheet for Healthcare Providers: seriousbroker.it  This test is not yet approved or cleared by the [place]  FDA and has been authorized for detection and/or diagnosis of SARS-CoV-2 by FDA under an Emergency Use Authorization (EUA). This EUA will remain in effect (meaning this test can be used) for the duration of the COVID-19 declaration under Section 564(b)(1) of the Act, 21 U.S.C. section 360bbb-3(b)(1), unless the authorization is terminated or revoked.     Resp Syncytial Virus by PCR NEGATIVE NEGATIVE Final    Comment: (NOTE) Fact Sheet for Patients: bloggercourse.com  Fact Sheet for Healthcare Providers: seriousbroker.it  This test is not yet approved or cleared by the [place]  FDA and has been authorized for detection and/or diagnosis of SARS-CoV-2 by FDA under an Emergency Use Authorization (EUA).  This EUA will remain in effect (meaning this test can be used) for the duration of the COVID-19 declaration under Section 564(b)(1) of the Act, 21 U.S.C. section 360bbb-3(b)(1), unless the authorization is terminated or revoked.  Performed at [facility], [address] [place], [place] [address]   Blood Culture (routine x 2)     Status: None (Preliminary result)   Collection Time: 10/31/24  6:38  AM   Specimen: BLOOD LEFT HAND  Result Value Ref Range Status   Specimen Description BLOOD LEFT HAND  Final   Special Requests   Final    BOTTLES DRAWN AEROBIC ONLY Blood Culture adequate volume   Culture   Final    NO GROWTH 4 DAYS Performed at [facility], [address] [place], [place] [address]    Report Status PENDING  Incomplete  Blood Culture (routine x 2)     Status: None   Collection Time: 10/31/24  6:58 AM   Specimen: BLOOD LEFT HAND  Result Value Ref Range Status   Specimen Description BLOOD LEFT HAND  Final   Special Requests   Final    BOTTLES DRAWN AEROBIC ONLY Blood Culture adequate volume   Culture   Final    NO GROWTH 5 DAYS Performed at [facility], [address], [place], [place] [address]    Report Status 11/05/2024 FINAL  Final  Respiratory (~20 pathogens) panel by PCR     Status: None   Collection Time: 10/31/24 12:00 PM   Specimen: Nasopharyngeal Swab; Respiratory  Result Value Ref Range Status   Adenovirus NOT DETECTED NOT DETECTED Final   Coronavirus 229E NOT DETECTED NOT DETECTED Final    Comment: (NOTE) The Coronavirus on the Respiratory Panel, DOES NOT test for the novel  Coronavirus (2019 nCoV)    Coronavirus HKU1 NOT DETECTED NOT DETECTED Final   Coronavirus NL63 NOT DETECTED NOT DETECTED Final   Coronavirus OC43 NOT DETECTED NOT DETECTED Final   Metapneumovirus NOT DETECTED NOT DETECTED Final   Rhinovirus / Enterovirus NOT DETECTED NOT DETECTED Final   Influenza A NOT DETECTED NOT DETECTED Final   Influenza B NOT  DETECTED NOT DETECTED Final   Parainfluenza Virus 1 NOT DETECTED NOT DETECTED Final   Parainfluenza Virus 2 NOT DETECTED NOT DETECTED Final   Parainfluenza Virus 3 NOT DETECTED NOT DETECTED Final   Parainfluenza Virus 4 NOT DETECTED NOT DETECTED Final   Respiratory Syncytial Virus NOT DETECTED NOT DETECTED Final   Bordetella pertussis NOT DETECTED NOT DETECTED Final   Bordetella Parapertussis NOT DETECTED NOT DETECTED Final   Chlamydophila pneumoniae NOT DETECTED NOT DETECTED Final   Mycoplasma pneumoniae NOT DETECTED NOT DETECTED Final    Comment: Performed at [facility], [address] [place], [place] [address]  MRSA Next Gen by PCR, Nasal     Status: None   Collection Time: 11/01/24 12:54 PM   Specimen: Nasal Mucosa; Nasal Swab  Result Value Ref Range Status   MRSA by PCR Next Gen NOT DETECTED NOT DETECTED Final    Comment: (NOTE) The GeneXpert MRSA Assay (FDA approved for NASAL specimens only), is one component of a comprehensive MRSA colonization surveillance program. It is not intended to diagnose MRSA infection nor to guide or monitor treatment for MRSA infections. Test performance is not FDA approved in patients less than 19 years old. Performed at [facility], [address] [place], [place] [address]   Culture, Respiratory w Gram Stain     Status: None   Collection Time: 11/02/24  3:41 PM   Specimen: Bronchoalveolar Lavage; Respiratory  Result Value Ref Range Status   Specimen Description BRONCHIAL ALVEOLAR LAVAGE  Final   Special Requests RLL  Final   Gram Stain NO WBC SEEN NO ORGANISMS SEEN   Final   Culture   Final    NO GROWTH 3 DAYS Performed at [facility],  [address] [place], [place] [address]    Report Status 11/05/2024 FINAL  Final    Lab Basic Metabolic Panel: Recent Labs  Lab 10/31/24 1412 11/01/24 0256 11/02/24 0249 11/02/24 1011 11/03/24 0437 11/03/24 0651 11/04/24 0648 11/04/24 0816 11/05/24 0823 11/05/24 0826  11/05/24 1600 11/05/24 1812 11/05/24 2314 Nov 07, 2024 0136 2024-11-07 0154 07-Nov-2024 0442 11-07-24 0811  NA  --  134*  --    < > 132*   < > 135   < > 134*   < > 134*   < > 136 135 134* 137 135  K  --  3.7  --    < > 3.3*   < > 4.2   < > 3.4*   < > 4.3   < > 4.2 4.0 4.0 4.1 4.0  CL  --  96*  --    < > 94*  --  96*  --  94*  --  93*  --   --   --   --  95*  --   CO2  --  26  --    < > 22  --  24  --  25  --  23  --   --   --   --  26  --   GLUCOSE  --  127*  --    < > 213*  --  72  --  169*  --  196*  --   --   --   --  126*  --   BUN  --  18  --    < > 35*  --  42*  --  46*  --  49*  --   --   --   --  56*  --   CREATININE  --  1.05*  --    < > 1.48*  --  1.40*  --  1.48*  --  1.58*  --   --   --   --  1.60*  --   CALCIUM   --  8.3*  --    < > 9.1  --  10.2  --  9.7  --  9.8  --   --   --   --  9.6  --   MG 1.7 1.3* 3.7*  --  3.4*  --   --   --   --   --   --   --   --   --   --   --   --   PHOS 1.9* 1.8* 5.8*  --  5.7*  --   --   --   --   --   --   --   --   --   --   --   --    < > = values in this interval not displayed.   Liver Function Tests: Recent Labs  Lab 10/31/24 0639 11/01/24 0256  AST 38 51*  ALT 19 21  ALKPHOS 60 41  BILITOT 0.7 0.8  PROT 6.6 5.0*  ALBUMIN 2.7* 1.9*   No results for input(s): LIPASE, AMYLASE in the last 168 hours. No results for input(s): AMMONIA in the last 168 hours. CBC: Recent Labs  Lab 10/31/24 [phone] 10/31/24 9350 11/01/24 0256 11/03/24 9348 11/04/24 9351 11/04/24 0816 11/05/24 0823 11/05/24 0826 11/05/24 1600 11/05/24 1812 11/05/24 2314 11-07-24 0136 2024/11/07 0154 11-07-24 0442 11/07/24 0811  WBC 2.3*  --  13.2*  --  23.8*  --  23.8*  --  29.1*  --   --   --   --  35.9*  --   NEUTROABS 1.5*  --  6.9  --   --   --   --   --   --   --   --   --   --   --   --   HGB 11.2*   < > 8.6*   < > 9.3*   < > 7.7*   < > 7.6*   < > 6.8* 18.0* 9.9* 7.3* 7.5*  HCT 36.1   < > 27.1*   < > 29.5*   < > 24.0*   < > 23.6*   < > 20.0* 53.0* 29.0*  21.8* 22.0*  MCV 104.3*  --  105.0*  --  102.8*  --  101.7*  --  99.6  --   --   --   --  98.6  --   PLT 176  --  139*  --  111*  --  86*  --  88*  --   --   --   --  95*  --    < > = values in this interval not displayed.   Cardiac Enzymes: No results for input(s): CKTOTAL, CKMB, CKMBINDEX, TROPONINI in the last 168 hours. Sepsis Labs: Recent Labs  Lab 10/31/24 0649 10/31/24 0907 11/01/24 0256 11/04/24 0648 11/05/24 0823 11/05/24 1600 11-13-2024 0442  WBC  --   --    < > 23.8* 23.8* 29.1* 35.9*  LATICACIDVEN 1.6 2.2*  --   --   --   --   --    < > = values in this interval not displayed.      Raphael Fitzpatrick Slater Staff 2024/11/13, 2:34 PM   "

## 2024-11-17 NOTE — Progress Notes (Signed)
 EEG LTM D/C'd. [facility] notified. No noted skin break down.

## 2024-11-17 NOTE — Progress Notes (Signed)
 "  NAME:  Jo Matthews, MRN:  [id number], DOB:  07/06/1996, LOS: 6 ADMISSION DATE:  10/31/2024, CONSULTATION DATE: 12/15 REFERRING MD: Dr. Tobie IM TS, CHIEF COMPLAINT: Shortness of breath  History of Present Illness:  29 year old female with past medical history significant for Rett syndrome, wheelchair/bedbound, nonverbal at baseline, chronic respiratory failure with hypoxia on 2 L, PEG tube dependent, and seizure history on multiple AEDs.  She was in her usual state of health until 12/14 when she had 1 episode of hypoxia (wears pulse ox at home).  This recovered spontaneously and she was again doing fine until 1215 early a.m. when she was increasingly lethargic as well as hypoxic with sats in the 70s and 80s.  Also had new onset fever.  Imaging with concern for pneumonia and she was admitted to the internal medicine teaching service for management thereof.  Vancomycin  and doxycycline  were initiated.  While still in the ED she became increasingly hypoxic requiring up titration of heated high flow nasal cannula to 80% FiO2 and 40 L/min.  Despite this she remained in the high 80s to low 90s and PCCM was asked to evaluate for ICU care.  Pertinent  Medical History   has a past medical history of Fracture, humerus closed, G tube feedings (HCC), Pneumonia, Recurrent fever of unknown cause (08/31/2012), Rett's syndrome, Scoliosis, Seizure disorder (HCC), Seizures (HCC), and Weight loss.   Significant Hospital Events: Including procedures, antibiotic start and stop dates in addition to other pertinent events   12/15 admit to ICU for respiratory failure, intubated in the ICU, on CTX 11/01/2024 remains mechanically intubated, 12/17 Versed  stopped, bronch w/ BAL 12/18 Changed to Zosyn , prone patient  12/19 Returned supine  Interim History / Subjective:  Unchanged hypoxemia FIO2 70% PEEP 16 Unable to wean PEEP yesterday Discussed reproning and barriers that patient experienced last time with mother. We  discussed GOC and how she does not want her daughter to suffer. Discussed what comfort care would look like. She will discuss with patient's father.   Objective    Blood pressure 109/69, pulse (!) 110, temperature 98.8 F (37.1 C), temperature source Axillary, resp. rate (!) 27, height 4' 11 (1.499 m), weight 50.1 kg, SpO2 96%.    Vent Mode: PRVC FiO2 (%):  [70 %-100 %] 70 % Set Rate:  [22 bmp-26 bmp] 26 bmp Vt Set:  [300 mL] 300 mL PEEP:  [14 cmH20-16 cmH20] 16 cmH20 Plateau Pressure:  [33 cmH20-40 cmH20] 40 cmH20   Intake/Output Summary (Last 24 hours) at 11-28-24 9171 Last data filed at 11/28/24 0715 Gross per 24 hour  Intake 1128 ml  Output 1050 ml  Net 78 ml   Filed Weights   11/04/24 0500 11/05/24 0500 28-Nov-2024 0444  Weight: 53 kg 51.9 kg 50.1 kg   Physical Exam: General: Young, chronically ill-appearing, sedated HENT: [place], AT, ETT in place, EEG leads in place Eyes: EOMI, no scleral icterus Respiratory: Coarse breath sounds to auscultation bilaterally.  No crackles, wheezing  Cardiovascular: Mild tachycardia, RR, -M/R/G, no JVD GI: BS+, soft, nontender Extremities: LE contractures, anasarca,-tenderness Neuro: Sedated GU: External foley in place  Imaging, labs and test in EMR in the last 24 hours reviewed independently by me. 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 Stable/unchanged EEG findings.  - CT head 12/19 NAICA - Continue AEDs including depakote , clonazepam , cannabidiol , Keppra , perampanel  - Appreciate neurology recommendations  - Off paralytics - On fentanyl  per PAD  protocol  Pulm # Acute hypoxic respiratory failure/ARDS -currently intubated # Community-acquired pneumonia Bronchoscopy done 12/17. Remains mechanically ventilated. Peak pressures still high. CT chest showed diffuse opacities w/ bronchograms in right lung c/f PNA. Still no improvement on imaging and vent settings, prone patient 12/18, returned to supine 12/19. -Dexamethasone  20 mg daily  -Broadened to Zosyn  12/18 -[name]  for refractory hypoxemia. End 12/21 -Full vent support -LTVV, 4-8cc/kg IBW with goal Pplat<30 and DP<15 -ABG reviewed. Discussed reproning and barriers that patient experienced last time with mother. We discussed GOC and how she does not want her daughter to suffer. Discussed what comfort care would look like. She will discuss with patient's father.  -Diurese -F/u 12/17 BAL - NGTD  Cardiac/Vascular  #Hypotension - resolved Remains off pressors Echo with normal EF, normal RV function and size, no valvular abnormalities  GI Diet: Continue tube feeds GI PPX: Continue Pepcid   ID Community-acquired pneumonia Leukocytosis: Worsening -vanc stopped as MRSA nares negative  -completed 3 days of doxycycline   -Zosyn  12/18 to broaden coverage -RVP negative, blood cultures (so far NG)  Renal  #Acute kidney injury likely in the setting of sepsis: worsening #Mild hyponatremia #Hypokalemia - Negative renal US  - Trend BMET  Endo CBG down to 87, was on SSI-S, decrease sensitivity if further below goal then d/c SSI  Heme/Onc Chronic macrocytic anemia Thrombocytopenia Hgb steady at 9.   DVT ppx : heparin  SQ  MSK/other  Chronic contractures  Updated wife at bedside  The patient is critically ill with multiple organ systems failure and requires high complexity decision making for assessment and support, frequent evaluation and titration of therapies, application of advanced monitoring technologies and extensive interpretation of multiple databases.  Independent  Critical Care Time: 50 Minutes.   Slater Staff, M.D. [facility] Pulmonary/Critical Care Medicine 12-06-24 8:29 AM   Please see Amion for pager number to reach on-call Pulmonary and Critical Care Team.    "

## 2024-11-17 NOTE — Progress Notes (Signed)
 Morphine  gtt started, family at bedside.

## 2024-11-17 NOTE — Procedures (Signed)
 Patient Name: Jo Matthews  MRN: [id number]  Epilepsy Attending: Arlin MALVA Krebs  Referring Physician/Provider: Krebs Arlin MALVA, MD  Duration: 2024-11-08 1005 to 08-Nov-2024 1253   Patient history: 28yo F patient with history of Rett syndrome, seizures, oxygen dependence here for evaluation of hypoxia. EEG to evaluate for seizure   Level of alertness: comatose   AEDs during EEG study: CBD, Clonazepam , VPA, LEV, Perampanel    Technical aspects: This EEG study was done with scalp electrodes positioned according to the 10-20 International system of electrode placement. Electrical activity was reviewed with band pass filter of 1-70Hz , sensitivity of 7 uV/mm, display speed of 97mm/sec with a 60Hz  notched filter applied as appropriate. EEG data were recorded continuously and digitally stored.  Video monitoring was available and reviewed as appropriate.   Description: EEG showed burst suppression pattern with bursts of generalized 3-6hz  theta-delta slowing admixed with generalized spikes lasting 2-3 seconds alternating with 5-9 seconds of generalized suppression. Hyperventilation and photic stimulation were not performed.      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 Additionally there is  profound diffuse encephalopathy, likely related to sedation.   Kaire Stary O Merek Niu

## 2024-11-17 NOTE — Progress Notes (Signed)
 Nutrition Brief Note  Chart reviewed. Pt now transitioning to comfort care.  No further nutrition interventions planned at this time.  Please re-consult as needed.   Myrtie Cruise MS, RD, LDN Registered Dietitian Clinical Nutrition RD Inpatient Contact Info in Amion

## 2024-11-17 NOTE — Progress Notes (Signed)
 Time of death 1.   Family at bedside.

## 2024-11-17 NOTE — Progress Notes (Signed)
 "  Palliative Medicine Inpatient Follow Up Note HPI: Palliative Care consult requested for goals of care discussion in this 29 y.o. female  with past medical history of Rett syndrome, nonverbal at baseline, wheelchair/bedbound, PEG tube, chronic hypoxia (2L home oxygen), seizure disorder. She was admitted on 10/31/2024 from home with lethargy, hypoxia (70-80s), and temperature. During work-up patient became increasingly hypoxic on high flow nasal cannula later requiring intubation. Chest imaging concerned for pneumonia.   Palliative care is involved to support additional goals of care conversations.  Today's Discussion November 09, 2024  *Please note that this is a verbal dictation therefore any spelling or grammatical errors are due to the Dragon Medical One system interpretation.   I reviewed the chart notes including nursing notes from today, progress notes from today. I also reviewed vital signs, nursing flowsheets, medication administrations record, labs, and imaging.    Oral Intake %: Gets G-tube feedings I/O:  (-) Bowel Movements:  11/09/2024 Mobility: Bedbound at baseline  I met this morning with patient's mother, [name] father, Lamar, brother Lamar, and patient's sister.  We discussed what Quiara's family has been told by the critical care doctor.  Patient's mother, [name] explains that she has seen her daughter sick many times throughout her life.  It is not uncommon for Cortlynn to be hospitalized twice a year for pneumonia however Indea has not come back the way that she had previously after a day or two of antibiotics. [name] shares that over the last year, Kayson has gotten significantly weaker.  We talked about this in the greater context and how it is likely that at this time she does not have the reserve to bounce back the way she had before.  We discussed the difficulty of decisions moving forward.  We reviewed whether or not patient's family would like to try proning again to see if that  made any form of impact.  Patient's mother and father share that when this was done initially it was limited due to positioning and how each position potentially put Karsyn at risk for worsening outcome.  Patient's father also shares that this seems to been something that cause discomfort.  Patient's family do not want to pursue this measure again.  We discussed the limitations and options moving forward and how we are faced with rather difficult decisions in the context of how to proceed with care.  Patient's father shares they had been told throughout the duration of her illness that it is possible she would require intubation and they had avoided it for quite some time.  Patient's family understand that we are quite possibly looking at an end-of-life situation.  I was able to explain to them that when the focus of care transitions to more of a comfort mediated focus measures such as intubation, blood draws, diagnostic tests are all stopped.  The focus is purely on making sure symptoms are comprehensively managed until the individuals body proclaims it is time to move on. Created space and opportunity for patients family to explore thoughts feelings and fears regarding Lithzy's current medical situation.  Patient's family expressed understanding of the decisions that would need to be made.  We reviewed the importance of them talking to one another.  We reviewed the importance of having additional family and friends who love Trysten, and visit.  We reflected on the type of person Takerra is and how she would brighten anyone's day we came into contact with their.  We discussed how limiting Rett syndrome is and what little  could be offered to [facility] during her time suffering from this disorder.  Allowed time and space for reflection and offered support through therapeutic listening.  Patient's family is open to ongoing spiritual support.  Questions and concerns addressed/Palliative Support Provided.    Objective Assessment: Vital Signs Vitals:   11-21-2024 1030 November 21, 2024 1100  BP:    Pulse:  (!) 101  Resp:  (!) 26  Temp:    SpO2: 94% 96%    Intake/Output Summary (Last 24 hours) at 11-21-24 1137 Last data filed at 2024/11/21 1100 Gross per 24 hour  Intake 1128 ml  Output 1250 ml  Net -122 ml   Last Weight  Most recent update: 11-21-24  4:44 AM    Weight  50.1 kg (110 lb 7.2 oz)            Gen: Young Caucasian female chronically ill in appearance HEENT: ETT, dry mucous membranes CV: Regular rate and rhythm  PULM: On mechanical vent support ABD: Distended with gastrostomy tube in place EXT: Bilateral lower extremity foot drop, pedal edema Neuro: Somnolent  SUMMARY OF RECOMMENDATIONS   DNAR  Open and honest conversations held with patient's family regarding Khaleah's current health condition  Patient's family are understanding of the options moving forward --> They do not want to pursue proning again  Discussed comfort care and what that would entail in the hospital --> Patients family continue to discuss this  Ongoing spiritual support  The palliative care team will continue to follow along with Damien and her family during hospitalization ______________________________________________________________________________________ Rosaline [facility] [place] Palliative Medicine Team Team Cell Phone: [phone] Please utilize secure chat with additional questions, if there is no response within 30 minutes please call the above phone number  Time Spent: 56 Billing based on MDM: High  Palliative Medicine Team providers are available by phone from 7am to 7pm daily and can be reached through the team cell phone.  Should this patient require assistance outside of these hours, please call the patient's attending physician.     "

## 2024-11-17 NOTE — Plan of Care (Signed)
  Problem: Nutrition: Goal: Adequate nutrition will be maintained Outcome: Progressing   Problem: Coping: Goal: Level of anxiety will decrease Outcome: Progressing   Problem: Safety: Goal: Ability to remain free from injury will improve Outcome: Progressing   Problem: Skin Integrity: Goal: Risk for impaired skin integrity will decrease Outcome: Progressing   

## 2024-11-17 NOTE — IPAL (Signed)
" °  Interdisciplinary Goals of Care Family Meeting   Date carried out: 2024-12-03  Location of the meeting: Bedside  Member's involved: Physician, Bedside Registered Nurse, and Family Member or next of kin  Durable Power of Attorney or acting medical decision maker: Jo Matthews    Discussion: We discussed goals of care for Jo Matthews .  Jo Matthews states family has agreed to transition to comfort care. We discussed plan for medications to keep patient comfortable during this transition. Will ensure patient is comfortable before extubation.  Code status:   Code Status: Do not attempt resuscitation (DNR) - Comfort care   Disposition: In-patient comfort care  Time spent for the meeting: 10 min    Jo Matthews Staff, MD  December 03, 2024, 11:59 AM   "

## 2024-11-17 NOTE — Progress Notes (Signed)
 Chaplain consult requested by family.

## 2024-11-17 NOTE — Progress Notes (Signed)
 Pt belongings left behind.  Will call mother at number listed in chart [phone]) to ask if family would like to pick up belongings.

## 2024-11-17 DEATH — deceased

## 2024-11-18 NOTE — Telephone Encounter (Signed)
 Completed per office staffing. Closing encounter.
